# Patient Record
Sex: Female | Born: 1985
Health system: Southern US, Community
[De-identification: ages and names within clinical notes are randomized; demographics above are authoritative.]

## PROBLEM LIST (undated history)

## (undated) ENCOUNTER — Inpatient Hospital Stay (HOSPITAL_COMMUNITY): Payer: Self-pay

## (undated) DIAGNOSIS — B369 Superficial mycosis, unspecified: Secondary | ICD-10-CM

## (undated) DIAGNOSIS — I10 Essential (primary) hypertension: Secondary | ICD-10-CM

## (undated) DIAGNOSIS — K5909 Other constipation: Secondary | ICD-10-CM

## (undated) DIAGNOSIS — K219 Gastro-esophageal reflux disease without esophagitis: Secondary | ICD-10-CM

## (undated) DIAGNOSIS — N200 Calculus of kidney: Secondary | ICD-10-CM

## (undated) DIAGNOSIS — E119 Type 2 diabetes mellitus without complications: Secondary | ICD-10-CM

## (undated) DIAGNOSIS — O24419 Gestational diabetes mellitus in pregnancy, unspecified control: Secondary | ICD-10-CM

## (undated) DIAGNOSIS — H6240 Otitis externa in other diseases classified elsewhere, unspecified ear: Secondary | ICD-10-CM

## (undated) DIAGNOSIS — M2669 Other specified disorders of temporomandibular joint: Secondary | ICD-10-CM

## (undated) DIAGNOSIS — J45909 Unspecified asthma, uncomplicated: Secondary | ICD-10-CM

## (undated) HISTORY — DX: Gestational diabetes mellitus in pregnancy, unspecified control: O24.419

## (undated) HISTORY — DX: Superficial mycosis, unspecified: B36.9

## (undated) HISTORY — DX: Gastro-esophageal reflux disease without esophagitis: K21.9

## (undated) HISTORY — DX: Other specified disorders of temporomandibular joint: M26.69

## (undated) HISTORY — DX: Type 2 diabetes mellitus without complications: E11.9

## (undated) HISTORY — DX: Unspecified asthma, uncomplicated: J45.909

## (undated) HISTORY — DX: Calculus of kidney: N20.0

## (undated) HISTORY — DX: Superficial mycosis, unspecified: H62.40

## (undated) HISTORY — PX: NO PAST SURGERIES: SHX2092

## (undated) HISTORY — DX: Other constipation: K59.09

## (undated) HISTORY — PX: WISDOM TOOTH EXTRACTION: SHX21

---

## 2011-12-15 ENCOUNTER — Emergency Department (HOSPITAL_COMMUNITY)
Admission: EM | Admit: 2011-12-15 | Discharge: 2011-12-15 | Disposition: A | Discharge status: Home / Self Care | Payer: Medicaid Other | Attending: Emergency Medicine | Admitting: Emergency Medicine

## 2011-12-15 ENCOUNTER — Encounter (HOSPITAL_COMMUNITY): Payer: Self-pay | Admitting: Emergency Medicine

## 2011-12-15 DIAGNOSIS — Z3490 Encounter for supervision of normal pregnancy, unspecified, unspecified trimester: Secondary | ICD-10-CM

## 2011-12-15 DIAGNOSIS — O99330 Smoking (tobacco) complicating pregnancy, unspecified trimester: Secondary | ICD-10-CM | POA: Insufficient documentation

## 2011-12-15 DIAGNOSIS — O210 Mild hyperemesis gravidarum: Secondary | ICD-10-CM | POA: Insufficient documentation

## 2011-12-15 DIAGNOSIS — R29898 Other symptoms and signs involving the musculoskeletal system: Secondary | ICD-10-CM | POA: Insufficient documentation

## 2011-12-15 DIAGNOSIS — O99891 Other specified diseases and conditions complicating pregnancy: Secondary | ICD-10-CM | POA: Insufficient documentation

## 2011-12-15 LAB — PREGNANCY, URINE: Preg Test, Ur: POSITIVE — AB

## 2011-12-15 LAB — URINALYSIS, ROUTINE W REFLEX MICROSCOPIC
Ketones, ur: NEGATIVE mg/dL
Nitrite: NEGATIVE
Specific Gravity, Urine: 1.026 (ref 1.005–1.030)
pH: 6 (ref 5.0–8.0)

## 2011-12-15 LAB — URINE MICROSCOPIC-ADD ON

## 2011-12-15 LAB — GLUCOSE, CAPILLARY: Glucose-Capillary: 108 mg/dL — ABNORMAL HIGH (ref 70–99)

## 2011-12-15 NOTE — ED Notes (Signed)
Patient states that she is unable to wait any longer due to childcare issues.  Patient requesting results from urine and discharge.  RN Eme informed.

## 2011-12-15 NOTE — ED Notes (Addendum)
C/o L arm weakness, generalized body weakness, and nausea x 2 weeks.  Denies pain. Pt states she has been dropping things at work with L hand.  Grips strong and equal at present. No neuro deficits noted.

## 2011-12-15 NOTE — ED Notes (Signed)
Called for vitals and didn't answer

## 2011-12-15 NOTE — ED Notes (Signed)
Pt. Alert and oriented x4. Verbalized understanding of discharge instructions.  

## 2011-12-16 NOTE — ED Provider Notes (Signed)
History     CSN: [id number]  Arrival date & time 12/15/11  1629   First MD Initiated Contact with Patient 12/15/11 2201      Chief Complaint  Patient presents with  . Nausea  . Extremity Weakness    (Consider location/radiation/quality/duration/timing/severity/associated sxs/prior treatment) HPI Comments: Pt w no sig PMHx presents to ER c/o intermittent left arm numbness, morning nausea & fatigue w onset 2 weeks ago. Pt denies recent trauma, emesis, abdominal pain, weakness, syncope, fevers, night sweats or chills.   The history is provided by the patient.    History reviewed. No pertinent past medical history.  History reviewed. No pertinent past surgical history.  No family history on file.  History  Substance Use Topics  . Smoking status: Current Every Day Smoker  . Smokeless tobacco: Not on file  . Alcohol Use: No    OB History    Grav Para Term Preterm Abortions TAB SAB Ect Mult Living                  Review of Systems  All other systems reviewed and are negative.    Allergies  Penicillins  Home Medications  No current outpatient prescriptions on file.  BP 132/71  Pulse 79  Temp 98.5 F (36.9 C) (Oral)  Resp 18  SpO2 100%  LMP 10/31/2011  Physical Exam  Constitutional: She is oriented to person, place, and time. She appears well-developed and well-nourished. No distress.  HENT:  Head: Normocephalic and atraumatic.  Mouth/Throat: Oropharynx is clear and moist. No oropharyngeal exudate.  Eyes: Conjunctivae normal and EOM are normal. Pupils are equal, round, and reactive to light. No scleral icterus.  Neck: Normal range of motion. Neck supple. No tracheal deviation present. No thyromegaly present.  Cardiovascular: Normal rate, regular rhythm, normal heart sounds and intact distal pulses.   Pulmonary/Chest: Effort normal and breath sounds normal. No stridor. No respiratory distress. She has no wheezes.  Abdominal: Soft.  Musculoskeletal: Normal  range of motion. She exhibits no edema and no tenderness.  Neurological: She is alert and oriented to person, place, and time. Coordination normal.  Skin: Skin is warm and dry. No rash noted. She is not diaphoretic. No erythema. No pallor.  Psychiatric: She has a normal mood and affect. Her behavior is normal.    ED Course  Procedures (including critical care time)  Labs Reviewed  URINALYSIS, ROUTINE W REFLEX MICROSCOPIC - Abnormal; Notable for the following:    Leukocytes, UA SMALL (*)     All other components within normal limits  PREGNANCY, URINE - Abnormal; Notable for the following:    Preg Test, Ur POSITIVE (*)     All other components within normal limits  GLUCOSE, CAPILLARY - Abnormal; Notable for the following:    Glucose-Capillary 108 (*)     All other components within normal limits  URINE MICROSCOPIC-ADD ON - Abnormal; Notable for the following:    Squamous Epithelial / LPF FEW (*)     Bacteria, UA FEW (*)     All other components within normal limits   No results found.   1. Pregnancy       MDM  Pregnant, results discussed w pt. Advised OBGYN follow up.         Jaci Carrel, [place] 12/16/11 4098

## 2011-12-17 NOTE — ED Provider Notes (Signed)
Medical screening examination/treatment/procedure(s) were performed by non-physician practitioner and as supervising physician I was immediately available for consultation/collaboration.   Tyshawn Keel, MD 12/17/11 1532 

## 2011-12-24 ENCOUNTER — Encounter (HOSPITAL_COMMUNITY): Payer: Self-pay

## 2011-12-24 ENCOUNTER — Inpatient Hospital Stay (HOSPITAL_COMMUNITY): Payer: Medicaid Other

## 2011-12-24 ENCOUNTER — Inpatient Hospital Stay (HOSPITAL_COMMUNITY)
Admission: AD | Admit: 2011-12-24 | Discharge: 2011-12-24 | Disposition: A | Discharge status: Home / Self Care | Payer: Medicaid Other | Source: Ambulatory Visit | Attending: Obstetrics & Gynecology | Admitting: Obstetrics & Gynecology

## 2011-12-24 DIAGNOSIS — A499 Bacterial infection, unspecified: Secondary | ICD-10-CM | POA: Insufficient documentation

## 2011-12-24 DIAGNOSIS — O209 Hemorrhage in early pregnancy, unspecified: Secondary | ICD-10-CM

## 2011-12-24 DIAGNOSIS — O26859 Spotting complicating pregnancy, unspecified trimester: Secondary | ICD-10-CM | POA: Insufficient documentation

## 2011-12-24 DIAGNOSIS — O2390 Unspecified genitourinary tract infection in pregnancy, unspecified trimester: Secondary | ICD-10-CM | POA: Insufficient documentation

## 2011-12-24 DIAGNOSIS — B9689 Other specified bacterial agents as the cause of diseases classified elsewhere: Secondary | ICD-10-CM | POA: Insufficient documentation

## 2011-12-24 DIAGNOSIS — N760 Acute vaginitis: Secondary | ICD-10-CM

## 2011-12-24 LAB — CBC
MCV: 89.4 fL (ref 78.0–100.0)
Platelets: 388 10*3/uL (ref 150–400)
RBC: 4.61 MIL/uL (ref 3.87–5.11)
WBC: 7.9 10*3/uL (ref 4.0–10.5)

## 2011-12-24 LAB — URINALYSIS, ROUTINE W REFLEX MICROSCOPIC
Hgb urine dipstick: NEGATIVE
Nitrite: NEGATIVE
Specific Gravity, Urine: 1.02 (ref 1.005–1.030)
Urobilinogen, UA: 1 mg/dL (ref 0.0–1.0)

## 2011-12-24 LAB — WET PREP, GENITAL
Trich, Wet Prep: NONE SEEN
Yeast Wet Prep HPF POC: NONE SEEN

## 2011-12-24 LAB — ABO/RH: ABO/RH(D): O POS

## 2011-12-24 MED ORDER — METRONIDAZOLE 500 MG PO TABS: 500.0000 mg | ORAL_TABLET | Freq: Two times a day (BID) | ORAL | Status: DC

## 2011-12-24 NOTE — MAU Note (Signed)
Patient states she has been feeling weak and having vomiting for the past 2 days. Has had a positive pregnancy test at [facility] ED. States she had spotting last night but none today and is not wearing a pad. Denies any pain.

## 2011-12-24 NOTE — MAU Provider Note (Signed)
History     CSN: [id number]  Arrival date and time: 12/24/11 1333   None     Chief Complaint  Patient presents with  . Vomiting  . Fatigue   HPI  Pt is [redacted]w[redacted]d pregnant and presents with spotting in pregnancy last night.  She is  feeling weak and has been vomiting for 2 days. Pt had a positive UPT at [facility] ED on 12/16/2011.  Pt denies constipation, diarrhea, UTI symptoms. Pt is not having spotting or cramping at this time.  No past medical history on file.  No past surgical history on file.  No family history on file.  History  Substance Use Topics  . Smoking status: Current Every Day Smoker  . Smokeless tobacco: Not on file  . Alcohol Use: No    Allergies:  Allergies  Allergen Reactions  . Penicillins Hives and Swelling    No prescriptions prior to admission    ROS Physical Exam   Blood pressure 125/79, pulse 82, temperature 99.5 F (37.5 C), temperature source Oral, resp. rate 18, height 5\' 8"  (1.727 m), weight 122.653 kg (270 lb 6.4 oz), last menstrual period 10/12/2011, SpO2 100.00%.  Physical Exam  Nursing note and vitals reviewed. Constitutional: She is oriented to person, place, and time. She appears well-developed and well-nourished.  HENT:  Head: Normocephalic.  Eyes: Pupils are equal, round, and reactive to light.  Neck: Normal range of motion. Neck supple.  Cardiovascular: Normal rate.   Respiratory: Effort normal.  GI: Soft. She exhibits no distension. There is no tenderness. There is no rebound and no guarding.  Genitourinary:       Mod amount of frothy white discharge in vault; cervix clean nontender, closed; uterus and adnexal without tenderness or palpable enlargement - difficult to assess due to habitus  Musculoskeletal: Normal range of motion.  Neurological: She is alert and oriented to person, place, and time.  Skin: Skin is warm and dry.  Psychiatric: She has a normal mood and affect.    MAU Course  Procedures     Results for  orders placed during the hospital encounter of 12/24/11 (from the past 24 hour(s))  CBC     Status: Normal   Collection Time   12/24/11  1:55 PM      Component Value Range   WBC 7.9  4.0 - 10.5 K/uL   RBC 4.61  3.87 - 5.11 MIL/uL   Hemoglobin 14.1  12.0 - 15.0 g/dL   HCT 62.9  52.8 - 41.3 %   MCV 89.4  78.0 - 100.0 fL   MCH 30.6  26.0 - 34.0 pg   MCHC 34.2  30.0 - 36.0 g/dL   RDW 24.4  01.0 - 27.2 %   Platelets 388  150 - 400 K/uL  URINALYSIS, ROUTINE W REFLEX MICROSCOPIC     Status: Normal   Collection Time   12/24/11  2:00 PM      Component Value Range   Color, Urine YELLOW  YELLOW   APPearance CLEAR  CLEAR   Specific Gravity, Urine 1.020  1.005 - 1.030   pH 6.0  5.0 - 8.0   Glucose, UA NEGATIVE  NEGATIVE mg/dL   Hgb urine dipstick NEGATIVE  NEGATIVE   Bilirubin Urine NEGATIVE  NEGATIVE   Ketones, ur NEGATIVE  NEGATIVE mg/dL   Protein, ur NEGATIVE  NEGATIVE mg/dL   Urobilinogen, UA 1.0  0.0 - 1.0 mg/dL   Nitrite NEGATIVE  NEGATIVE   Leukocytes, UA NEGATIVE  NEGATIVE  ABO/RH     Status: Normal   Collection Time   12/24/11  2:00 PM      Component Value Range   ABO/RH(D) O POS    HCG, QUANTITATIVE, PREGNANCY     Status: Abnormal   Collection Time   12/24/11  2:00 PM      Component Value Range   hCG, Beta Chain, Quant, S 44196 (*) <5 mIU/mL  WET PREP, GENITAL     Status: Abnormal   Collection Time   12/24/11  4:59 PM      Component Value Range   Yeast Wet Prep HPF POC NONE SEEN  NONE SEEN   Trich, Wet Prep NONE SEEN  NONE SEEN   Clue Cells Wet Prep HPF POC MANY (*) NONE SEEN   WBC, Wet Prep HPF POC MANY (*) NONE SEEN    Assessment and Plan  Single living IUP [redacted]w[redacted]d Subchorionic hemorrhage Bleeding in pregnancy Bacterial vaginosis- prescription for Flagyl 500mg  BID for 7 days F/u for OB care with provider of choice  Shalon Councilman 12/24/2011, 4:12 PM

## 2012-06-13 ENCOUNTER — Emergency Department (HOSPITAL_COMMUNITY)
Admission: EM | Admit: 2012-06-13 | Discharge: 2012-06-13 | Disposition: A | Discharge status: Home / Self Care | Payer: Medicaid Other | Attending: Emergency Medicine | Admitting: Emergency Medicine

## 2012-06-13 ENCOUNTER — Emergency Department (HOSPITAL_COMMUNITY): Payer: Medicaid Other

## 2012-06-13 ENCOUNTER — Encounter (HOSPITAL_COMMUNITY): Payer: Self-pay | Admitting: *Deleted

## 2012-06-13 DIAGNOSIS — Z23 Encounter for immunization: Secondary | ICD-10-CM | POA: Insufficient documentation

## 2012-06-13 DIAGNOSIS — Y9389 Activity, other specified: Secondary | ICD-10-CM | POA: Insufficient documentation

## 2012-06-13 DIAGNOSIS — Y929 Unspecified place or not applicable: Secondary | ICD-10-CM | POA: Insufficient documentation

## 2012-06-13 DIAGNOSIS — S6991XA Unspecified injury of right wrist, hand and finger(s), initial encounter: Secondary | ICD-10-CM

## 2012-06-13 DIAGNOSIS — S6990XA Unspecified injury of unspecified wrist, hand and finger(s), initial encounter: Secondary | ICD-10-CM | POA: Insufficient documentation

## 2012-06-13 DIAGNOSIS — F17200 Nicotine dependence, unspecified, uncomplicated: Secondary | ICD-10-CM | POA: Insufficient documentation

## 2012-06-13 DIAGNOSIS — W268XXA Contact with other sharp object(s), not elsewhere classified, initial encounter: Secondary | ICD-10-CM | POA: Insufficient documentation

## 2012-06-13 MED ORDER — SULFAMETHOXAZOLE-TRIMETHOPRIM 800-160 MG PO TABS: 1.0000 | ORAL_TABLET | Freq: Two times a day (BID) | ORAL | Status: AC

## 2012-06-13 MED ORDER — TETANUS-DIPHTH-ACELL PERTUSSIS 5-2.5-18.5 LF-MCG/0.5 IM SUSP
0.5000 mL | Freq: Once | INTRAMUSCULAR | Status: AC
  Administered 2012-06-13: 0.5 mL via INTRAMUSCULAR
  Filled 2012-06-13: qty 0.5

## 2012-06-13 NOTE — ED Notes (Signed)
Patient transported to X-ray 

## 2012-06-13 NOTE — ED Provider Notes (Signed)
History     CSN: [id number]  Arrival date & time 06/13/12  2004   None     Chief Complaint  Patient presents with  . Hand Pain    (Consider location/radiation/quality/duration/timing/severity/associated sxs/prior treatment) HPI  Patient is a 27 yo F presenting for right middle finger swelling after cutting her finger on scrap metal three weeks ago. Patient states she cleaned out the cut and it appeared to be healing well until the other day when it became painful and swollen. Concerned for foreign body in finger. Patient denies decreased movement with finger, warmth, temperature change, numbness, tingling in the right middle finger. Denies previous injury to finger. Patient is not UTD for tetanus. Patient denies fevers, chills, headache, myalgias, nausea, vomiting, or diarrhea.    Past Medical History  Diagnosis Date  . No pertinent past medical history     Past Surgical History  Procedure Laterality Date  . No past surgeries      No family history on file.  History  Substance Use Topics  . Smoking status: Current Every Day Smoker -- 0.50 packs/day    Types: Cigarettes  . Smokeless tobacco: Not on file  . Alcohol Use: No    OB History   Grav Para Term Preterm Abortions TAB SAB Ect Mult Living   3 1 1  1 1    1       Review of Systems  Constitutional: Negative.   HENT: Negative.   Eyes: Negative.   Respiratory: Negative.   Cardiovascular: Negative.   Gastrointestinal: Negative.   Musculoskeletal: Negative.   Skin: Positive for wound.  Neurological: Negative.     Allergies  Penicillins  Home Medications   Current Outpatient Rx  Name  Route  Sig  Dispense  Refill  . acetaminophen (TYLENOL) 500 MG tablet   Oral   Take 1,000 mg by mouth every 6 (six) hours as needed for pain.           BP 121/71  Pulse 61  Temp(Src) 97.7 F (36.5 C) (Oral)  Resp 16  SpO2 100%  LMP 06/12/2012  Breastfeeding? Unknown  Physical Exam  Constitutional: She is  oriented to person, place, and time. She appears well-developed. No distress.  HENT:  Head: Normocephalic and atraumatic.  Eyes: Conjunctivae are normal.  Neck: Neck supple.  Cardiovascular: Normal rate, regular rhythm and normal heart sounds.   Pulses:      Radial pulses are 2+ on the right side, and 2+ on the left side.  Pulmonary/Chest: Effort normal and breath sounds normal.  Abdominal: Soft.  Musculoskeletal:  Right middle finger with minimal swelling, w/o erythema, no temperature difference between middle finger and rest of hand. Full ROM intact. Cap refill < 2 sec. No open wound.   Neurological: She is alert and oriented to person, place, and time.  Skin: Skin is warm and dry. She is not diaphoretic.    ED Course  Procedures (including critical care time) Medications  TDaP (BOOSTRIX) injection 0.5 mL (0.5 mLs Intramuscular Given 06/13/12 2109)     Labs Reviewed - No data to display Dg Finger Middle Right  06/13/2012  *RADIOLOGY REPORT*  Clinical Data: Middle finger pain  RIGHT MIDDLE FINGER 2+V  Comparison: None.  Findings: Three views of the right middle finger submitted.  No acute fracture or subluxation.  IMPRESSION: No acute fracture or subluxation.   Original Report Authenticated By: Natasha Mead, M.D.      1. Hand injury, right, initial encounter  MDM  Patient is a 27 yo F presenting for painful right middle finger after injuring finger three weeks ago cutting her finger on metal. Tetanus not UTD, corrected at this appointment. PE revealed no neurovascular compromise, no open wound, no erythema or drainage at site of wound. Pt started on Abx given MOI with scrap metal. Advised to follow up with PCP and hand surgeon. Patient agreeable to plan. Patient is stable at time of discharge         Jeannetta Ellis, PA-C 06/14/12 1022

## 2012-06-13 NOTE — ED Notes (Signed)
The pt cut her rt middle finger on a piece of metal 3 weeks ago.  She thinks there may be something in it .  Painful and swollen

## 2012-06-15 NOTE — ED Provider Notes (Signed)
Medical screening examination/treatment/procedure(s) were performed by non-physician practitioner and as supervising physician I was immediately available for consultation/collaboration.   Laray Anger, DO 06/15/12 1454

## 2012-06-26 ENCOUNTER — Emergency Department (HOSPITAL_COMMUNITY)
Admission: EM | Admit: 2012-06-26 | Discharge: 2012-06-26 | Disposition: A | Discharge status: Home / Self Care | Payer: Medicaid Other | Attending: Emergency Medicine | Admitting: Emergency Medicine

## 2012-06-26 ENCOUNTER — Encounter (HOSPITAL_COMMUNITY): Payer: Self-pay | Admitting: *Deleted

## 2012-06-26 DIAGNOSIS — R197 Diarrhea, unspecified: Secondary | ICD-10-CM | POA: Insufficient documentation

## 2012-06-26 DIAGNOSIS — F17200 Nicotine dependence, unspecified, uncomplicated: Secondary | ICD-10-CM | POA: Insufficient documentation

## 2012-06-26 DIAGNOSIS — J029 Acute pharyngitis, unspecified: Secondary | ICD-10-CM | POA: Insufficient documentation

## 2012-06-26 DIAGNOSIS — J069 Acute upper respiratory infection, unspecified: Secondary | ICD-10-CM | POA: Insufficient documentation

## 2012-06-26 DIAGNOSIS — R112 Nausea with vomiting, unspecified: Secondary | ICD-10-CM | POA: Insufficient documentation

## 2012-06-26 MED ORDER — IBUPROFEN 800 MG PO TABS: 800.0000 mg | ORAL_TABLET | Freq: Three times a day (TID) | ORAL | Status: DC

## 2012-06-26 MED ORDER — PROMETHAZINE HCL 25 MG PO TABS: ORAL_TABLET | ORAL | Status: DC

## 2012-06-26 NOTE — ED Provider Notes (Signed)
History    This chart was scribed for Junius Finner, PA working with Juliet Rude. Rubin Payor, MD by ED Scribe, Burman Nieves. This patient was seen in room TR07C/TR07C and the patient's care was started at 4:49 PM.  CSN: [id number]  Arrival date & time 06/26/12  1333   First MD Initiated Contact with Patient 06/26/12 1649      Chief Complaint  Patient presents with  . Cough    (Consider location/radiation/quality/duration/timing/severity/associated sxs/prior treatment) Patient is a 27 y.o. female presenting with cough. The history is provided by the patient. No language interpreter was used.  Cough  Amy Church is a 27 y.o. female who presents to the Emergency Department complaining of moderate intermittent productive cough with associated nasal and throat congestion, diarrhea, ear itching, and episodes of emesis onset 3 days ago. Pt states she has been vomiting phlegm that has a yellow appearance. She has been waking up at night "with the sweats". She states that eating triggers her episodes of emesis and can not recall how many times she has vomited. Pt states she has been taking Mucinex since sx's started with no immediate relief (Last dose was last night at 9PM). Pt denies fever, chills, SOB, weakness, and any other associated symptoms. Pt denies taking any daily medications and is allergic to penicillin. Pt is a current everyday tobacco smoker (0.5 pack/day).   Past Medical History  Diagnosis Date  . No pertinent past medical history     Past Surgical History  Procedure Laterality Date  . No past surgeries      History reviewed. No pertinent family history.  History  Substance Use Topics  . Smoking status: Current Every Day Smoker -- 0.50 packs/day    Types: Cigarettes  . Smokeless tobacco: Not on file  . Alcohol Use: No    OB History   Grav Para Term Preterm Abortions TAB SAB Ect Mult Living   3 1 1  1 1    1       Review of Systems  HENT: Positive for congestion.    Respiratory: Positive for cough.   Gastrointestinal: Positive for nausea and vomiting.  All other systems reviewed and are negative.    Allergies  Penicillins  Home Medications   Current Outpatient Rx  Name  Route  Sig  Dispense  Refill  . acetaminophen (TYLENOL) 500 MG tablet   Oral   Take 1,000 mg by mouth every 6 (six) hours as needed for pain.         Marland Kitchen dextromethorphan-guaiFENesin (MUCINEX DM) 30-600 MG per 12 hr tablet   Oral   Take 1 tablet by mouth every 12 (twelve) hours.         Marland Kitchen ibuprofen (ADVIL,MOTRIN) 800 MG tablet   Oral   Take 1 tablet (800 mg total) by mouth 3 (three) times daily.   21 tablet   0   . promethazine (PHENERGAN) 25 MG tablet      Take 1 tablet every 6-8hrs as needed for nausea.   30 tablet   0     BP 122/92  Pulse 80  Temp(Src) 98.3 F (36.8 C) (Oral)  Resp 18  SpO2 98%  LMP 06/26/2012  Breastfeeding? No  Physical Exam  Nursing note and vitals reviewed. Constitutional: She is oriented to person, place, and time. She appears well-developed and well-nourished. No distress.  HENT:  Head: Normocephalic and atraumatic.  Right Ear: External ear normal.  Left Ear: External ear normal.  Mouth/Throat: Uvula is midline  and mucous membranes are normal. No dental abscesses or edematous. Posterior oropharyngeal erythema present. No oropharyngeal exudate, posterior oropharyngeal edema or tonsillar abscesses.  Eyes: EOM are normal.  Neck: Neck supple. No tracheal deviation present.  Cardiovascular: Normal rate and normal heart sounds.   Pulmonary/Chest: Effort normal and breath sounds normal. No respiratory distress.  Abdominal: Soft. Bowel sounds are normal. She exhibits no mass. There is no tenderness. There is no guarding.  Musculoskeletal: Normal range of motion.  Neurological: She is alert and oriented to person, place, and time.  Skin: Skin is warm and dry.  Psychiatric: She has a normal mood and affect. Her behavior is normal.     ED Course  Procedures (including critical care time) DIAGNOSTIC STUDIES: Oxygen Saturation is 98% on room air, normal by my interpretation.    COORDINATION OF CARE: 5:09 PM Discussed ED treatment with pt and pt agrees.     Labs Reviewed - No data to display No results found.   1. Acute URI       MDM  Pt presented with URI symptoms x4-5 days.  Denies fever.  Reports loose stools and nausea.  Denied hx of asthma. PE: lungs CTAB. Rx: phenergan and ibuprofen.  Advised to f/u with PCP (contact info given) or [facility] for non-emergent healthcare concerns.    I personally performed the services described in this documentation, which was scribed in my presence. The recorded information has been reviewed and is accurate.         Junius Finner, PA-C 06/26/12 1742

## 2012-06-26 NOTE — ED Notes (Signed)
Pt states she has had a sore throat and head congestion since Friday, took Mucinex at home without relief.  Pt children have been sick with similar symptoms at home.  Pt also complaining of diarrhea.

## 2012-06-26 NOTE — ED Notes (Signed)
Pt states she has a cough, stuffy nose, throat and ear itching, vomiting since Friday. Mom has been taking mucinex and the last dose was last night at 2100. It did seem to help.

## 2012-06-27 NOTE — ED Provider Notes (Signed)
Medical screening examination/treatment/procedure(s) were performed by non-physician practitioner and as supervising physician I was immediately available for consultation/collaboration.  Ronesha Heenan R. Francine Hannan, MD 06/27/12 0017 

## 2012-08-16 ENCOUNTER — Emergency Department (HOSPITAL_COMMUNITY)
Admission: EM | Admit: 2012-08-16 | Discharge: 2012-08-17 | Disposition: A | Discharge status: Home / Self Care | Payer: Medicaid Other | Attending: Emergency Medicine | Admitting: Emergency Medicine

## 2012-08-16 ENCOUNTER — Encounter (HOSPITAL_COMMUNITY): Payer: Self-pay | Admitting: Family Medicine

## 2012-08-16 ENCOUNTER — Emergency Department (HOSPITAL_COMMUNITY): Payer: Medicaid Other

## 2012-08-16 DIAGNOSIS — M25561 Pain in right knee: Secondary | ICD-10-CM

## 2012-08-16 DIAGNOSIS — F17200 Nicotine dependence, unspecified, uncomplicated: Secondary | ICD-10-CM | POA: Insufficient documentation

## 2012-08-16 DIAGNOSIS — M25569 Pain in unspecified knee: Secondary | ICD-10-CM | POA: Insufficient documentation

## 2012-08-16 MED ORDER — OXYCODONE-ACETAMINOPHEN 5-325 MG PO TABS: 1.0000 | ORAL_TABLET | Freq: Four times a day (QID) | ORAL | Status: DC | PRN

## 2012-08-16 MED ORDER — IBUPROFEN 600 MG PO TABS: 600.0000 mg | ORAL_TABLET | Freq: Four times a day (QID) | ORAL | Status: DC | PRN

## 2012-08-16 NOTE — ED Provider Notes (Signed)
History     CSN: [id number]  Arrival date & time 08/16/12  1839   First MD Initiated Contact with Patient 08/16/12 2045      Chief Complaint  Patient presents with  . Knee Pain    (Consider location/radiation/quality/duration/timing/severity/associated sxs/prior treatment) Patient is a 27 y.o. female presenting with knee pain.  Knee Pain Associated symptoms: no back pain, no fatigue and no fever    patient presents with knee pain for the last several days. She states she previously had a bump on the inside of her right knee. She states that the bump was in the skin. She states that bump is now gone it has moved the pain to the front of her knee. Is worse with standing and walking. No trauma. No numbness or weakness. No other injury. No difficulty breathing. Congestion. No fevers. No cough.  Past Medical History  Diagnosis Date  . No pertinent past medical history     Past Surgical History  Procedure Laterality Date  . No past surgeries      History reviewed. No pertinent family history.  History  Substance Use Topics  . Smoking status: Current Every Day Smoker -- 0.50 packs/day    Types: Cigarettes  . Smokeless tobacco: Not on file  . Alcohol Use: No    OB History   Grav Para Term Preterm Abortions TAB SAB Ect Mult Living   3 1 1  1 1    1       Review of Systems  Constitutional: Negative for fever and fatigue.  Respiratory: Negative for cough.   Musculoskeletal: Negative for back pain, joint swelling, arthralgias and gait problem.  Skin: Negative for rash and wound.    Allergies  Penicillins  Home Medications   Current Outpatient Rx  Name  Route  Sig  Dispense  Refill  . medroxyPROGESTERone (DEPO-PROVERA) 150 MG/ML injection   Intramuscular   Inject 150 mg into the muscle every 3 (three) months.         Marland Kitchen ibuprofen (ADVIL,MOTRIN) 600 MG tablet   Oral   Take 1 tablet (600 mg total) by mouth every 6 (six) hours as needed for pain.   20 tablet   0    . oxyCODONE-acetaminophen (PERCOCET/ROXICET) 5-325 MG per tablet   Oral   Take 1-2 tablets by mouth every 6 (six) hours as needed for pain.   10 tablet   0     BP 119/73  Pulse 82  Temp(Src) 97.9 F (36.6 C) (Oral)  Resp 16  SpO2 100%  LMP 10/12/2011  Physical Exam  Constitutional: She is oriented to person, place, and time. She appears well-developed and well-nourished.  Patient is obese  HENT:  Head: Atraumatic.  Eyes: Pupils are equal, round, and reactive to light.  Neck: Neck supple.  Pulmonary/Chest: Effort normal and breath sounds normal.  Abdominal: Soft.  Musculoskeletal: She exhibits tenderness.  Tenderness over lower end of right patella. No erythema. No induration. No effusion. Neurovascular intact distally. Strength intact at knee.  Neurological: She is alert and oriented to person, place, and time.  Skin: Skin is warm. No erythema.    ED Course  Procedures (including critical care time)  Labs Reviewed - No data to display Dg Knee Complete 4 Views Right  08/16/2012   *RADIOLOGY REPORT*  Clinical Data: Right-sided knee pain.  RIGHT KNEE - COMPLETE 4+ VIEW  Comparison: No priors.  Findings: Four views of the right knee demonstrate no acute displaced fracture, subluxation, dislocation, joint  or soft tissue abnormality.  IMPRESSION: 1.  No acute radiographic abnormality of the right knee.   Original Report Authenticated By: Trudie Reed, M.D.     1. Knee pain, acute, right       MDM  Patient with knee pain. Doubt DVT. X-ray reassuring. Will followup with Ortho as needed        Juliet Rude. Rubin Payor, MD 08/17/12 [phone]

## 2012-08-16 NOTE — ED Notes (Signed)
Pt pt sts knot in thigh that has moved to knee. sts very painful. sts knot is on the inside.

## 2012-10-21 ENCOUNTER — Encounter (HOSPITAL_COMMUNITY): Payer: Self-pay | Admitting: *Deleted

## 2012-10-21 DIAGNOSIS — Z79899 Other long term (current) drug therapy: Secondary | ICD-10-CM | POA: Insufficient documentation

## 2012-10-21 DIAGNOSIS — I10 Essential (primary) hypertension: Secondary | ICD-10-CM | POA: Insufficient documentation

## 2012-10-21 DIAGNOSIS — F17200 Nicotine dependence, unspecified, uncomplicated: Secondary | ICD-10-CM | POA: Insufficient documentation

## 2012-10-21 DIAGNOSIS — R51 Headache: Secondary | ICD-10-CM | POA: Insufficient documentation

## 2012-10-21 NOTE — ED Notes (Signed)
Pt in c/o high blood pressure and dizziness x1 week with headache, states she does not have a history of hypertension but when she was having the dizziness and headache she started checking her BP at home and it has been elevated all week

## 2012-10-22 ENCOUNTER — Emergency Department (HOSPITAL_COMMUNITY)
Admission: EM | Admit: 2012-10-22 | Discharge: 2012-10-22 | Disposition: A | Discharge status: Home / Self Care | Payer: Medicaid Other | Attending: Emergency Medicine | Admitting: Emergency Medicine

## 2012-10-22 LAB — POCT I-STAT, CHEM 8
Calcium, Ion: 1.15 mmol/L (ref 1.12–1.23)
Chloride: 105 mEq/L (ref 96–112)
Glucose, Bld: 123 mg/dL — ABNORMAL HIGH (ref 70–99)
HCT: 43 % (ref 36.0–46.0)
Hemoglobin: 14.6 g/dL (ref 12.0–15.0)
TCO2: 24 mmol/L (ref 0–100)

## 2012-10-22 MED ORDER — DEXAMETHASONE SODIUM PHOSPHATE 4 MG/ML IJ SOLN
10.0000 mg | Freq: Once | INTRAMUSCULAR | Status: DC
  Filled 2012-10-22: qty 3

## 2012-10-22 MED ORDER — SODIUM CHLORIDE 0.9 % IV BOLUS (SEPSIS)
1000.0000 mL | Freq: Once | INTRAVENOUS | Status: AC
  Administered 2012-10-22: 1000 mL via INTRAVENOUS

## 2012-10-22 MED ORDER — METOCLOPRAMIDE HCL 5 MG/ML IJ SOLN
10.0000 mg | Freq: Once | INTRAMUSCULAR | Status: DC
  Filled 2012-10-22: qty 2

## 2012-10-22 MED ORDER — DIPHENHYDRAMINE HCL 50 MG/ML IJ SOLN
25.0000 mg | Freq: Once | INTRAMUSCULAR | Status: DC
  Filled 2012-10-22: qty 1

## 2012-10-22 MED ORDER — IBUPROFEN 800 MG PO TABS: 800.0000 mg | ORAL_TABLET | Freq: Three times a day (TID) | ORAL | Status: DC

## 2012-10-22 MED ORDER — ACETAMINOPHEN 325 MG PO TABS
650.0000 mg | ORAL_TABLET | Freq: Once | ORAL | Status: AC
  Administered 2012-10-22: 650 mg via ORAL
  Filled 2012-10-22: qty 2

## 2012-10-22 NOTE — Discharge Instructions (Signed)
General Headache Without Cause A headache is pain or discomfort felt around the head or neck area. The specific cause of a headache may not be found. There are many causes and types of headaches. A few common ones are:  Tension headaches.  Migraine headaches.  Cluster headaches.  Chronic daily headaches. HOME CARE INSTRUCTIONS   Keep all follow-up appointments with your caregiver or any specialist referral.  Only take over-the-counter or prescription medicines for pain or discomfort as directed by your caregiver.  Lie down in a dark, quiet room when you have a headache.  Keep a headache journal to find out what may trigger your migraine headaches. For example, write down:  What you eat and drink.  How much sleep you get.  Any change to your diet or medicines.  Try massage or other relaxation techniques.  Put ice packs or heat on the head and neck. Use these 3 to 4 times per day for 15 to 20 minutes each time, or as needed.  Limit stress.  Sit up straight, and do not tense your muscles.  Quit smoking if you smoke.  Limit alcohol use.  Decrease the amount of caffeine you drink, or stop drinking caffeine.  Eat and sleep on a regular schedule.  Get 7 to 9 hours of sleep, or as recommended by your caregiver.  Keep lights dim if bright lights bother you and make your headaches worse. SEEK MEDICAL CARE IF:   You have problems with the medicines you were prescribed.  Your medicines are not working.  You have a change from the usual headache.  You have nausea or vomiting. SEEK IMMEDIATE MEDICAL CARE IF:   Your headache becomes severe.  You have a fever.  You have a stiff neck.  You have loss of vision.  You have muscular weakness or loss of muscle control.  You start losing your balance or have trouble walking.  You feel faint or pass out.  You have severe symptoms that are different from your first symptoms. MAKE SURE YOU:   Understand these  instructions.  Will watch your condition.  Will get help right away if you are not doing well or get worse. Document Released: 02/15/2005 Document Revised: 05/10/2011 Document Reviewed: 03/03/2011 [employer] Patient Information 2014 [employer].  

## 2012-10-22 NOTE — ED Notes (Signed)
Pt states she had left sided CP and SOB when she came into the ER but now it has gone away and currently denies CP at this moment.

## 2012-10-22 NOTE — ED Provider Notes (Signed)
CSN: [id number]     Arrival date & time 10/21/12  2136 History     First MD Initiated Contact with Patient 10/22/12 0041     Chief Complaint  Patient presents with  . Hypertension  . Chest Pain   (Consider location/radiation/quality/duration/timing/severity/associated sxs/prior Treatment) HPI Hx per PT  - elevated BP at home for the last week.  She has been very anxious about this, it causes her to get HAs and sometimes sharp CP and SOB/ anxiety. Right now she has some HA but denies any CP or SOB. No ABD pain, no trouble urinating. np PCP and no h/o HTN. No leg pain or swelling.   HAs are frontal, she gets them daily, LMP months ago - is on depo Past Medical History  Diagnosis Date  . No pertinent past medical history    Past Surgical History  Procedure Laterality Date  . No past surgeries     History reviewed. No pertinent family history. History  Substance Use Topics  . Smoking status: Current Every Day Smoker -- 0.50 packs/day    Types: Cigarettes  . Smokeless tobacco: Not on file  . Alcohol Use: No   OB History   Grav Para Term Preterm Abortions TAB SAB Ect Mult Living   3 1 1  1 1    1      Review of Systems  Constitutional: Negative for fever and chills.  HENT: Negative for neck pain and neck stiffness.   Eyes: Negative for visual disturbance.  Respiratory: Negative for cough and wheezing.   Cardiovascular: Positive for chest pain.  Gastrointestinal: Negative for abdominal pain.  Genitourinary: Negative for dysuria.  Musculoskeletal: Negative for back pain.  Skin: Negative for rash.  Neurological: Positive for headaches. Negative for weakness and numbness.  All other systems reviewed and are negative.    Allergies  Penicillins  Home Medications   Current Outpatient Rx  Name  Route  Sig  Dispense  Refill  . Lurasidone HCl (LATUDA) 20 MG TABS   Oral   Take 10 mg by mouth daily.         . medroxyPROGESTERone (DEPO-PROVERA) 150 MG/ML injection  Intramuscular   Inject 150 mg into the muscle every 3 (three) months.          BP 156/109  Pulse 101  Temp(Src) 98 F (36.7 C) (Oral)  Resp 20  Wt 270 lb (122.471 kg)  BMI 41.06 kg/m2  SpO2 100%  LMP 10/12/2011 Physical Exam  Constitutional: She is oriented to person, place, and time. She appears well-developed and well-nourished.  HENT:  Head: Normocephalic and atraumatic.  Eyes: Conjunctivae and EOM are normal. Pupils are equal, round, and reactive to light.  Neck: Normal range of motion. Neck supple.  Cardiovascular: Regular rhythm and intact distal pulses.   Borderline tachycardic  Pulmonary/Chest: Effort normal and breath sounds normal. No stridor. No respiratory distress. She exhibits no tenderness.  Abdominal: Soft. Bowel sounds are normal. She exhibits no distension. There is no tenderness.  Musculoskeletal: Normal range of motion. She exhibits no edema.  Neurological: She is alert and oriented to person, place, and time. She displays normal reflexes. No cranial nerve deficit. She exhibits normal muscle tone. Coordination normal.  Skin: Skin is warm and dry.    ED Course   Procedures (including critical care time)  Results for orders placed during the hospital encounter of 10/22/12  D-DIMER, QUANTITATIVE      Result Value Range   D-Dimer, Quant <0.27  0.00 -  0.48 ug/mL-FEU  POCT I-STAT, CHEM 8      Result Value Range   Sodium 141  135 - 145 mEq/L   Potassium 3.5  3.5 - 5.1 mEq/L   Chloride 105  96 - 112 mEq/L   BUN 13  6 - 23 mg/dL   Creatinine, Ser 1.61  0.50 - 1.10 mg/dL   Glucose, Bld 096 (*) 70 - 99 mg/dL   Calcium, Ion 0.45  4.09 - 1.23 mmol/L   TCO2 24  0 - 100 mmol/L   Hemoglobin 14.6  12.0 - 15.0 g/dL   HCT 81.1  91.4 - 78.2 %  POCT I-STAT TROPONIN I      Result Value Range   Troponin i, poc 0.01  0.00 - 0.08 ng/mL   Comment 3                Date: 10/22/2012  Rate: 95  Rhythm: normal sinus rhythm  QRS Axis: normal  Intervals: normal  ST/T  Wave abnormalities: nonspecific ST changes  Conduction Disutrbances:none  Narrative Interpretation:   Old EKG Reviewed: none available  Filed Vitals:   10/22/12 0116  BP: 118/80  Pulse: 102  Temp:   Resp:    Tachycardia otherwise normal exam without neuro deficits or indication for emergent imaging of brain. Labs reviewed as above. Negative d-dimer  I offered patient an IV fluid bolus and IV headache cocktail and she declined, prefers Tylenol  2:23 AM on recheck headache is improved. She has not had any chest pain or difficulty breathing in the ED. Blood pressure improved. No indication for further workup at this time in the emergency department.  Plan discharge home with outpatient followup. Patient agrees to return precautions and followup primary care physician.  MDM  Elevated blood pressure at home with multiple other complaints Evaluated with EKG and labs Tylenol provided Condition improved Vital signs and nursing notes reviewed and considered   Sunnie Nielsen, MD 10/22/12 0225

## 2013-12-31 ENCOUNTER — Encounter (HOSPITAL_COMMUNITY): Payer: Self-pay | Admitting: *Deleted

## 2014-04-29 ENCOUNTER — Emergency Department (HOSPITAL_COMMUNITY)
Admission: EM | Admit: 2014-04-29 | Discharge: 2014-04-29 | Disposition: A | Discharge status: Home / Self Care | Payer: Medicaid Other | Attending: Emergency Medicine | Admitting: Emergency Medicine

## 2014-04-29 ENCOUNTER — Encounter (HOSPITAL_COMMUNITY): Payer: Self-pay | Admitting: *Deleted

## 2014-04-29 DIAGNOSIS — Z791 Long term (current) use of non-steroidal anti-inflammatories (NSAID): Secondary | ICD-10-CM | POA: Diagnosis not present

## 2014-04-29 DIAGNOSIS — N61 Inflammatory disorders of breast: Secondary | ICD-10-CM | POA: Insufficient documentation

## 2014-04-29 DIAGNOSIS — Z720 Tobacco use: Secondary | ICD-10-CM | POA: Insufficient documentation

## 2014-04-29 DIAGNOSIS — N611 Abscess of the breast and nipple: Secondary | ICD-10-CM

## 2014-04-29 DIAGNOSIS — Z79899 Other long term (current) drug therapy: Secondary | ICD-10-CM | POA: Diagnosis not present

## 2014-04-29 DIAGNOSIS — Z8614 Personal history of Methicillin resistant Staphylococcus aureus infection: Secondary | ICD-10-CM | POA: Insufficient documentation

## 2014-04-29 DIAGNOSIS — Z0189 Encounter for other specified special examinations: Secondary | ICD-10-CM

## 2014-04-29 DIAGNOSIS — Z7689 Persons encountering health services in other specified circumstances: Secondary | ICD-10-CM

## 2014-04-29 DIAGNOSIS — Z880 Allergy status to penicillin: Secondary | ICD-10-CM | POA: Diagnosis not present

## 2014-04-29 MED ORDER — DOXYCYCLINE HYCLATE 100 MG PO CAPS: 100.0000 mg | ORAL_CAPSULE | Freq: Two times a day (BID) | ORAL | Status: DC

## 2014-04-29 MED ORDER — OXYCODONE-ACETAMINOPHEN 5-325 MG PO TABS: 1.0000 | ORAL_TABLET | ORAL | Status: DC | PRN

## 2014-04-29 MED ORDER — OXYCODONE-ACETAMINOPHEN 5-325 MG PO TABS: 2.0000 | ORAL_TABLET | ORAL | Status: DC | PRN

## 2014-04-29 MED ORDER — LIDOCAINE-EPINEPHRINE (PF) 2 %-1:200000 IJ SOLN
10.0000 mL | Freq: Once | INTRAMUSCULAR | Status: AC
  Administered 2014-04-29: 10 mL
  Filled 2014-04-29: qty 20

## 2014-04-29 NOTE — ED Provider Notes (Addendum)
CSN: [id number]     Arrival date & time 04/29/14  4098 History   First MD Initiated Contact with Patient 04/29/14 0745     Chief Complaint  Patient presents with  . Abscess     (Consider location/radiation/quality/duration/timing/severity/associated sxs/prior Treatment) Patient is a 29 y.o. female presenting with abscess.  Abscess  29 year old female coming in today complaining of area of tenderness and swelling under left breast that has been there for 3 weeks. It is slightly larger and more painful now. She feels like it should be draining but she has not noted any drainage. She also has a bump on her left inner thigh dysmenorrhea for 6 months. She states she did have MRSA in the past and she was coming in because she was concerned this was MRSA again. She is unable tell me the date or how she was treated. She does say that she completed her medications. She has not had fever, chills, trauma, weight loss or weight gain. Past Medical History  Diagnosis Date  . No pertinent past medical history    Past Surgical History  Procedure Laterality Date  . No past surgeries     No family history on file. History  Substance Use Topics  . Smoking status: Current Every Day Smoker -- 0.50 packs/day    Types: Cigarettes  . Smokeless tobacco: Not on file  . Alcohol Use: No   OB History    Gravida Para Term Preterm AB TAB SAB Ectopic Multiple Living   Review of Systems  All other systems reviewed and are negative.     Allergies  Penicillins  Home Medications   Prior to Admission medications   Medication Sig Start Date End Date Taking? Authorizing Provider  ibuprofen (ADVIL,MOTRIN) 800 MG tablet Take 1 tablet (800 mg total) by mouth 3 (three) times daily. 10/22/12   Sunnie Nielsen, MD  Lurasidone HCl (LATUDA) 20 MG TABS Take 10 mg by mouth daily.    Historical Provider, MD  medroxyPROGESTERone (DEPO-PROVERA) 150 MG/ML injection Inject 150 mg into the muscle every 3  (three) months.    Historical Provider, MD   BP 123/90 mmHg  Pulse 86  Temp(Src) 97.9 F (36.6 C) (Oral)  Resp 18  Ht  (1.702 m)  SpO2 99%  LMP 04/01/2014 Physical Exam  Constitutional: She is oriented to person, place, and time. She appears well-developed.  Morbidly obese female sitting in bed in no acute distress  HENT:  Head: Normocephalic.  Neck: Normal range of motion.  Cardiovascular: Normal rate.   Pulmonary/Chest: Effort normal.    2x3 cm fluctuant area under left breast with some erythema and mild tendernes  Abdominal: Soft.  Musculoskeletal: Normal range of motion.  Neurological: She is alert and oriented to person, place, and time.  Skin: Skin is warm and dry.     Psychiatric: She has a normal mood and affect.  Nursing note and vitals reviewed.   ED Course  INCISION AND DRAINAGE Date/Time: 04/29/2014 8:11 AM Performed by: Hilario Quarry Authorized by: Hilario Quarry Consent: Verbal consent obtained. Risks and benefits: risks, benefits and alternatives were discussed Consent given by: patient Patient identity confirmed: verbally with patient Time out: Immediately prior to procedure a "time out" was called to verify the correct patient, procedure, equipment, support staff and site/side marked as required. Type: abscess Location: left breast. Anesthesia: local infiltration Local anesthetic: lidocaine 1% with epinephrine Anesthetic total:  3 ml Patient sedated: no Scalpel size: 11 Needle gauge: 22 Incision type: single straight (probed to break up loculations) Complexity: complex Drainage: purulent Drainage amount: moderate Wound treatment: wound left open Patient tolerance: Patient tolerated the procedure well with no immediate complications   (including critical care time)   MDM   Final diagnoses:  Abscess of left breast  Encounter for incision and drainage procedure    Patient with left breast abscess with i and d done.  She also has  left inguinal indurated area without fluctuance.  Given history of mrsa and lesdon without ability to i and d now, will also treat with clindamycin.  Discussed need for follow up and return precautions.     Hilario Quarryanielle S Richa Shor, MD 04/29/14 [id number]  Hilario Quarryanielle S Nyana Haren, MD 04/29/14 [phone]

## 2014-04-29 NOTE — ED Notes (Signed)
Pt states abscess to L breast x 3 weeks and 1 to L inner thigh x 6 months.  No drainage noted.

## 2014-04-29 NOTE — Discharge Instructions (Signed)

## 2014-12-03 ENCOUNTER — Emergency Department (HOSPITAL_COMMUNITY): Payer: Medicaid Other

## 2014-12-03 ENCOUNTER — Emergency Department (HOSPITAL_COMMUNITY)
Admission: EM | Admit: 2014-12-03 | Discharge: 2014-12-03 | Disposition: A | Discharge status: Home / Self Care | Payer: Medicaid Other | Attending: Emergency Medicine | Admitting: Emergency Medicine

## 2014-12-03 ENCOUNTER — Encounter (HOSPITAL_COMMUNITY): Payer: Self-pay | Admitting: Emergency Medicine

## 2014-12-03 DIAGNOSIS — Z3202 Encounter for pregnancy test, result negative: Secondary | ICD-10-CM | POA: Diagnosis not present

## 2014-12-03 DIAGNOSIS — Z793 Long term (current) use of hormonal contraceptives: Secondary | ICD-10-CM | POA: Diagnosis not present

## 2014-12-03 DIAGNOSIS — R319 Hematuria, unspecified: Secondary | ICD-10-CM | POA: Insufficient documentation

## 2014-12-03 DIAGNOSIS — S3992XA Unspecified injury of lower back, initial encounter: Secondary | ICD-10-CM | POA: Diagnosis present

## 2014-12-03 DIAGNOSIS — Z792 Long term (current) use of antibiotics: Secondary | ICD-10-CM | POA: Insufficient documentation

## 2014-12-03 DIAGNOSIS — Y9389 Activity, other specified: Secondary | ICD-10-CM | POA: Insufficient documentation

## 2014-12-03 DIAGNOSIS — R102 Pelvic and perineal pain: Secondary | ICD-10-CM | POA: Diagnosis not present

## 2014-12-03 DIAGNOSIS — R109 Unspecified abdominal pain: Secondary | ICD-10-CM | POA: Diagnosis not present

## 2014-12-03 DIAGNOSIS — Y9289 Other specified places as the place of occurrence of the external cause: Secondary | ICD-10-CM | POA: Diagnosis not present

## 2014-12-03 DIAGNOSIS — S39012A Strain of muscle, fascia and tendon of lower back, initial encounter: Secondary | ICD-10-CM | POA: Diagnosis not present

## 2014-12-03 DIAGNOSIS — R300 Dysuria: Secondary | ICD-10-CM | POA: Diagnosis not present

## 2014-12-03 DIAGNOSIS — Z880 Allergy status to penicillin: Secondary | ICD-10-CM | POA: Diagnosis not present

## 2014-12-03 DIAGNOSIS — M549 Dorsalgia, unspecified: Secondary | ICD-10-CM

## 2014-12-03 DIAGNOSIS — Y998 Other external cause status: Secondary | ICD-10-CM | POA: Diagnosis not present

## 2014-12-03 DIAGNOSIS — X58XXXA Exposure to other specified factors, initial encounter: Secondary | ICD-10-CM | POA: Diagnosis not present

## 2014-12-03 DIAGNOSIS — Z720 Tobacco use: Secondary | ICD-10-CM | POA: Diagnosis not present

## 2014-12-03 DIAGNOSIS — Z79899 Other long term (current) drug therapy: Secondary | ICD-10-CM | POA: Insufficient documentation

## 2014-12-03 DIAGNOSIS — Z791 Long term (current) use of non-steroidal anti-inflammatories (NSAID): Secondary | ICD-10-CM | POA: Diagnosis not present

## 2014-12-03 DIAGNOSIS — R110 Nausea: Secondary | ICD-10-CM | POA: Insufficient documentation

## 2014-12-03 LAB — URINALYSIS, ROUTINE W REFLEX MICROSCOPIC
Glucose, UA: NEGATIVE mg/dL
Ketones, ur: NEGATIVE mg/dL
LEUKOCYTES UA: NEGATIVE
NITRITE: NEGATIVE
PH: 6 (ref 5.0–8.0)
Protein, ur: NEGATIVE mg/dL
SPECIFIC GRAVITY, URINE: 1.023 (ref 1.005–1.030)
Urobilinogen, UA: 0.2 mg/dL (ref 0.0–1.0)

## 2014-12-03 LAB — URINE MICROSCOPIC-ADD ON

## 2014-12-03 LAB — I-STAT BETA HCG BLOOD, ED (MC, WL, AP ONLY): I-stat hCG, quantitative: <5 m[IU]/mL (ref <5)

## 2014-12-03 MED ORDER — OXYCODONE-ACETAMINOPHEN 5-325 MG PO TABS: 1.0000 | ORAL_TABLET | ORAL | Status: DC | PRN

## 2014-12-03 NOTE — ED Notes (Signed)
Pt c/o bilateral lower back pain. Denies previous injury/surgery/trauma. Says, "my back started hurting and it felt like I needed to poop/piss all at the same time, like something needed to come out." It feels like a heaviness down there every time I need to use the bathroom. No other c/c. Took two Goody powders before coming via EMS.

## 2014-12-03 NOTE — ED Notes (Signed)
Per EMS, patient from home with c/o left lumbar pain and vaginal pressure. Patient unsure if she could be pregnant, states she feels like she needs to push. Patient reports last menstrual cycle two months ago and that she has a hx of being irregular. Patient arrived to triage tearful, was able to ambulate unassisted to wheelchair. Upon being placed in lobby patient was no longer crying.

## 2014-12-03 NOTE — ED Provider Notes (Signed)
CSN: [id number]     Arrival date & time 12/03/14  1953 History  By signing my name below, I, Amy Church, attest that this documentation has been prepared under the direction and in the presence of Antony Madura, PA-C  Electronically Signed: Gwenyth Church, ED Scribe. 12/03/2014. 9:04 PM.   Chief Complaint  Patient presents with  . Back Pain    left lower   The history is provided by the patient. No language interpreter was used.    HPI Comments: Amy Church is a 29 y.o. female BIBA who presents to the Emergency Department complaining of moderate vaginal pain that started earlier today. Pt states mild dysuria, nausea and hematuria as associated symptoms. She also notes generalized lower back pain that started 1 week ago, improved with Epsom soaks and rest, and became worse today. Pt reports suprapubic pressure and one episode of pain radiating from her vagina to her anus today while trying to urinate and pass stool. She states that she felt the urge to urinate and have BM, but that no urine or stool was passed during the episode. Pt took 3-4 Goody Powders PTA with some relief. 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 Pt denies a history of a kidney stones or back surgeries. She also denies bladder or bowel incontinence, vaginal discharge, fever, vomiting and numbness in her legs as associated symptoms.  Past Medical History  Diagnosis Date  . No pertinent past medical history    Past Surgical History  Procedure Laterality Date  . No past surgeries     History reviewed. No pertinent family history. Social History  Substance Use Topics  . Smoking status: Current Every Day Smoker -- 0.50 packs/day    Types: Cigarettes  . Smokeless tobacco: None  . Alcohol Use: No   OB History    Gravida Para Term Preterm AB TAB SAB Ectopic Multiple Living   Review of Systems  Gastrointestinal: Positive for nausea and abdominal pain.  Genitourinary: Positive for  dysuria, hematuria and vaginal pain.  Musculoskeletal: Positive for back pain.  All other systems reviewed and are negative.  Allergies  Penicillins  Home Medications   Prior to Admission medications   Medication Sig Start Date End Date Taking? Authorizing Provider  doxycycline (VIBRAMYCIN) 100 MG capsule Take 1 capsule (100 mg total) by mouth 2 (two) times daily. 04/29/14   Garlon Hatchet, PA-C  ibuprofen (ADVIL,MOTRIN) 800 MG tablet Take 1 tablet (800 mg total) by mouth 3 (three) times daily. 10/22/12   Sunnie Nielsen, MD  Lurasidone HCl (LATUDA) 20 MG TABS Take 10 mg by mouth daily.    Historical Provider, MD  medroxyPROGESTERone (DEPO-PROVERA) 150 MG/ML injection Inject 150 mg into the muscle every 3 (three) months.    Historical Provider, MD  oxyCODONE-acetaminophen (PERCOCET/ROXICET) 5-325 MG tablet Take 1 tablet by mouth every 4 (four) hours as needed for moderate pain or severe pain. 12/03/14   Antony Madura, PA-C   BP 138/88 mmHg  Pulse 88  Temp(Src) 98.1 F (36.7 C) (Oral)  Resp 16  SpO2 99%  LMP 11/19/2014 (Approximate)   Physical Exam  Constitutional: She is oriented to person, place, and time. She appears well-developed and well-nourished. No distress.  Patient laughing and joking with family.  HENT:  Head: Normocephalic and atraumatic.  Eyes: Conjunctivae and EOM are normal. No scleral icterus.  Neck: Normal range of motion.  Cardiovascular: Normal  rate, regular rhythm and intact distal pulses.   DP and PT pulses 2+ b/l  Pulmonary/Chest: Effort normal. No respiratory distress.  Respirations even and unlabored  Abdominal: Soft. She exhibits no distension.  Soft, obese, nondistended  Musculoskeletal: Normal range of motion. She exhibits tenderness.       Thoracic back: Normal.       Lumbar back: She exhibits tenderness and pain. She exhibits normal range of motion, no swelling, no deformity and no spasm.       Back:  Negative straight leg raise and crossed straight leg  raise  Neurological: She is alert and oriented to person, place, and time. She exhibits normal muscle tone. Coordination normal.  Sensation to light touch intact in all extremities. Patient ambulatory with steady gait.  Skin: Skin is warm and dry. No rash noted. She is not diaphoretic. No erythema. No pallor.  Psychiatric: She has a normal mood and affect. Her behavior is normal.  Nursing note and vitals reviewed.   ED Course  Procedures   DIAGNOSTIC STUDIES: Oxygen Saturation is 100% on RA, normal by my interpretation.    COORDINATION OF CARE: 9:03 PM Discussed treatment plan with pt which includes UA. Pt agreed to plan.  Labs Review Labs Reviewed  URINALYSIS, ROUTINE W REFLEX MICROSCOPIC (NOT AT [facility]) - Abnormal; Notable for the following:    APPearance CLOUDY (*)    Hgb urine dipstick LARGE (*)    Bilirubin Urine SMALL (*)    All other components within normal limits  URINE CULTURE  URINE MICROSCOPIC-ADD ON  I-STAT BETA HCG BLOOD, ED ([facility], [facility], [facility] ONLY)    Imaging Review Ct Renal Stone Study  12/03/2014   CLINICAL DATA:  Bilateral low back pain.  Hematuria.  EXAM: CT ABDOMEN AND PELVIS WITHOUT CONTRAST  TECHNIQUE: Multidetector CT imaging of the abdomen and pelvis was performed following the standard protocol without IV contrast.  COMPARISON:  None.  FINDINGS: There is no urinary calculus. There is no hydronephrosis or ureteral dilatation. No acute findings are evident in the abdomen or pelvis. There are unremarkable unenhanced appearances of the liver, spleen, pancreas, adrenals and kidneys. Bowel is unremarkable. Appendix is normal. Uterus and ovaries appear unremarkable. No significant musculoskeletal abnormalities are evident. No significant abnormality is evident in the lower chest.  IMPRESSION: No significant abnormality.   Electronically Signed   By: Ellery Plunk M.D.   On: 12/03/2014 23:06     I have personally reviewed and evaluated these lab results as part of my  medical decision-making.   EKG Interpretation None      2220 - Patient in no distress or evidence of discomfort. She is joking with family on reevaluation, eating a [employer] sandwich. MDM   Final diagnoses:  Back pain  Hematuria  Low back strain, initial encounter    29 year old female presents to the emergency department for further evaluation of low back pain. She also reports feeling a pressure in her suprapubic abdomen. Patient denies urinary symptoms. No vomiting or incontinence. No red flags or signs concerning for cauda equina. Patient is neurovascularly intact. Pain is reproducible on palpation to the lumbar paraspinal muscles. Pregnancy is negative and urinalysis with hematuria. No evidence of UTI. CT negative for kidney stones or other emergent process. Also doubt TOA or ovarian torsion especially given lack of fever, chronicity of symptoms, reproducibility of pain on exam, and mild nature of symptoms.   Symptoms likely secondary to MSK etiology. Will tx with Percocet. Ovarian cyst or mittelschmerz not  excluded, but these diagnoses are also appropriate for outpatient pain control. Have advised PCP f/u with return if symptoms worsen. Patient agreeable to plan with no unaddressed concerns. Patient discharged in good condition.  I, Jennalee Greaves, personally performed the services described in this documentation. All medical record entries made by the scribe were at my direction and in my presence.  I have reviewed the chart and discharge instructions and agree that the record reflects my personal performance and is accurate and complete. Amauri Medellin.  12/04/2014. 12:58 AM.      Antony Madura, PA-C 12/04/14 0102  Lorre Nick, MD 12/05/14 [phone]

## 2014-12-03 NOTE — Discharge Instructions (Signed)

## 2014-12-05 LAB — URINE CULTURE

## 2015-02-16 ENCOUNTER — Emergency Department (HOSPITAL_COMMUNITY): Payer: Medicaid Other

## 2015-02-16 ENCOUNTER — Encounter (HOSPITAL_COMMUNITY): Payer: Self-pay | Admitting: *Deleted

## 2015-02-16 ENCOUNTER — Emergency Department (HOSPITAL_COMMUNITY)
Admission: EM | Admit: 2015-02-16 | Discharge: 2015-02-16 | Disposition: A | Discharge status: Home / Self Care | Payer: Medicaid Other | Attending: Emergency Medicine | Admitting: Emergency Medicine

## 2015-02-16 DIAGNOSIS — Y998 Other external cause status: Secondary | ICD-10-CM | POA: Diagnosis not present

## 2015-02-16 DIAGNOSIS — S9002XA Contusion of left ankle, initial encounter: Secondary | ICD-10-CM | POA: Diagnosis not present

## 2015-02-16 DIAGNOSIS — Z79899 Other long term (current) drug therapy: Secondary | ICD-10-CM | POA: Insufficient documentation

## 2015-02-16 DIAGNOSIS — Z880 Allergy status to penicillin: Secondary | ICD-10-CM | POA: Insufficient documentation

## 2015-02-16 DIAGNOSIS — F17210 Nicotine dependence, cigarettes, uncomplicated: Secondary | ICD-10-CM | POA: Insufficient documentation

## 2015-02-16 DIAGNOSIS — S0990XA Unspecified injury of head, initial encounter: Secondary | ICD-10-CM

## 2015-02-16 DIAGNOSIS — Z7982 Long term (current) use of aspirin: Secondary | ICD-10-CM | POA: Insufficient documentation

## 2015-02-16 DIAGNOSIS — W01198A Fall on same level from slipping, tripping and stumbling with subsequent striking against other object, initial encounter: Secondary | ICD-10-CM | POA: Diagnosis not present

## 2015-02-16 DIAGNOSIS — Y92002 Bathroom of unspecified non-institutional (private) residence single-family (private) house as the place of occurrence of the external cause: Secondary | ICD-10-CM | POA: Diagnosis not present

## 2015-02-16 DIAGNOSIS — Y93E1 Activity, personal bathing and showering: Secondary | ICD-10-CM | POA: Diagnosis not present

## 2015-02-16 DIAGNOSIS — S93402A Sprain of unspecified ligament of left ankle, initial encounter: Secondary | ICD-10-CM

## 2015-02-16 DIAGNOSIS — W19XXXA Unspecified fall, initial encounter: Secondary | ICD-10-CM

## 2015-02-16 NOTE — Discharge Instructions (Signed)
Motrin 600 mg every 6 hours as needed for pain.  Return to the emergency department if symptoms significantly worsen or change.   Concussion, Adult A concussion, or closed-head injury, is a brain injury caused by a direct blow to the head or by a quick and sudden movement (jolt) of the head or neck. Concussions are usually not life-threatening. Even so, the effects of a concussion can be serious. If you have had a concussion before, you are more likely to experience concussion-like symptoms after a direct blow to the head.  CAUSES  Direct blow to the head, such as from running into another player during a soccer game, being hit in a fight, or hitting your head on a hard surface.  A jolt of the head or neck that causes the brain to move back and forth inside the skull, such as in a car crash. SIGNS AND SYMPTOMS The signs of a concussion can be hard to notice. Early on, they may be missed by you, family members, and health care providers. You may look fine but act or feel differently. Symptoms are usually temporary, but they may last for days, weeks, or even longer. Some symptoms may appear right away while others may not show up for hours or days. Every head injury is different. Symptoms include:  Mild to moderate headaches that will not go away.  A feeling of pressure inside your head.  Having more trouble than usual:  Learning or remembering things you have heard.  Answering questions.  Paying attention or concentrating.  Organizing daily tasks.  Making decisions and solving problems.  Slowness in thinking, acting or reacting, speaking, or reading.  Getting lost or being easily confused.  Feeling tired all the time or lacking energy (fatigued).  Feeling drowsy.  Sleep disturbances.  Sleeping more than usual.  Sleeping less than usual.  Trouble falling asleep.  Trouble sleeping (insomnia).  Loss of balance or feeling lightheaded or dizzy.  Nausea or  vomiting.  Numbness or tingling.  Increased sensitivity to:  Sounds.  Lights.  Distractions.  Vision problems or eyes that tire easily.  Diminished sense of taste or smell.  Ringing in the ears.  Mood changes such as feeling sad or anxious.  Becoming easily irritated or angry for little or no reason.  Lack of motivation.  Seeing or hearing things other people do not see or hear (hallucinations). DIAGNOSIS Your health care provider can usually diagnose a concussion based on a description of your injury and symptoms. He or she will ask whether you passed out (lost consciousness) and whether you are having trouble remembering events that happened right before and during your injury. Your evaluation might include:  A brain scan to look for signs of injury to the brain. Even if the test shows no injury, you may still have a concussion.  Blood tests to be sure other problems are not present. TREATMENT  Concussions are usually treated in an emergency department, in urgent care, or at a clinic. You may need to stay in the hospital overnight for further treatment.  Tell your health care provider if you are taking any medicines, including prescription medicines, over-the-counter medicines, and natural remedies. Some medicines, such as blood thinners (anticoagulants) and aspirin, may increase the chance of complications. Also tell your health care provider whether you have had alcohol or are taking illegal drugs. This information may affect treatment.  Your health care provider will send you home with important instructions to follow.  How fast you  will recover from a concussion depends on many factors. These factors include how severe your concussion is, what part of your brain was injured, your age, and how healthy you were before the concussion.  Most people with mild injuries recover fully. Recovery can take time. In general, recovery is slower in older persons. Also, persons who  have had a concussion in the past or have other medical problems may find that it takes longer to recover from their current injury. HOME CARE INSTRUCTIONS General Instructions  Carefully follow the directions your health care provider gave you.  Only take over-the-counter or prescription medicines for pain, discomfort, or fever as directed by your health care provider.  Take only those medicines that your health care provider has approved.  Do not drink alcohol until your health care provider says you are well enough to do so. Alcohol and certain other drugs may slow your recovery and can put you at risk of further injury.  If it is harder than usual to remember things, write them down.  If you are easily distracted, try to do one thing at a time. For example, do not try to watch TV while fixing dinner.  Talk with family members or close friends when making important decisions.  Keep all follow-up appointments. Repeated evaluation of your symptoms is recommended for your recovery.  Watch your symptoms and tell others to do the same. Complications sometimes occur after a concussion. Older adults with a brain injury may have a higher risk of serious complications, such as a blood clot on the brain.  Tell your teachers, school nurse, school counselor, coach, athletic trainer, or work Production designer, theatre/television/filmmanager about your injury, symptoms, and restrictions. Tell them about what you can or cannot do. They should watch for:  Increased problems with attention or concentration.  Increased difficulty remembering or learning new information.  Increased time needed to complete tasks or assignments.  Increased irritability or decreased ability to cope with stress.  Increased symptoms.  Rest. Rest helps the brain to heal. Make sure you:  Get plenty of sleep at night. Avoid staying up late at night.  Keep the same bedtime hours on weekends and weekdays.  Rest during the day. Take daytime naps or rest breaks  when you feel tired.  Limit activities that require a lot of thought or concentration. These include:  Doing homework or job-related work.  Watching TV.  Working on the computer.  Avoid any situation where there is potential for another head injury (football, hockey, soccer, basketball, martial arts, downhill snow sports and horseback riding). Your condition will get worse every time you experience a concussion. You should avoid these activities until you are evaluated by the appropriate follow-up health care providers. Returning To Your Regular Activities You will need to return to your normal activities slowly, not all at once. You must give your body and brain enough time for recovery.  Do not return to sports or other athletic activities until your health care provider tells you it is safe to do so.  Ask your health care provider when you can drive, ride a bicycle, or operate heavy machinery. Your ability to react may be slower after a brain injury. Never do these activities if you are dizzy.  Ask your health care provider about when you can return to work or school. Preventing Another Concussion It is very important to avoid another brain injury, especially before you have recovered. In rare cases, another injury can lead to permanent brain damage, brain  swelling, or death. The risk of this is greatest during the first 7-10 days after a head injury. Avoid injuries by:  Wearing a seat belt when riding in a car.  Drinking alcohol only in moderation.  Wearing a helmet when biking, skiing, skateboarding, skating, or doing similar activities.  Avoiding activities that could lead to a second concussion, such as contact or recreational sports, until your health care provider says it is okay.  Taking safety measures in your home.  Remove clutter and tripping hazards from floors and stairways.  Use grab bars in bathrooms and handrails by stairs.  Place non-slip mats on floors and in  bathtubs.  Improve lighting in dim areas. SEEK MEDICAL CARE IF:  You have increased problems paying attention or concentrating.  You have increased difficulty remembering or learning new information.  You need more time to complete tasks or assignments than before.  You have increased irritability or decreased ability to cope with stress.  You have more symptoms than before. Seek medical care if you have any of the following symptoms for more than 2 weeks after your injury:  Lasting (chronic) headaches.  Dizziness or balance problems.  Nausea.  Vision problems.  Increased sensitivity to noise or light.  Depression or mood swings.  Anxiety or irritability.  Memory problems.  Difficulty concentrating or paying attention.  Sleep problems.  Feeling tired all the time. SEEK IMMEDIATE MEDICAL CARE IF:  You have severe or worsening headaches. These may be a sign of a blood clot in the brain.  You have weakness (even if only in one hand, leg, or part of the face).  You have numbness.  You have decreased coordination.  You vomit repeatedly.  You have increased sleepiness.  One pupil is larger than the other.  You have convulsions.  You have slurred speech.  You have increased confusion. This may be a sign of a blood clot in the brain.  You have increased restlessness, agitation, or irritability.  You are unable to recognize people or places.  You have neck pain.  It is difficult to wake you up.  You have unusual behavior changes.  You lose consciousness. MAKE SURE YOU:  Understand these instructions.  Will watch your condition.  Will get help right away if you are not doing well or get worse.   This information is not intended to replace advice given to you by your health care provider. Make sure you discuss any questions you have with your health care provider.   Document Released: 05/08/2003 Document Revised: 03/08/2014 Document Reviewed:  09/07/2012 Elsevier Interactive Patient Education 2016 [employer]  Ankle Sprain An ankle sprain is an injury to the strong, fibrous tissues (ligaments) that hold the bones of your ankle joint together.  CAUSES An ankle sprain is usually caused by a fall or by twisting your ankle. Ankle sprains most commonly occur when you step on the outer edge of your foot, and your ankle turns inward. People who participate in sports are more prone to these types of injuries.  SYMPTOMS   Pain in your ankle. The pain may be present at rest or only when you are trying to stand or walk.  Swelling.  Bruising. Bruising may develop immediately or within 1 to 2 days after your injury.  Difficulty standing or walking, particularly when turning corners or changing directions. DIAGNOSIS  Your caregiver will ask you details about your injury and perform a physical exam of your ankle to determine if you have an  ankle sprain. During the physical exam, your caregiver will press on and apply pressure to specific areas of your foot and ankle. Your caregiver will try to move your ankle in certain ways. An X-ray exam may be done to be sure a bone was not broken or a ligament did not separate from one of the bones in your ankle (avulsion fracture).  TREATMENT  Certain types of braces can help stabilize your ankle. Your caregiver can make a recommendation for this. Your caregiver may recommend the use of medicine for pain. If your sprain is severe, your caregiver may refer you to a surgeon who helps to restore function to parts of your skeletal system (orthopedist) or a physical therapist. HOME CARE INSTRUCTIONS   Apply ice to your injury for 1-2 days or as directed by your caregiver. Applying ice helps to reduce inflammation and pain.  Put ice in a plastic bag.  Place a towel between your skin and the bag.  Leave the ice on for 15-20 minutes at a time, every 2 hours while you are awake.  Only take over-the-counter  or prescription medicines for pain, discomfort, or fever as directed by your caregiver.  Elevate your injured ankle above the level of your heart as much as possible for 2-3 days.  If your caregiver recommends crutches, use them as instructed. Gradually put weight on the affected ankle. Continue to use crutches or a cane until you can walk without feeling pain in your ankle.  If you have a plaster splint, wear the splint as directed by your caregiver. Do not rest it on anything harder than a pillow for the first 24 hours. Do not put weight on it. Do not get it wet. You may take it off to take a shower or bath.  You may have been given an elastic bandage to wear around your ankle to provide support. If the elastic bandage is too tight (you have numbness or tingling in your foot or your foot becomes cold and blue), adjust the bandage to make it comfortable.  If you have an air splint, you may blow more air into it or let air out to make it more comfortable. You may take your splint off at night and before taking a shower or bath. Wiggle your toes in the splint several times per day to decrease swelling. SEEK MEDICAL CARE IF:   You have rapidly increasing bruising or swelling.  Your toes feel extremely cold or you lose feeling in your foot.  Your pain is not relieved with medicine. SEEK IMMEDIATE MEDICAL CARE IF:  Your toes are numb or blue.  You have severe pain that is increasing. MAKE SURE YOU:   Understand these instructions.  Will watch your condition.  Will get help right away if you are not doing well or get worse.   This information is not intended to replace advice given to you by your health care provider. Make sure you discuss any questions you have with your health care provider.   Document Released: 02/15/2005 Document Revised: 03/08/2014 Document Reviewed: 02/27/2011 Elsevier Interactive Patient Education [employer]

## 2015-02-16 NOTE — ED Notes (Signed)
CT called to see why the pt CT was cancelled. CT sts that they did not know but they will put the order back in. Dr Judd Lienelo made aware

## 2015-02-16 NOTE — ED Notes (Signed)
Pt reports this morning she slipped in the shower hitting her head on the metal part of toilet, unsure of LOC, has a swelling to left side of head

## 2015-02-16 NOTE — ED Notes (Signed)
Pt is eating and drinking during triage

## 2015-02-16 NOTE — ED Provider Notes (Signed)
CSN: [id number]     Arrival date & time 02/16/15  1118 History   First MD Initiated Contact with Patient 02/16/15 1133     Chief Complaint  Patient presents with  . Fall     (Consider location/radiation/quality/duration/timing/severity/associated sxs/prior Treatment) HPI Comments: Patient is a 29 year old female with no significant past medical history. She presents after a fall. She reports slipping on a wet floor when getting out of the shower and striking her head on the commode. She denies loss of consciousness but states that she required approximately 10 minutes to compose herself before she stand up. She reports "seeing stars". She has had a headache since the fall. She also injured her left ankle.  Patient is a 29 y.o. female presenting with fall. The history is provided by the patient.  Fall This is a new problem. The current episode started 1 to 2 hours ago. The problem occurs constantly. Associated symptoms include headaches. Nothing aggravates the symptoms. Nothing relieves the symptoms. She has tried nothing for the symptoms.    Past Medical History  Diagnosis Date  . No pertinent past medical history    Past Surgical History  Procedure Laterality Date  . No past surgeries     No family history on file. Social History  Substance Use Topics  . Smoking status: Current Every Day Smoker -- 0.50 packs/day    Types: Cigarettes  . Smokeless tobacco: None  . Alcohol Use: No   OB History    Gravida Para Term Preterm AB TAB SAB Ectopic Multiple Living   Review of Systems  Neurological: Positive for headaches.  All other systems reviewed and are negative.     Allergies  Penicillins  Home Medications   Prior to Admission medications   Medication Sig Start Date End Date Taking? Authorizing Provider  aspirin 325 MG tablet Take 1,300 mg by mouth once.   Yes Historical Provider, MD  Multiple Vitamin (MULTIVITAMIN WITH MINERALS) TABS tablet Take 2  tablets by mouth daily.   Yes Historical Provider, MD  Multiple Vitamins-Minerals (HAIR SKIN AND NAILS FORMULA PO) Take 2 tablets by mouth daily.   Yes Historical Provider, MD  doxycycline (VIBRAMYCIN) 100 MG capsule Take 1 capsule (100 mg total) by mouth 2 (two) times daily. Patient not taking: Reported on 02/16/2015 04/29/14   Garlon Hatchet, PA-C  Lurasidone HCl (LATUDA) 20 MG TABS Take 10 mg by mouth daily.    Historical Provider, MD  oxyCODONE-acetaminophen (PERCOCET/ROXICET) 5-325 MG tablet Take 1 tablet by mouth every 4 (four) hours as needed for moderate pain or severe pain. Patient not taking: Reported on 02/16/2015 12/03/14   Antony Madura, PA-C   BP 145/109 mmHg  Pulse 89  Temp(Src) 98 F (36.7 C) (Oral)  Resp 16  SpO2 99% Physical Exam  Constitutional: She is oriented to person, place, and time. She appears well-developed and well-nourished. No distress.  HENT:  Head: Normocephalic.  There is swelling and tenderness to the left parietal region. There are no lacerations.  Eyes: EOM are normal. Pupils are equal, round, and reactive to light.  Neck: Normal range of motion. Neck supple.  Musculoskeletal: Normal range of motion. She exhibits no edema.  There is a small ecchymosis to the left ankle, but the ankle otherwise appears normal.  There is good range of motion.  Lymphadenopathy:    She has no cervical adenopathy.  Neurological: She is alert and oriented  to person, place, and time. No cranial nerve deficit. She exhibits normal muscle tone. Coordination normal.  Skin: Skin is warm and dry. She is not diaphoretic.  Nursing note and vitals reviewed.   ED Course  Procedures (including critical care time) Labs Review Labs Reviewed - No data to display  Imaging Review No results found. I have personally reviewed and evaluated these images and lab results as part of my medical decision-making.   EKG Interpretation None      MDM   Final diagnoses:  None    CT scan  shows a scalp contusion, however no skull fracture or intracranial bleeding. Ankle x-rays negative. She will be discharged home with instructions to return as needed for any problems.    Geoffery Lyonsouglas Kayn Haymore, MD 02/16/15 [phone]

## 2015-04-23 ENCOUNTER — Emergency Department (HOSPITAL_COMMUNITY)
Admission: EM | Admit: 2015-04-23 | Discharge: 2015-04-23 | Discharge status: Absent without leave | Payer: Medicaid Other

## 2016-07-21 ENCOUNTER — Encounter (HOSPITAL_COMMUNITY): Payer: Self-pay | Admitting: Emergency Medicine

## 2016-07-21 ENCOUNTER — Ambulatory Visit (HOSPITAL_COMMUNITY)
Admission: EM | Admit: 2016-07-21 | Discharge: 2016-07-21 | Disposition: A | Discharge status: Home / Self Care | Payer: Medicaid Other | Attending: Family Medicine | Admitting: Family Medicine

## 2016-07-21 DIAGNOSIS — M545 Low back pain: Secondary | ICD-10-CM

## 2016-07-21 DIAGNOSIS — R110 Nausea: Secondary | ICD-10-CM

## 2016-07-21 DIAGNOSIS — N200 Calculus of kidney: Secondary | ICD-10-CM | POA: Insufficient documentation

## 2016-07-21 LAB — POCT URINALYSIS DIP (DEVICE)
GLUCOSE, UA: NEGATIVE mg/dL
Nitrite: NEGATIVE
PH: 7.5 (ref 5.0–8.0)
Protein, ur: 100 mg/dL — AB
Specific Gravity, Urine: 1.02 (ref 1.005–1.030)
Urobilinogen, UA: 1 mg/dL (ref 0.0–1.0)

## 2016-07-21 MED ORDER — TAMSULOSIN HCL 0.4 MG PO CAPS: 0.4000 mg | ORAL_CAPSULE | Freq: Every morning | ORAL | 0 refills | Status: DC

## 2016-07-21 MED ORDER — HYDROCODONE-ACETAMINOPHEN 5-325 MG PO TABS: 1.0000 | ORAL_TABLET | ORAL | 0 refills | Status: DC | PRN

## 2016-07-21 MED ORDER — HYDROCODONE-ACETAMINOPHEN 5-325 MG PO TABS
1.0000 | ORAL_TABLET | Freq: Once | ORAL | Status: AC
Start: 2016-07-21 — End: 2016-07-21
  Administered 2016-07-21: 1 via ORAL

## 2016-07-21 MED ORDER — KETOROLAC TROMETHAMINE 60 MG/2ML IM SOLN
INTRAMUSCULAR | Status: AC
  Filled 2016-07-21: qty 2

## 2016-07-21 MED ORDER — HYDROCODONE-ACETAMINOPHEN 5-325 MG PO TABS
ORAL_TABLET | ORAL | Status: AC
  Filled 2016-07-21: qty 1

## 2016-07-21 MED ORDER — ONDANSETRON 4 MG PO TBDP
ORAL_TABLET | ORAL | Status: AC
  Filled 2016-07-21: qty 1

## 2016-07-21 MED ORDER — KETOROLAC TROMETHAMINE 60 MG/2ML IM SOLN
60.0000 mg | Freq: Once | INTRAMUSCULAR | Status: AC
  Administered 2016-07-21: 60 mg via INTRAMUSCULAR

## 2016-07-21 MED ORDER — ONDANSETRON 4 MG PO TBDP
4.0000 mg | ORAL_TABLET | Freq: Once | ORAL | Status: AC
  Administered 2016-07-21: 4 mg via ORAL

## 2016-07-21 NOTE — ED Provider Notes (Signed)
CSN: [id number]     Arrival date & time 07/21/16  1220 History   None    Chief Complaint  Patient presents with  . Back Pain   (Consider location/radiation/quality/duration/timing/severity/associated sxs/prior Treatment) Patient c/o right sided back pain that radiates to her vagina.  She is having pain 10 on a scale of 1-10 and it is persistent. She is having nausea.   The history is provided by the patient.  Back Pain  Pain location: Right Flank. Radiates to: vagina. Pain severity:  Severe Pain is:  Worse during the day Onset quality:  Sudden Duration:  1 day Timing:  Constant Chronicity:  New Relieved by:  Nothing Worsened by:  Nothing   Past Medical History:  Diagnosis Date  . No pertinent past medical history    Past Surgical History:  Procedure Laterality Date  . NO PAST SURGERIES     History reviewed. No pertinent family history. Social History  Substance Use Topics  . Smoking status: Current Every Day Smoker    Packs/day: 0.50    Types: Cigarettes  . Smokeless tobacco: Never Used  . Alcohol use No   OB History    Gravida Para Term Preterm AB Living   3 1 1   1 1    SAB TAB Ectopic Multiple Live Births     1           Review of Systems  Constitutional: Negative.   HENT: Negative.   Eyes: Negative.   Respiratory: Negative.   Cardiovascular: Negative.   Gastrointestinal: Negative.   Endocrine: Negative.   Genitourinary: Negative.   Musculoskeletal: Positive for back pain.  Allergic/Immunologic: Negative.   Neurological: Negative.   Hematological: Negative.   Psychiatric/Behavioral: Negative.     Allergies  Penicillins  Home Medications   Prior to Admission medications   Medication Sig Start Date End Date Taking? Authorizing Provider  aspirin 325 MG tablet Take 1,300 mg by mouth once.    [provider]  doxycycline (VIBRAMYCIN) 100 MG capsule Take 1 capsule (100 mg total) by mouth 2 (two) times daily. Patient not taking: Reported  on 02/16/2015 04/29/14   Garlon Hatchet, PA-C  HYDROcodone-acetaminophen (NORCO/VICODIN) 5-325 MG tablet Take 1-2 tablets by mouth every 4 (four) hours as needed. 07/21/16   Deatra Canter, FNP  Lurasidone HCl (LATUDA) 20 MG TABS Take 10 mg by mouth daily.    [provider]  Multiple Vitamin (MULTIVITAMIN WITH MINERALS) TABS tablet Take 2 tablets by mouth daily.    [provider]  Multiple Vitamins-Minerals (HAIR SKIN AND NAILS FORMULA PO) Take 2 tablets by mouth daily.    [provider]  oxyCODONE-acetaminophen (PERCOCET/ROXICET) 5-325 MG tablet Take 1 tablet by mouth every 4 (four) hours as needed for moderate pain or severe pain. Patient not taking: Reported on 02/16/2015 12/03/14   Antony Madura, PA-C  tamsulosin [facility]) 0.4 MG CAPS capsule Take 1 capsule (0.4 mg total) by mouth every morning. 07/21/16   Deatra Canter, FNP   Meds Ordered and Administered this Visit   Medications  ketorolac (TORADOL) injection 60 mg (60 mg Intramuscular Given 07/21/16 1309)  HYDROcodone-acetaminophen (NORCO/VICODIN) 5-325 MG per tablet 1 tablet (1 tablet Oral Given 07/21/16 1307)  ondansetron (ZOFRAN-ODT) disintegrating tablet 4 mg (4 mg Oral Given 07/21/16 1307)    BP (!) 157/109 (BP Location: Left Wrist)   Pulse 88   Temp 98.1 F (36.7 C) (Oral)   SpO2 97%  No data found.   Physical Exam  Constitutional: She appears well-developed and well-nourished.  HENT:  Head: Normocephalic and atraumatic.  Eyes: Conjunctivae and EOM are normal. Pupils are equal, round, and reactive to light.  Neck: Normal range of motion. Neck supple.  Cardiovascular: Normal rate, regular rhythm and normal heart sounds.   Pulmonary/Chest: Effort normal and breath sounds normal.  Abdominal: Soft. Bowel sounds are normal.  Genitourinary:  Genitourinary Comments: Right CVA tenderness.  Nursing note and vitals reviewed.   Urgent Care Course     Procedures (including critical care  time)  Labs Review Labs Reviewed  POCT URINALYSIS DIP (DEVICE) - Abnormal; Notable for the following:       Result Value   Bilirubin Urine SMALL (*)    Ketones, ur TRACE (*)    Hgb urine dipstick LARGE (*)    Protein, ur 100 (*)    Leukocytes, UA TRACE (*)    All other components within normal limits  URINE CULTURE    Imaging Review No results found.   Visual Acuity Review  Right Eye Distance:   Left Eye Distance:   Bilateral Distance:    Right Eye Near:   Left Eye Near:    Bilateral Near:         MDM   1. Kidney stone   Zofran ODT 4mg  po now  Toradol 60mg  IM now Norco 5mg /325mg  one po now  Norco 5/325 one to two po q 6 hours prn #20 Flomax 0.4mg  one po qd #14  Push po fluids  If unable to pass stone in next 3 days follow up at the ED. If unable to void or if pain is severe and pain medicine is not working then go to ED.      Deatra CanterOxford, Kimberlly Norgard J, FNP 07/21/16 1336

## 2016-07-21 NOTE — Discharge Instructions (Signed)
If you have not passed stone in next 3 days then follow up at ED.  If unable to urinate and/ or if pain is severe and pain medicine does not help then go to Emergency room.

## 2016-07-21 NOTE — ED Triage Notes (Signed)
Pt reports right flank pain that radiates dwon to her left lower abdomen and into her vagina.  She describes the pain as intermittent and sharp.  She has decreased urine output since yesterday and hematuria.  Pt reports irregular menstrual cycle and began bleeding yesterday.  She reports filling a pad every 30 minutes.  Pt is in tears and reports extreme pain at times.

## 2016-07-22 LAB — URINE CULTURE: Culture: NO GROWTH

## 2016-11-08 ENCOUNTER — Ambulatory Visit (INDEPENDENT_AMBULATORY_CARE_PROVIDER_SITE_OTHER): Payer: Medicaid Other | Admitting: Family Medicine

## 2016-11-08 ENCOUNTER — Encounter: Payer: Self-pay | Admitting: Family Medicine

## 2016-11-08 ENCOUNTER — Other Ambulatory Visit (HOSPITAL_COMMUNITY)
Admission: RE | Admit: 2016-11-08 | Discharge: 2016-11-08 | Disposition: A | Discharge status: Home / Self Care | Payer: Medicaid Other | Source: Ambulatory Visit | Attending: Family Medicine | Admitting: Family Medicine

## 2016-11-08 VITALS — BP 137/105 | Ht 68.0 in | Wt 305.0 lb

## 2016-11-08 DIAGNOSIS — R102 Pelvic and perineal pain: Secondary | ICD-10-CM | POA: Insufficient documentation

## 2016-11-08 DIAGNOSIS — Z3049 Encounter for surveillance of other contraceptives: Secondary | ICD-10-CM | POA: Diagnosis not present

## 2016-11-08 DIAGNOSIS — Z124 Encounter for screening for malignant neoplasm of cervix: Secondary | ICD-10-CM

## 2016-11-08 DIAGNOSIS — Z01419 Encounter for gynecological examination (general) (routine) without abnormal findings: Secondary | ICD-10-CM

## 2016-11-08 MED ORDER — DOXYCYCLINE HYCLATE 100 MG PO CAPS: 100.0000 mg | ORAL_CAPSULE | Freq: Two times a day (BID) | ORAL | 0 refills | Status: DC

## 2016-11-08 NOTE — Patient Instructions (Signed)
Preventive Care 18-39 Years, Female Preventive care refers to lifestyle choices and visits with your health care provider that can promote health and wellness. What does preventive care include?  A yearly physical exam. This is also called an annual well check.  Dental exams once or twice a year.  Routine eye exams. Ask your health care provider how often you should have your eyes checked.  Personal lifestyle choices, including: ? Daily care of your teeth and gums. ? Regular physical activity. ? Eating a healthy diet. ? Avoiding tobacco and drug use. ? Limiting alcohol use. ? Practicing safe sex. ? Taking vitamin and mineral supplements as recommended by your health care provider. What happens during an annual well check? The services and screenings done by your health care provider during your annual well check will depend on your age, overall health, lifestyle risk factors, and family history of disease. Counseling Your health care provider may ask you questions about your:  Alcohol use.  Tobacco use.  Drug use.  Emotional well-being.  Home and relationship well-being.  Sexual activity.  Eating habits.  Work and work Statistician.  Method of birth control.  Menstrual cycle.  Pregnancy history.  Screening You may have the following tests or measurements:  Height, weight, and BMI.  Diabetes screening. This is done by checking your blood sugar (glucose) after you have not eaten for a while (fasting).  Blood pressure.  Lipid and cholesterol levels. These may be checked every 5 years starting at age 38.  Skin check.  Hepatitis C blood test.  Hepatitis B blood test.  Sexually transmitted disease (STD) testing.  BRCA-related cancer screening. This may be done if you have a family history of breast, ovarian, tubal, or peritoneal cancers.  Pelvic exam and Pap test. This may be done every 3 years starting at age 38. Starting at age 30, this may be done  every 5 years if you have a Pap test in combination with an HPV test.  Discuss your test results, treatment options, and if necessary, the need for more tests with your health care provider. Vaccines Your health care provider may recommend certain vaccines, such as:  Influenza vaccine. This is recommended every year.  Tetanus, diphtheria, and acellular pertussis (Tdap, Td) vaccine. You may need a Td booster every 10 years.  Varicella vaccine. You may need this if you have not been vaccinated.  HPV vaccine. If you are 39 or younger, you may need three doses over 6 months.  Measles, mumps, and rubella (MMR) vaccine. You may need at least one dose of MMR. You may also need a second dose.  Pneumococcal 13-valent conjugate (PCV13) vaccine. You may need this if you have certain conditions and were not previously vaccinated.  Pneumococcal polysaccharide (PPSV23) vaccine. You may need one or two doses if you smoke cigarettes or if you have certain conditions.  Meningococcal vaccine. One dose is recommended if you are age 68-21 years and a first-year college student living in a residence hall, or if you have one of several medical conditions. You may also need additional booster doses.  Hepatitis A vaccine. You may need this if you have certain conditions or if you travel or work in places where you may be exposed to hepatitis A.  Hepatitis B vaccine. You may need this if you have certain conditions or if you travel or work in places where you may be exposed to hepatitis B.  Haemophilus influenzae type b (Hib) vaccine. You may need this  if you have certain risk factors.  Talk to your health care provider about which screenings and vaccines you need and how often you need them. This information is not intended to replace advice given to you by your health care provider. Make sure you discuss any questions you have with your health care provider. Document Released: 04/13/2001 Document Revised:  11/05/2015 Document Reviewed: 12/17/2014 Elsevier Interactive Patient Education  2017 Elsevier Inc.  

## 2016-11-08 NOTE — Progress Notes (Signed)
Pt reports that she is on bp medication but did not take it today.

## 2016-11-08 NOTE — Assessment & Plan Note (Signed)
With associated abdominal pain. Unclear etiology, non-tender pelvic and non-cyclical pain. Advised to keep symptom diary along with activity, food, etc. Weeklong trials of no dairy and gluten free to see if this impacts. Presumptive chronic endometritis treatment and check pelvic sono.

## 2016-11-08 NOTE — Progress Notes (Signed)
Subjective:     Amy Church is a 31 y.o. female and is here for a comprehensive physical exam. The patient reports problems - abdominal pain.Reports pains in her vagina that starts in her stomach. Feels like it is balling up in a fist. Shoots down to her vagina and rectum. Pain is random. Pain is not related to her cycle. Cycles are irregular. Sometimes every 1-3 months. Pain is daily and vaginal pain is weekly. Worse when it shoots to her vagina. Feels like labor. It will last for a few minutes, then she sits up off it and it improves. Pain has been going on for years and is worse every year. She knows something is wrong but she does not know what it is. She has seen several MDs in the past and none have been able to tell her what is wrong.  Social History   Social History  . Marital status: Single    Spouse name: N/A  . Number of children: N/A  . Years of education: N/A   Occupational History  . Not on file.   Social History Main Topics  . Smoking status: Current Every Day Smoker    Packs/day: 0.50    Types: Cigarettes  . Smokeless tobacco: Never Used  . Alcohol use No  . Drug use: Yes    Types: Marijuana     Comment: Last marijuana 2 weeks ago  . Sexual activity: Yes   Other Topics Concern  . Not on file   Social History Narrative  . No narrative on file   Health Maintenance  Topic Date Due  . HIV Screening  08/28/1999  . PAP SMEAR  08/27/2005  . INFLUENZA VACCINE  09/29/2016  . TETANUS/TDAP  06/14/2022    The following portions of the patient's history were reviewed and updated as appropriate: allergies, current medications, past family history, past medical history, past social history, past surgical history and problem list.  Review of Systems Pertinent items noted in HPI and remainder of comprehensive ROS otherwise negative.   Objective:    BP (!) 137/105   Ht 5\' 8"  (1.727 m)   Wt (!) 305 lb (138.3 kg)   LMP 10/30/2016   BMI 46.38 kg/m  General  appearance: alert, cooperative, appears stated age, no distress and morbidly obese Head: Normocephalic, without obvious abnormality, atraumatic Neck: no adenopathy, supple, symmetrical, trachea midline and thyroid not enlarged, symmetric, no tenderness/mass/nodules Lungs: clear to auscultation bilaterally Breasts: normal appearance, no masses or tenderness Heart: regular rate and rhythm, S1, S2 normal, no murmur, click, rub or gallop Abdomen: soft, non-tender; bowel sounds normal; no masses,  no organomegaly Pelvic: cervix normal in appearance, external genitalia normal, no adnexal masses or tenderness, no cervical motion tenderness, uterus normal size, shape, and consistency, vagina normal without discharge and exam liimited by body habitus Extremities: Homans sign is negative, no sign of DVT Pulses: 2+ and symmetric Skin: Skin color, texture, turgor normal. No rashes or lesions Lymph nodes: Cervical, supraclavicular, and axillary nodes normal. Neurologic: Grossly normal    Assessment:    GYN female exam.      Plan:   Problem List Items Addressed This Visit      Unprioritized   Pelvic pain - Primary    With associated abdominal pain. Unclear etiology, non-tender pelvic and non-cyclical pain. Advised to keep symptom diary along with activity, food, etc. Weeklong trials of no dairy and gluten free to see if this impacts. Presumptive chronic endometritis treatment and check  pelvic sono.      Relevant Medications   doxycycline (VIBRAMYCIN) 100 MG capsule   Other Relevant Orders   US PELVIS TRANSVANGINAL NON-OB (TV ONLY)    Other Visit Diagnoses    Screening for malignant neoplasm of cervix       Relevant Orders   Cytology - PAP   Encounter for gynecological examination without abnormal finding         Return in 4 weeks (on 12/06/2016) for a follow-up.    See After Visit Summary for Counseling Recommendations

## 2016-11-09 LAB — CYTOLOGY - PAP
Chlamydia: NEGATIVE
Diagnosis: NEGATIVE
HPV (WINDOPATH): NOT DETECTED
Neisseria Gonorrhea: NEGATIVE

## 2016-11-17 ENCOUNTER — Ambulatory Visit (HOSPITAL_COMMUNITY): Payer: Medicaid Other

## 2016-11-23 ENCOUNTER — Ambulatory Visit (HOSPITAL_COMMUNITY)
Admission: RE | Admit: 2016-11-23 | Discharge: 2016-11-23 | Disposition: A | Discharge status: Home / Self Care | Payer: Medicaid Other | Source: Ambulatory Visit | Attending: Family Medicine | Admitting: Family Medicine

## 2016-11-23 DIAGNOSIS — N926 Irregular menstruation, unspecified: Secondary | ICD-10-CM | POA: Insufficient documentation

## 2016-11-23 DIAGNOSIS — G8929 Other chronic pain: Secondary | ICD-10-CM | POA: Insufficient documentation

## 2016-11-23 DIAGNOSIS — R102 Pelvic and perineal pain: Secondary | ICD-10-CM | POA: Insufficient documentation

## 2016-12-01 ENCOUNTER — Encounter: Payer: Self-pay | Admitting: Family Medicine

## 2016-12-01 ENCOUNTER — Ambulatory Visit: Payer: Medicaid Other | Admitting: Family Medicine

## 2016-12-01 ENCOUNTER — Telehealth: Payer: Self-pay | Admitting: *Deleted

## 2016-12-01 NOTE — Progress Notes (Signed)
Patient did not keep appointment today. She will be called to reschedule.  

## 2016-12-01 NOTE — Telephone Encounter (Signed)
Amy Church missed her scheduled appointment for follow up of pelvic pain. I called her and notified her. She states she wants to wait on rescheduling because she is working on getting some insurance. Informed her she can call back when ready to reschedule or for problems . She voices understanding.

## 2017-06-19 ENCOUNTER — Emergency Department (HOSPITAL_BASED_OUTPATIENT_CLINIC_OR_DEPARTMENT_OTHER)
Admission: EM | Admit: 2017-06-19 | Discharge: 2017-06-20 | Disposition: A | Discharge status: Home / Self Care | Payer: Medicaid Other | Attending: Emergency Medicine | Admitting: Emergency Medicine

## 2017-06-19 ENCOUNTER — Other Ambulatory Visit: Payer: Self-pay

## 2017-06-19 ENCOUNTER — Emergency Department (HOSPITAL_BASED_OUTPATIENT_CLINIC_OR_DEPARTMENT_OTHER): Payer: Medicaid Other

## 2017-06-19 ENCOUNTER — Encounter (HOSPITAL_BASED_OUTPATIENT_CLINIC_OR_DEPARTMENT_OTHER): Payer: Self-pay | Admitting: *Deleted

## 2017-06-19 DIAGNOSIS — R300 Dysuria: Secondary | ICD-10-CM | POA: Insufficient documentation

## 2017-06-19 DIAGNOSIS — I10 Essential (primary) hypertension: Secondary | ICD-10-CM | POA: Insufficient documentation

## 2017-06-19 DIAGNOSIS — R309 Painful micturition, unspecified: Secondary | ICD-10-CM | POA: Insufficient documentation

## 2017-06-19 DIAGNOSIS — R310 Gross hematuria: Secondary | ICD-10-CM

## 2017-06-19 DIAGNOSIS — F17210 Nicotine dependence, cigarettes, uncomplicated: Secondary | ICD-10-CM | POA: Diagnosis not present

## 2017-06-19 DIAGNOSIS — Z79899 Other long term (current) drug therapy: Secondary | ICD-10-CM | POA: Insufficient documentation

## 2017-06-19 DIAGNOSIS — F1210 Cannabis abuse, uncomplicated: Secondary | ICD-10-CM | POA: Diagnosis not present

## 2017-06-19 HISTORY — DX: Essential (primary) hypertension: I10

## 2017-06-19 LAB — URINALYSIS, ROUTINE W REFLEX MICROSCOPIC
Glucose, UA: NEGATIVE mg/dL
KETONES UR: 15 mg/dL — AB
LEUKOCYTES UA: NEGATIVE
NITRITE: NEGATIVE
PROTEIN: NEGATIVE mg/dL
Specific Gravity, Urine: 1.025 (ref 1.005–1.030)
pH: 6 (ref 5.0–8.0)

## 2017-06-19 LAB — WET PREP, GENITAL
CLUE CELLS WET PREP: NONE SEEN
SPERM: NONE SEEN
TRICH WET PREP: NONE SEEN
Yeast Wet Prep HPF POC: NONE SEEN

## 2017-06-19 LAB — URINALYSIS, MICROSCOPIC (REFLEX)

## 2017-06-19 LAB — PREGNANCY, URINE: PREG TEST UR: NEGATIVE

## 2017-06-19 MED ORDER — HYDROCODONE-ACETAMINOPHEN 5-325 MG PO TABS
1.0000 | ORAL_TABLET | Freq: Once | ORAL | Status: AC
  Administered 2017-06-19: 1 via ORAL
  Filled 2017-06-19: qty 1

## 2017-06-19 NOTE — ED Triage Notes (Signed)
Pain over 1 week with urination. Reports frequency. Denies vaginal d/c. States "I feel like there is a rock near my urethra"

## 2017-06-19 NOTE — ED Provider Notes (Signed)
[facility] EMERGENCY DEPARTMENT Provider Note   CSN: [id number] Arrival date & time: 06/19/17  2126     History   Chief Complaint Chief Complaint  Patient presents with  . Dysuria    HPI Amy Church is a 32 y.o. female.  Patient presents with proximally 1 week of pain with urination.  She states that there is a burning pain feeling like "a rock" at the outlet of her urethra.  No difficulty with urination.  She does report blood in the urine.  No fevers, nausea, vomiting.  She denies vaginal discharge.  Denies trauma.  Patient is sexually active with one female partner. LMP 1 week ago, only lasted 1 day per patient. The onset of this condition was acute. The course is constant. Aggravating factors: none. Alleviating factors: none.       Past Medical History:  Diagnosis Date  . Hypertension   . No pertinent past medical history     Patient Active Problem List   Diagnosis Date Noted  . Pelvic pain 11/08/2016    Past Surgical History:  Procedure Laterality Date  . NO PAST SURGERIES       OB History    Gravida  3   Para  1   Term  1   Preterm      AB  2   Living  1     SAB      TAB  1   Ectopic      Multiple      Live Births  1            Home Medications    Prior to Admission medications   Medication Sig Start Date End Date Taking? Authorizing Provider  Multiple Vitamins-Minerals (HAIR SKIN AND NAILS FORMULA PO) Take 2 tablets by mouth daily.   Yes [provider]  amLODipine (NORVASC) 5 MG tablet Take 5 mg by mouth daily.    [provider]  aspirin 325 MG tablet Take 1,300 mg by mouth once.    [provider]  doxycycline (VIBRAMYCIN) 100 MG capsule Take 1 capsule (100 mg total) by mouth 2 (two) times daily. 11/08/16   Reva Bores, MD  Lurasidone HCl (LATUDA) 20 MG TABS Take 10 mg by mouth daily.    [provider]  Multiple Vitamin (MULTIVITAMIN WITH MINERALS) TABS tablet Take 2 tablets  by mouth daily.    [provider]    Family History Family History  Problem Relation Age of Onset  . Diabetes Paternal Grandmother   . Hypertension Paternal Grandmother     Social History Social History   Tobacco Use  . Smoking status: Current Every Day Smoker    Packs/day: 0.50    Types: Cigarettes    Last attempt to quit: 09/07/2016    Years since quitting: 0.7  . Smokeless tobacco: Never Used  Substance Use Topics  . Alcohol use: No  . Drug use: Not Currently    Types: Marijuana    Comment: Last marijuana 2 weeks ago     Allergies   Penicillins   Review of Systems Review of Systems  Constitutional: Negative for fever.  HENT: Negative for rhinorrhea and sore throat.   Eyes: Negative for redness.  Respiratory: Negative for cough.   Cardiovascular: Negative for chest pain.  Gastrointestinal: Positive for abdominal pain. Negative for diarrhea, nausea and vomiting.  Genitourinary: Positive for dysuria and hematuria. Negative for frequency, urgency, vaginal bleeding and vaginal discharge.  Musculoskeletal: Negative  for myalgias.  Skin: Negative for rash.  Neurological: Negative for headaches.     Physical Exam Updated Vital Signs BP (!) 130/104 (BP Location: Right Arm)   Pulse 92   Temp 98.3 F (36.8 C) (Oral)   Resp 18   Ht 5\' 6"  (1.676 m)   Wt 127 kg (280 lb)   SpO2 100%   BMI 45.19 kg/m   Physical Exam  Constitutional: She appears well-developed and well-nourished.  HENT:  Head: Normocephalic and atraumatic.  Eyes: Conjunctivae are normal. Right eye exhibits no discharge. Left eye exhibits no discharge.  Neck: Normal range of motion. Neck supple.  Cardiovascular: Normal rate, regular rhythm and normal heart sounds.  Pulmonary/Chest: Effort normal and breath sounds normal.  Abdominal: Soft. There is tenderness. There is no rebound and no guarding.  Mild generalized, lower tenderness  Genitourinary: Vagina normal and uterus normal. Pelvic  exam was performed with patient supine. There is no rash, tenderness or lesion on the right labia. There is no rash, tenderness or lesion on the left labia. Cervix exhibits no motion tenderness and no discharge. Right adnexum displays no mass and no tenderness. Left adnexum displays no mass and no tenderness. No tenderness in the vagina. No vaginal discharge (thick white) found.  Neurological: She is alert.  Skin: Skin is warm and dry.  Psychiatric: She has a normal mood and affect.  Nursing note and vitals reviewed.    ED Treatments / Results  Labs (all labs ordered are listed, but only abnormal results are displayed) Labs Reviewed  WET PREP, GENITAL - Abnormal; Notable for the following components:      Result Value   WBC, Wet Prep HPF POC MANY (*)    All other components within normal limits  URINALYSIS, ROUTINE W REFLEX MICROSCOPIC - Abnormal; Notable for the following components:   APPearance CLOUDY (*)    Hgb urine dipstick LARGE (*)    Bilirubin Urine SMALL (*)    Ketones, ur 15 (*)    All other components within normal limits  URINALYSIS, MICROSCOPIC (REFLEX) - Abnormal; Notable for the following components:   Bacteria, UA MANY (*)    Squamous Epithelial / LPF 6-30 (*)    All other components within normal limits  PREGNANCY, URINE  BASIC METABOLIC PANEL  GC/CHLAMYDIA PROBE AMP ([place]) NOT AT [facility]    EKG None  Radiology No results found.  Procedures Procedures (including critical care time)  Medications Ordered in ED Medications  HYDROcodone-acetaminophen (NORCO/VICODIN) 5-325 MG per tablet 1 tablet (1 tablet Oral Given 06/19/17 2321)     Initial Impression / Assessment and Plan / ED Course  I have reviewed the triage vital signs and the nursing notes.  Pertinent labs & imaging results that were available during my care of the patient were reviewed by me and considered in my medical decision making (see chart for details).    Patient seen and examined.  Work-up initiated. Medications ordered.   Vital signs reviewed and are as follows: BP (!) 130/104 (BP Location: Right Arm)   Pulse 92   Temp 98.3 F (36.8 C) (Oral)   Resp 18   Ht 5\' 6"  (1.676 m)   Wt 127 kg (280 lb)   SpO2 100%   BMI 45.19 kg/m   Pelvic exam performed by Moshe Cipro PA-S2, under my direct supervision with RN chaperone, Joss.   No obvious external findings, abscess, swelling. No significant tenderness to suggest PID. Wet prep pending.   11:37 PM Patient informed  of results. She will need GYN f/u. She states that her urine stream is less than normal, states she is 'drizzling'. With blood in urine, decreased stream, recent flank pain, do not feel it is unreasonable to check for renal or bladder stones.  Patient will be sent to CT.  Dr. Wilkie AyeHorton to follow-up on results.  If neg, plan to have her f/u with GYN.   Final Clinical Impressions(s) / ED Diagnoses   Final diagnoses:  Dysuria  Gross hematuria   Pending completion of work-up.   ED Discharge Orders    None       Renne CriglerGeiple, Alexandr Yaworski, Cordelia Poche-C 06/19/17 2349    Wilkie AyeHorton, Mayer Maskerourtney F, MD 06/20/17 [phone]

## 2017-06-20 LAB — BASIC METABOLIC PANEL
ANION GAP: 8 (ref 5–15)
BUN: 10 mg/dL (ref 6–20)
CALCIUM: 8.7 mg/dL — AB (ref 8.9–10.3)
CO2: 23 mmol/L (ref 22–32)
Chloride: 105 mmol/L (ref 101–111)
Creatinine, Ser: 0.76 mg/dL (ref 0.44–1.00)
GFR calc non Af Amer: >60 mL/min (ref >60)
Glucose, Bld: 103 mg/dL — ABNORMAL HIGH (ref 65–99)
POTASSIUM: 3.1 mmol/L — AB (ref 3.5–5.1)
Sodium: 136 mmol/L (ref 135–145)

## 2017-06-20 LAB — GC/CHLAMYDIA PROBE AMP (~~LOC~~) NOT AT ARMC
CHLAMYDIA, DNA PROBE: NEGATIVE
NEISSERIA GONORRHEA: NEGATIVE

## 2017-06-20 MED ORDER — PHENAZOPYRIDINE HCL 100 MG PO TABS
ORAL_TABLET | ORAL | Status: AC
  Filled 2017-06-20: qty 1

## 2017-06-20 MED ORDER — PHENAZOPYRIDINE HCL 100 MG PO TABS: 200.0000 mg | ORAL_TABLET | Freq: Three times a day (TID) | ORAL | 0 refills | Status: DC | PRN

## 2017-06-20 MED ORDER — NITROFURANTOIN MONOHYD MACRO 100 MG PO CAPS
ORAL_CAPSULE | ORAL | Status: AC
  Administered 2017-06-20: 100 mg
  Filled 2017-06-20: qty 1

## 2017-06-20 MED ORDER — NITROFURANTOIN MONOHYD MACRO 100 MG PO CAPS
100.0000 mg | ORAL_CAPSULE | Freq: Two times a day (BID) | ORAL | 0 refills | Status: DC
Start: 2017-06-20 — End: 2018-03-05

## 2017-06-20 MED ORDER — PHENAZOPYRIDINE HCL 100 MG PO TABS
95.0000 mg | ORAL_TABLET | Freq: Once | ORAL | Status: AC
  Administered 2017-06-20: 100 mg via ORAL

## 2017-06-20 NOTE — Discharge Instructions (Addendum)
You were seen today for urinary symptoms.  You have evidence of blood in your urine.  You may have an infection.  He will be treated for this.  You need to follow-up with OB/GYN if symptoms persist.

## 2017-06-20 NOTE — ED Notes (Signed)
Patient continue to complain of vaginal pain.  Prescription for antibiotic and pyridium given.  [facility] or make an appointment with an OBGYN so she can be seen.  Patient her her cousin verbalized understanding.

## 2017-06-21 LAB — URINE CULTURE

## 2017-06-22 ENCOUNTER — Telehealth: Payer: Self-pay | Admitting: Emergency Medicine

## 2017-06-22 NOTE — Telephone Encounter (Signed)
Post ED Visit - Positive Culture Follow-up  Culture report reviewed by antimicrobial stewardship pharmacist:  []  Enzo BiNathan Batchelder, Pharm.D. []  Celedonio MiyamotoJeremy Frens, Pharm.D., BCPS AQ-ID []  Garvin FilaMike Maccia, Pharm.D., BCPS []  Georgina PillionElizabeth Martin, [address]Ph[address] BCPS []  BangorMinh Pham, [address]Ph[address]D., BCPS, AAHIVP []  Estella HuskMichelle Turner, Pharm.D., BCPS, AAHIVP []  Lysle Pearlachel Rumbarger, PharmD, BCPS []  Blake DivineShannon Parkey, PharmD []  Pollyann SamplesAndy Johnston, PharmD, BCPS Sharin MonsEmily Sinclair PharmD  Positive urine culture Treated with nitrofurantoin, organism sensitive to the same and no further patient follow-up is required at this time.  Berle MullMiller, Aasir Daigler 06/22/2017, 1:43 PM

## 2017-10-04 ENCOUNTER — Encounter (HOSPITAL_COMMUNITY): Payer: Self-pay | Admitting: Family Medicine

## 2017-10-04 ENCOUNTER — Emergency Department (HOSPITAL_COMMUNITY)
Admission: EM | Admit: 2017-10-04 | Discharge: 2017-10-05 | Disposition: A | Discharge status: Home / Self Care | Payer: Managed Care, Other (non HMO) | Attending: Emergency Medicine | Admitting: Emergency Medicine

## 2017-10-04 DIAGNOSIS — G44209 Tension-type headache, unspecified, not intractable: Secondary | ICD-10-CM | POA: Diagnosis not present

## 2017-10-04 DIAGNOSIS — I10 Essential (primary) hypertension: Secondary | ICD-10-CM | POA: Insufficient documentation

## 2017-10-04 DIAGNOSIS — R51 Headache: Secondary | ICD-10-CM | POA: Diagnosis present

## 2017-10-04 DIAGNOSIS — Z87891 Personal history of nicotine dependence: Secondary | ICD-10-CM | POA: Insufficient documentation

## 2017-10-04 DIAGNOSIS — F1210 Cannabis abuse, uncomplicated: Secondary | ICD-10-CM | POA: Insufficient documentation

## 2017-10-04 DIAGNOSIS — Z79899 Other long term (current) drug therapy: Secondary | ICD-10-CM | POA: Insufficient documentation

## 2017-10-04 DIAGNOSIS — H53149 Visual discomfort, unspecified: Secondary | ICD-10-CM | POA: Diagnosis not present

## 2017-10-04 DIAGNOSIS — R112 Nausea with vomiting, unspecified: Secondary | ICD-10-CM | POA: Insufficient documentation

## 2017-10-04 LAB — I-STAT BETA HCG BLOOD, ED (MC, WL, AP ONLY)

## 2017-10-04 MED ORDER — SODIUM CHLORIDE 0.9 % IV BOLUS
1000.0000 mL | Freq: Once | INTRAVENOUS | Status: AC
  Administered 2017-10-04: 1000 mL via INTRAVENOUS

## 2017-10-04 MED ORDER — DEXAMETHASONE SODIUM PHOSPHATE 10 MG/ML IJ SOLN
10.0000 mg | Freq: Once | INTRAMUSCULAR | Status: AC
  Administered 2017-10-04: 10 mg via INTRAVENOUS
  Filled 2017-10-04: qty 1

## 2017-10-04 MED ORDER — KETOROLAC TROMETHAMINE 30 MG/ML IJ SOLN
30.0000 mg | Freq: Once | INTRAMUSCULAR | Status: AC
  Administered 2017-10-04: 30 mg via INTRAVENOUS
  Filled 2017-10-04: qty 1

## 2017-10-04 MED ORDER — METOCLOPRAMIDE HCL 5 MG/ML IJ SOLN
10.0000 mg | Freq: Once | INTRAMUSCULAR | Status: AC
  Administered 2017-10-04: 10 mg via INTRAVENOUS
  Filled 2017-10-04: qty 2

## 2017-10-04 NOTE — ED Provider Notes (Signed)
[facility]-EMERGENCY DEPT Provider Note   CSN: [id number] Arrival date & time: 10/04/17  2114     History   Chief Complaint Chief Complaint  Patient presents with  . Headache    HPI Amy Church is a 32 y.o. female.  32 year old female with a history of hypertension presents to the emergency department for evaluation of headache.  She reports a frontal headache for the past 4 days associated with photophobia as well as nausea and vomiting.  Nausea and vomiting have been intermittent, but headache has remained constant.  She took ibuprofen for symptoms without relief.  She describes her pain as a pressure-like sensation, similar to a band squeezing her head.  She has not had any recent head injury or trauma.  No recent fevers or associated vision loss, hearing loss, difficulty speaking or swallowing, extremity numbness or weakness; does note 1 episode of fleeting paraesthesias in her LUE which have now resolved.  No inability to ambulate.  As an aside, patient expressing concern for pregnancy.  LMP was in early June.  Denies abdominal pain and vaginal bleeding.     Past Medical History:  Diagnosis Date  . Hypertension   . No pertinent past medical history     Patient Active Problem List   Diagnosis Date Noted  . Pelvic pain 11/08/2016    Past Surgical History:  Procedure Laterality Date  . NO PAST SURGERIES       OB History    Gravida  3   Para  1   Term  1   Preterm      AB  2   Living  1     SAB      TAB  1   Ectopic      Multiple      Live Births  1            Home Medications    Prior to Admission medications   Medication Sig Start Date End Date Taking? Authorizing Provider  amLODipine (NORVASC) 5 MG tablet Take 5 mg by mouth daily.   Yes [provider]  ibuprofen (ADVIL,MOTRIN) 200 MG tablet Take 800 mg by mouth daily as needed for moderate pain.   Yes [provider]  nitrofurantoin,  macrocrystal-monohydrate, (MACROBID) 100 MG capsule Take 1 capsule (100 mg total) by mouth 2 (two) times daily. Patient not taking: Reported on 10/04/2017 06/20/17   Horton, Mayer Maskerourtney F, MD  phenazopyridine (PYRIDIUM) 100 MG tablet Take 2 tablets (200 mg total) by mouth 3 (three) times daily as needed for pain. Patient not taking: Reported on 10/04/2017 06/20/17   Horton, Mayer Maskerourtney F, MD    Family History Family History  Problem Relation Age of Onset  . Diabetes Paternal Grandmother   . Hypertension Paternal Grandmother     Social History Social History   Tobacco Use  . Smoking status: Former Smoker    Packs/day: 0.50    Types: Cigarettes  . Smokeless tobacco: Never Used  Substance Use Topics  . Alcohol use: No  . Drug use: Yes    Types: Marijuana    Comment: Last used: 2 weeks ago      Allergies   Penicillins   Review of Systems Review of Systems Ten systems reviewed and are negative for acute change, except as noted in the HPI.    Physical Exam Updated Vital Signs BP (!) 139/94   Pulse 88   Temp 98.5 F (36.9 C) (Oral)   Resp 18  Ht 5\' 10"  (1.778 m)   Wt 122 kg (269 lb)   SpO2 100%   BMI 38.60 kg/m   Physical Exam  Constitutional: She is oriented to person, place, and time. She appears well-developed and well-nourished. No distress.  Nontoxic appearing and in NAD  HENT:  Head: Normocephalic and atraumatic.  Mouth/Throat: Oropharynx is clear and moist.  Symmetric rise of the uvula with phonation  Eyes: Pupils are equal, round, and reactive to light. Conjunctivae and EOM are normal. No scleral icterus.  Neck: Normal range of motion.  No meningismus  Pulmonary/Chest: Effort normal. No respiratory distress.  Respirations even and unlabored  Musculoskeletal: Normal range of motion.  Neurological: She is alert and oriented to person, place, and time. No cranial nerve deficit. She exhibits normal muscle tone. Coordination normal.  GCS 15. Speech is goal oriented.  No cranial nerve deficits appreciated; symmetric eyebrow raise, no facial drooping, tongue midline. Patient has equal grip strength bilaterally with 5/5 strength against resistance in all major muscle groups bilaterally. Sensation to light touch intact. Patient moves extremities without ataxia.   Skin: Skin is warm and dry. No rash noted. She is not diaphoretic. No erythema. No pallor.  Psychiatric: She has a normal mood and affect. Her behavior is normal.  Nursing note and vitals reviewed.    ED Treatments / Results  Labs (all labs ordered are listed, but only abnormal results are displayed) Labs Reviewed  I-STAT BETA HCG BLOOD, ED ([facility], [facility], [facility] ONLY)    EKG None  Radiology No results found.  Procedures Procedures (including critical care time)  Medications Ordered in ED Medications  metoCLOPramide (REGLAN) injection 10 mg (10 mg Intravenous Given 10/04/17 2239)  sodium chloride 0.9 % bolus 1,000 mL (0 mLs Intravenous Stopped 10/05/17 0017)  dexamethasone (DECADRON) injection 10 mg (10 mg Intravenous Given 10/04/17 2332)  ketorolac (TORADOL) 30 MG/ML injection 30 mg (30 mg Intravenous Given 10/04/17 2332)    12:20 AM Patient reassessed.  She states that headache has completely resolved.  She expresses comfort with discharge.  Thankful for care received.   Initial Impression / Assessment and Plan / ED Course  I have reviewed the triage vital signs and the nursing notes.  Pertinent labs & imaging results that were available during my care of the patient were reviewed by me and considered in my medical decision making (see chart for details).     Patient presents to the emergency department for evaluation of headache which began 4 days ago.  Patient with no history of recent head injury or trauma.  No fever, nuchal rigidity, meningismus to suggest meningitis.  Neurologic exam today is nonfocal.    On reassessment, the patient has had significant improvement in headache symptoms  following a migraine cocktail.  I do not believe further emergent workup is indicated at this time.  Return precautions discussed and provided.  Patient discharged in stable condition with no unaddressed concerns.   Final Clinical Impressions(s) / ED Diagnoses   Final diagnoses:  Tension headache    ED Discharge Orders    None       Antony Madura, PA-C 10/05/17 Tammi Sou, MD 10/05/17 1304

## 2017-10-04 NOTE — ED Triage Notes (Signed)
Patient is complaining of a headache with nausea and vomiting. Symptoms started 3 days ago. Patient took Advil 800mg  about 2-3 hours ago with no relief.

## 2017-10-05 NOTE — Discharge Instructions (Signed)
Take Excedrin Migraine for any persistent headache.  Follow-up with your primary care doctor.  You may return for new or concerning symptoms.

## 2018-03-01 NOTE — L&D Delivery Note (Signed)
Operative Delivery Note  Upon arrival in room, and positioning pt in supine position for exam, the pt began severe variable decels. Position changes followed by floating the bladder foley above the vertex was done.  Decels recurred, then responded to slight elevation of vertex between contractions. Upon resuming pushing the vacuum was used , had 2 popoffs, the first due to inadequate seal,  After the second effort, the pt rested till the next contraction then expelled the baby spontaneously. At 12:40 PM a viable and healthy female was delivered via Vaginal dssisted descent from +2-+3, ,[facility] Vacuum Neurosurgeon).  Presentation: vertex; Position: Occiput,, Anterior; Station: +3.  Verbal consent: obtained from patient.  Risks and benefits discussed in detail.  Risks include, but are not limited to the risks of anesthesia, bleeding, infection, damage to maternal tissues, fetal cephalhematoma.  There is also the risk of inability to effect vaginal delivery of the head, or shoulder dystocia that cannot be resolved by established maneuvers, leading to the need for emergency cesarean section.  APGAR: , ; weight  .   Placenta status: , .  spont intact schultz Cord:  with the following complications: .  Cord pH: not done  Anesthesia:   Instruments: Kiwi Episiotomy: None Lacerations: 2nd degree;Perineal single stitch Suture Repair:3-0 moncryl Est. Blood Loss (mL):    Mom to postpartum.  Baby to Couplet care / Skin to Skin. Mother for PP BTL. Jonnie Kind 02/24/2019, 1:10 PM

## 2018-03-05 ENCOUNTER — Ambulatory Visit (HOSPITAL_COMMUNITY)
Admission: EM | Admit: 2018-03-05 | Discharge: 2018-03-05 | Disposition: A | Discharge status: Home / Self Care | Payer: Managed Care, Other (non HMO) | Attending: Physician Assistant | Admitting: Physician Assistant

## 2018-03-05 ENCOUNTER — Encounter (HOSPITAL_COMMUNITY): Payer: Self-pay | Admitting: *Deleted

## 2018-03-05 DIAGNOSIS — M545 Low back pain, unspecified: Secondary | ICD-10-CM

## 2018-03-05 DIAGNOSIS — W000XXA Fall on same level due to ice and snow, initial encounter: Secondary | ICD-10-CM | POA: Diagnosis not present

## 2018-03-05 DIAGNOSIS — W010XXA Fall on same level from slipping, tripping and stumbling without subsequent striking against object, initial encounter: Secondary | ICD-10-CM | POA: Diagnosis not present

## 2018-03-05 MED ORDER — KETOROLAC TROMETHAMINE 30 MG/ML IJ SOLN
30.0000 mg | Freq: Once | INTRAMUSCULAR | Status: AC
  Administered 2018-03-05: 30 mg via INTRAMUSCULAR

## 2018-03-05 MED ORDER — KETOROLAC TROMETHAMINE 30 MG/ML IJ SOLN
INTRAMUSCULAR | Status: AC
  Filled 2018-03-05: qty 1

## 2018-03-05 MED ORDER — PREDNISONE 50 MG PO TABS: 50.0000 mg | ORAL_TABLET | Freq: Every day | ORAL | 0 refills | Status: DC

## 2018-03-05 MED ORDER — METHOCARBAMOL 500 MG PO TABS: 500.0000 mg | ORAL_TABLET | Freq: Two times a day (BID) | ORAL | 0 refills | Status: DC

## 2018-03-05 NOTE — ED Triage Notes (Signed)
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  C/O continued right lower back pain and muscle spasms.  Since yesterday has had tingling into RLE.

## 2018-03-05 NOTE — ED Provider Notes (Signed)
[facility]    CSN: [id number] Arrival date & time: 03/05/18  1231     History   Chief Complaint Chief Complaint  Patient presents with  . Fall  . Back Pain    HPI Amy Church is a 33 y.o. female.   33 year old female comes in for evaluation of right back pain after fall a week ago.  States she was work last Thursday, slipped on a ramp due to ice and fell.  States hit the back of her head without loss of consciousness.  She had headaches after the injury, but states has since resolved.  Denies any dizziness, weakness, syncope.  States right lower back pain has been gradually worsening, now with tingling sensation down the leg.  She denies saddle anesthesia, loss of bladder or bowel control.  Has been taking Tylenol, topical analgesics, heat compress without relief. Feels spasms to the back.      Past Medical History:  Diagnosis Date  . Hypertension     Patient Active Problem List   Diagnosis Date Noted  . Pelvic pain 11/08/2016    Past Surgical History:  Procedure Laterality Date  . NO PAST SURGERIES      OB History    Gravida  3   Para  1   Term  1   Preterm      AB  2   Living  1     SAB      TAB  1   Ectopic      Multiple      Live Births  1            Home Medications    Prior to Admission medications   Medication Sig Start Date End Date Taking? Authorizing Provider  amLODipine (NORVASC) 5 MG tablet Take 5 mg by mouth daily.   Yes [provider]  ibuprofen (ADVIL,MOTRIN) 200 MG tablet Take 800 mg by mouth daily as needed for moderate pain.    [provider]  methocarbamol (ROBAXIN) 500 MG tablet Take 1 tablet (500 mg total) by mouth 2 (two) times daily. 03/05/18   Cathie Hoops, Tyrese Ficek V, PA-C  predniSONE (DELTASONE) 50 MG tablet Take 1 tablet (50 mg total) by mouth daily. 03/05/18   Belinda Fisher, PA-C    Family History Family History  Problem Relation Age of Onset  . Diabetes Paternal Grandmother   . Hypertension  Paternal Grandmother     Social History Social History   Tobacco Use  . Smoking status: Former Smoker    Packs/day: 0.50    Types: Cigarettes  . Smokeless tobacco: Never Used  Substance Use Topics  . Alcohol use: No  . Drug use: Not Currently    Types: Marijuana     Allergies   Penicillins   Review of Systems Review of Systems  Reason unable to perform ROS: See HPI as above.     Physical Exam Triage Vital Signs ED Triage Vitals  Enc Vitals Group     BP 03/05/18 1418 (!) 137/96     Pulse Rate 03/05/18 1418 86     Resp --      Temp 03/05/18 1418 97.8 F (36.6 C)     Temp src --      SpO2 03/05/18 1418 100 %     Weight --      Height --      Head Circumference --      Peak Flow --      Pain  Score 03/05/18 1419 10     Pain Loc --      Pain Edu? --      Excl. in GC? --    No data found.  Updated Vital Signs BP (!) 137/96   Pulse 86   Temp 97.8 F (36.6 C)   LMP 03/03/2018 (Exact Date)   SpO2 100%   Physical Exam Constitutional:      General: She is not in acute distress.    Appearance: She is well-developed. She is not diaphoretic.  HENT:     Head: Normocephalic and atraumatic.  Eyes:     Conjunctiva/sclera: Conjunctivae normal.     Pupils: Pupils are equal, round, and reactive to light.  Cardiovascular:     Rate and Rhythm: Normal rate and regular rhythm.     Heart sounds: Normal heart sounds. No murmur. No friction rub. No gallop.   Pulmonary:     Effort: Pulmonary effort is normal. No accessory muscle usage or respiratory distress.     Breath sounds: Normal breath sounds. No stridor. No decreased breath sounds, wheezing, rhonchi or rales.  Musculoskeletal:     Comments: No tenderness on palpation of the spinous processes.  Diffuse tenderness to palpation of right lumbar region.  No tenderness to palpation of hips.  Decreased rotation to the right back.  Full range of motion of hips. Strength normal and equal bilaterally. Sensation intact and  equal bilaterally. Negative straight leg raise.   Skin:    General: Skin is warm and dry.  Neurological:     Mental Status: She is alert and oriented to person, place, and time.     UC Treatments / Results  Labs (all labs ordered are listed, but only abnormal results are displayed) Labs Reviewed - No data to display  EKG None  Radiology No results found.  Procedures Procedures (including critical care time)  Medications Ordered in UC Medications  ketorolac (TORADOL) 30 MG/ML injection 30 mg (has no administration in time range)    Initial Impression / Assessment and Plan / UC Course  I have reviewed the triage vital signs and the nursing notes.  Pertinent labs & imaging results that were available during my care of the patient were reviewed by me and considered in my medical decision making (see chart for details).    Toradol injection in office today. Prednisone as directed. Muscle relaxant as needed. Ice/heat compresses. Discussed with patient strain can take up to 3-4 weeks to resolve, but should be getting better each week. Return precautions given.   Final Clinical Impressions(s) / UC Diagnoses   Final diagnoses:  Acute right-sided low back pain without sciatica    ED Prescriptions    Medication Sig Dispense Auth. Provider   predniSONE (DELTASONE) 50 MG tablet Take 1 tablet (50 mg total) by mouth daily. 5 tablet Carvel Huskins V, PA-C   methocarbamol (ROBAXIN) 500 MG tablet Take 1 tablet (500 mg total) by mouth 2 (two) times daily. 20 tablet Threasa Alpha, [place] 03/05/18 [id number]

## 2018-03-05 NOTE — Discharge Instructions (Addendum)
Toradol injection in office today. Prednisone as directed. Robaxin as needed, this can make you drowsy, so do not take if you are going to drive, operate heavy machinery, or make important decisions. Ice/heat compresses as needed. This can take up to 3-4 weeks to completely resolve, but you should be feeling better each week. Follow up here or with PCP if symptoms worsen, changes for reevaluation. If experience numbness/tingling of the inner thighs, loss of bladder or bowel control, go to the emergency department for evaluation.

## 2018-03-20 ENCOUNTER — Encounter: Payer: Self-pay | Admitting: Family Medicine

## 2018-03-22 ENCOUNTER — Ambulatory Visit: Payer: Medicaid Other | Admitting: Family Medicine

## 2018-04-26 ENCOUNTER — Ambulatory Visit (HOSPITAL_COMMUNITY)
Admission: EM | Admit: 2018-04-26 | Discharge: 2018-04-26 | Disposition: A | Discharge status: Home / Self Care | Payer: Medicaid Other | Attending: Emergency Medicine | Admitting: Emergency Medicine

## 2018-04-26 DIAGNOSIS — S39012D Strain of muscle, fascia and tendon of lower back, subsequent encounter: Secondary | ICD-10-CM

## 2018-04-26 DIAGNOSIS — R350 Frequency of micturition: Secondary | ICD-10-CM | POA: Diagnosis not present

## 2018-04-26 LAB — POCT URINALYSIS DIP (DEVICE)
BILIRUBIN URINE: NEGATIVE
Glucose, UA: NEGATIVE mg/dL
HGB URINE DIPSTICK: NEGATIVE
Ketones, ur: NEGATIVE mg/dL
LEUKOCYTE UA: NEGATIVE
NITRITE: NEGATIVE
Protein, ur: NEGATIVE mg/dL
SPECIFIC GRAVITY, URINE: 1.015 (ref 1.005–1.030)
Urobilinogen, UA: 0.2 mg/dL (ref 0.0–1.0)
pH: 5.5 (ref 5.0–8.0)

## 2018-04-26 MED ORDER — KETOROLAC TROMETHAMINE 30 MG/ML IJ SOLN
30.0000 mg | Freq: Once | INTRAMUSCULAR | Status: AC
  Administered 2018-04-26: 30 mg via INTRAMUSCULAR

## 2018-04-26 MED ORDER — CIPROFLOXACIN HCL 500 MG PO TABS: 500.0000 mg | ORAL_TABLET | Freq: Two times a day (BID) | ORAL | 0 refills | Status: DC

## 2018-04-26 MED ORDER — KETOROLAC TROMETHAMINE 30 MG/ML IJ SOLN
INTRAMUSCULAR | Status: AC
Start: 2018-04-26 — End: ?
  Filled 2018-04-26: qty 1

## 2018-04-26 MED ORDER — PREDNISONE 10 MG (21) PO TBPK: ORAL_TABLET | Freq: Every day | ORAL | 0 refills | Status: DC

## 2018-04-26 NOTE — ED Triage Notes (Signed)
Pt cc she moved her couch she has back pain x 3 days.

## 2018-04-26 NOTE — ED Provider Notes (Addendum)
[facility]    CSN: [id number] Arrival date & time: 04/26/18  1813     History   Chief Complaint Chief Complaint  Patient presents with  . Back Pain    HPI Amy Church is a 33 y.o. female.   Pt states that she has had several different times of lower back pain more so with lift and pulling. She has came and received a tordol shot and it helps. This time she was moving a couch and noticed pain to lower back. Able to walk no loss of urine. Pain more so with flexion. Also states that she has frequency and wanted to be check for a uti. Denies any risk of preg.      Past Medical History:  Diagnosis Date  . Chronic fungal otitis externa   . Hypertension   . Temporomandibular jaw dysfunction     Patient Active Problem List   Diagnosis Date Noted  . Pelvic pain 11/08/2016    Past Surgical History:  Procedure Laterality Date  . NO PAST SURGERIES      OB History    Gravida  3   Para  1   Term  1   Preterm      AB  2   Living  1     SAB      TAB  1   Ectopic      Multiple      Live Births  1            Home Medications    Prior to Admission medications   Medication Sig Start Date End Date Taking? Authorizing Provider  amLODipine (NORVASC) 5 MG tablet Take 5 mg by mouth daily.    [provider]  ciprofloxacin (CIPRO) 500 MG tablet Take 1 tablet (500 mg total) by mouth 2 (two) times daily. 04/26/18   Coralyn Mark, NP  methocarbamol (ROBAXIN) 500 MG tablet Take 1 tablet (500 mg total) by mouth 2 (two) times daily. 03/05/18   Cathie Hoops, Amy V, PA-C  predniSONE (STERAPRED UNI-PAK 21 TAB) 10 MG (21) TBPK tablet Take by mouth daily. Take 6 tabs by mouth daily  for 2 days, then 5 tabs for 2 days, then 4 tabs for 2 days, then 3 tabs for 2 days, 2 tabs for 2 days, then 1 tab by mouth daily for 2 days 04/26/18   Coralyn Mark, NP    Family History Family History  Problem Relation Age of Onset  . Diabetes Paternal Grandmother   .  Hypertension Paternal Grandmother     Social History Social History   Tobacco Use  . Smoking status: Former Smoker    Packs/day: 0.50    Types: Cigarettes  . Smokeless tobacco: Never Used  Substance Use Topics  . Alcohol use: No  . Drug use: Not Currently    Types: Marijuana     Allergies   Penicillins   Review of Systems Review of Systems  Constitutional: Negative.   Respiratory: Negative.   Cardiovascular: Negative.   Gastrointestinal: Negative.   Genitourinary: Positive for frequency.  Musculoskeletal: Positive for back pain.  Skin: Negative.   Neurological: Negative.      Physical Exam Triage Vital Signs ED Triage Vitals [04/26/18 1854]  Enc Vitals Group     BP (!) 139/105     Pulse Rate 89     Resp 18     Temp 97.7 F (36.5 C)     Temp Source Tympanic  SpO2 98 %     Weight      Height      Head Circumference      Peak Flow      Pain Score      Pain Loc      Pain Edu?      Excl. in GC?    No data found.  Updated Vital Signs BP (!) 139/105 (BP Location: Right Arm)   Pulse 89   Temp 97.7 F (36.5 C) (Tympanic)   Resp 18   SpO2 98%   Visual Acuity     Physical Exam Cardiovascular:     Rate and Rhythm: Normal rate.     Pulses: Normal pulses.  Pulmonary:     Effort: Pulmonary effort is normal.  Abdominal:     General: Abdomen is flat. Bowel sounds are normal.     Comments: Denies any cv tnederness midline   Musculoskeletal:        General: Tenderness present.     Comments: Tenderness to mid lower spine area more so with flexion   Skin:    General: Skin is warm.     Capillary Refill: Capillary refill takes less than 2 seconds.  Neurological:     General: No focal deficit present.     Mental Status: She is alert.      UC Treatments / Results  Labs (all labs ordered are listed, but only abnormal results are displayed) Labs Reviewed  POCT URINALYSIS DIP (DEVICE)    EKG None  Radiology No results  found.  Procedures Procedures (including critical care time)  Medications Ordered in UC Medications  ketorolac (TORADOL) 30 MG/ML injection 30 mg (30 mg Intramuscular Given 04/26/18 1913)    Initial Impression / Assessment and Plan / UC Course  I have reviewed the triage vital signs and the nursing notes.  Pertinent labs & imaging results that were available during my care of the patient were reviewed by me and considered in my medical decision making (see chart for details).     Use warm compresses to lower back  Rest area  Will need to follow up with ortho since you are having this pain more frequently.  We discussed that we will send urine off for culture and I will give a rx for uti if symptoms become worse then fill. They will call you will results of culture. Reviewed previous chart with same sx in jan 2020  Final Clinical Impressions(s) / UC Diagnoses   Final diagnoses:  Strain of lumbar region, subsequent encounter  Frequency of urination     Discharge Instructions     Use warm compresses to lower back  Rest area  Will need to follow up with ortho since you are having this pain more frequently.  We discussed that we will send urine off for culture and I will give a rx for uti if symptoms become worse then fill. They will call you will results of culture.     ED Prescriptions    Medication Sig Dispense Auth. Provider   predniSONE (STERAPRED UNI-PAK 21 TAB) 10 MG (21) TBPK tablet Take by mouth daily. Take 6 tabs by mouth daily  for 2 days, then 5 tabs for 2 days, then 4 tabs for 2 days, then 3 tabs for 2 days, 2 tabs for 2 days, then 1 tab by mouth daily for 2 days 42 tablet Maple Mirza L, NP   ciprofloxacin (CIPRO) 500 MG tablet Take 1 tablet (500 mg total)  by mouth 2 (two) times daily. 14 tablet Coralyn Mark, NP     Controlled Substance Prescriptions [place] Controlled Substance Registry consulted? Not Applicable   Coralyn Mark, NP 04/26/18  1918    Coralyn Mark, NP 04/26/18 1919

## 2018-04-26 NOTE — Discharge Instructions (Addendum)
Use warm compresses to lower back  Rest area  Will need to follow up with ortho since you are having this pain more frequently.  We discussed that we will send urine off for culture and I will give a rx for uti if symptoms become worse then fill. They will call you will results of culture.

## 2018-07-07 ENCOUNTER — Other Ambulatory Visit: Payer: Self-pay

## 2018-07-07 ENCOUNTER — Ambulatory Visit (HOSPITAL_COMMUNITY)
Admission: EM | Admit: 2018-07-07 | Discharge: 2018-07-07 | Disposition: A | Discharge status: Home / Self Care | Payer: Medicaid Other | Attending: Emergency Medicine | Admitting: Emergency Medicine

## 2018-07-07 ENCOUNTER — Encounter (HOSPITAL_COMMUNITY): Payer: Self-pay

## 2018-07-07 DIAGNOSIS — N3090 Cystitis, unspecified without hematuria: Secondary | ICD-10-CM | POA: Insufficient documentation

## 2018-07-07 DIAGNOSIS — Z3202 Encounter for pregnancy test, result negative: Secondary | ICD-10-CM | POA: Diagnosis present

## 2018-07-07 DIAGNOSIS — R1031 Right lower quadrant pain: Secondary | ICD-10-CM | POA: Diagnosis not present

## 2018-07-07 LAB — POCT PREGNANCY, URINE: Preg Test, Ur: NEGATIVE

## 2018-07-07 LAB — POCT URINALYSIS DIP (DEVICE)
Bilirubin Urine: NEGATIVE
Glucose, UA: NEGATIVE mg/dL
Hgb urine dipstick: NEGATIVE
Ketones, ur: NEGATIVE mg/dL
Nitrite: NEGATIVE
Protein, ur: NEGATIVE mg/dL
Specific Gravity, Urine: 1.025 (ref 1.005–1.030)
Urobilinogen, UA: 0.2 mg/dL (ref 0.0–1.0)
pH: 6 (ref 5.0–8.0)

## 2018-07-07 MED ORDER — NITROFURANTOIN MONOHYD MACRO 100 MG PO CAPS: 100.0000 mg | ORAL_CAPSULE | Freq: Two times a day (BID) | ORAL | 0 refills | Status: AC

## 2018-07-07 NOTE — ED Provider Notes (Addendum)
[facility]    CSN: [id number] Arrival date & time: 07/07/18  1253     History   Chief Complaint Chief Complaint  Patient presents with   Possible Pregnancy   Urinary Tract Infection    HPI Amy Church is a 33 y.o. female.   Amy Church presents with complaints of bladder pressure with intermittent sharp RLQ pain for the past five days, worse before urinating. Has noted frequency as well. Urine has been cloudy. No back pain. No fevers. No nausea or vomiting. No vaginal symptoms. States she also is concerned about pregnancy as she has not had a cycle in approximately 2 months. States she has had two previous losses in the past and has a 31 year old daughter. She does smoke. She typically has regular periods. She is sexually active and doesn't use birth control. States in the past her urine pregnancy tests are always negative and she requires a blood HCG test.  Denies any vaginal symptoms. Takes medication for HTN.     ROS per HPI, negative if not otherwise mentioned.      Past Medical History:  Diagnosis Date   Chronic fungal otitis externa    Hypertension    Temporomandibular jaw dysfunction     Patient Active Problem List   Diagnosis Date Noted   Pelvic pain 11/08/2016    Past Surgical History:  Procedure Laterality Date   NO PAST SURGERIES      OB History    Gravida  3   Para  1   Term  1   Preterm      AB  2   Living  1     SAB      TAB  1   Ectopic      Multiple      Live Births  1            Home Medications    Prior to Admission medications   Medication Sig Start Date End Date Taking? Authorizing Provider  amLODipine (NORVASC) 5 MG tablet Take 5 mg by mouth daily.   Yes [provider]  nitrofurantoin, macrocrystal-monohydrate, (MACROBID) 100 MG capsule Take 1 capsule (100 mg total) by mouth 2 (two) times daily for 5 days. 07/07/18 07/12/18  Georgetta Haber, NP    Family History Family History    Problem Relation Age of Onset   Diabetes Paternal Grandmother    Hypertension Paternal Grandmother     Social History Social History   Tobacco Use   Smoking status: Light Tobacco Smoker    Packs/day: 0.50    Types: Cigarettes   Smokeless tobacco: Never Used  Substance Use Topics   Alcohol use: No   Drug use: Not Currently    Types: Marijuana     Allergies   Penicillins   Review of Systems Review of Systems   Physical Exam Triage Vital Signs ED Triage Vitals  Enc Vitals Group     BP 07/07/18 1346 133/90     Pulse --      Resp 07/07/18 1346 17     Temp 07/07/18 1346 98.9 F (37.2 C)     Temp Source 07/07/18 1346 Oral     SpO2 07/07/18 1346 99 %     Weight --      Height --      Head Circumference --      Peak Flow --      Pain Score 07/07/18 1343 3     Pain  Loc --      Pain Edu? --      Excl. in GC? --    No data found.  Updated Vital Signs BP 133/90 (BP Location: Left Arm)    Temp 98.9 F (37.2 C) (Oral)    Resp 17    LMP 04/26/2018 (Approximate)    SpO2 99%    Physical Exam Constitutional:      General: She is not in acute distress.    Appearance: She is well-developed.  Cardiovascular:     Rate and Rhythm: Normal rate and regular rhythm.     Heart sounds: Normal heart sounds.  Pulmonary:     Effort: Pulmonary effort is normal.     Breath sounds: Normal breath sounds.  Abdominal:     Palpations: Abdomen is soft. Abdomen is not rigid.     Tenderness: There is no abdominal tenderness. There is no right CVA tenderness, left CVA tenderness, guarding or rebound.  Genitourinary:    Comments: Denies sores, lesions, vaginal bleeding; no pelvic pain; gu exam deferred at this time, vaginal self swab collected.   Skin:    General: Skin is warm and dry.  Neurological:     Mental Status: She is alert and oriented to person, place, and time.      UC Treatments / Results  Labs (all labs ordered are listed, but only abnormal results are  displayed) Labs Reviewed  POCT URINALYSIS DIP (DEVICE) - Abnormal; Notable for the following components:      Result Value   Leukocytes,Ua TRACE (*)    All other components within normal limits  URINE CULTURE  POC URINE PREG, ED  POCT PREGNANCY, URINE    EKG None  Radiology No results found.  Procedures Procedures (including critical care time)  Medications Ordered in UC Medications - No data to display  Initial Impression / Assessment and Plan / UC Course  I have reviewed the triage vital signs and the nursing notes.  Pertinent labs & imaging results that were available during my care of the patient were reviewed by me and considered in my medical decision making (see chart for details).    Trace leukocytes with urinary symptoms. Urine culture pending. Negative urine pregnancy. Encouraged follow up with OB and/or pcp for management of amenorrhea as needed. Return precautions provided. Patient verbalized understanding and agreeable to plan.   Final Clinical Impressions(s) / UC Diagnoses   Final diagnoses:  Negative pregnancy test  Cystitis     Discharge Instructions     Drink plenty of water to empty bladder regularly. Avoid alcohol and caffeine as these may irritate the bladder.   Complete course of antibiotics.  I have sent your urine to be cultured to ensure antibiotic is correct choice.  Please follow up with PCP and/or ob for follow up if still no period in the next month.  Go to ER if develop worsening of pain, fevers, or vaginal bleeding.    ED Prescriptions    Medication Sig Dispense Auth. Provider   nitrofurantoin, macrocrystal-monohydrate, (MACROBID) 100 MG capsule Take 1 capsule (100 mg total) by mouth 2 (two) times daily for 5 days. 10 capsule Georgetta HaberBurky, Allan Minotti B, NP     Controlled Substance Prescriptions [place] Controlled Substance Registry consulted? Not Applicable   Georgetta HaberBurky, Patrese Neal B, NP 07/07/18 1534       Linus MakoBurky, Ezabella Teska B, NP 07/07/18 1547

## 2018-07-07 NOTE — ED Triage Notes (Signed)
Patient presents to Urgent Care with complaints of not having a menstrual cycle in 2 months and lower right quadrant cramping and burning with urination since 3 days ago. Patient reports there is a chance she could be pregnant, also adds that she usually has to have a pregnancy test via blood.

## 2018-07-07 NOTE — Discharge Instructions (Signed)
Drink plenty of water to empty bladder regularly. Avoid alcohol and caffeine as these may irritate the bladder.   Complete course of antibiotics.  I have sent your urine to be cultured to ensure antibiotic is correct choice.  Please follow up with PCP and/or ob for follow up if still no period in the next month.  Go to ER if develop worsening of pain, fevers, or vaginal bleeding.

## 2018-07-09 LAB — URINE CULTURE: Culture: >=100000 — AB

## 2018-07-19 ENCOUNTER — Inpatient Hospital Stay (HOSPITAL_COMMUNITY): Payer: Medicaid Other

## 2018-07-19 ENCOUNTER — Ambulatory Visit (INDEPENDENT_AMBULATORY_CARE_PROVIDER_SITE_OTHER): Payer: Medicaid Other

## 2018-07-19 ENCOUNTER — Encounter: Payer: Self-pay | Admitting: Family Medicine

## 2018-07-19 ENCOUNTER — Encounter (HOSPITAL_COMMUNITY): Payer: Self-pay | Admitting: Student

## 2018-07-19 ENCOUNTER — Inpatient Hospital Stay (HOSPITAL_COMMUNITY)
Admission: AD | Admit: 2018-07-19 | Discharge: 2018-07-19 | Disposition: A | Discharge status: Home / Self Care | Payer: Medicaid Other | Attending: Family Medicine | Admitting: Family Medicine

## 2018-07-19 ENCOUNTER — Other Ambulatory Visit: Payer: Self-pay

## 2018-07-19 DIAGNOSIS — O99331 Smoking (tobacco) complicating pregnancy, first trimester: Secondary | ICD-10-CM | POA: Insufficient documentation

## 2018-07-19 DIAGNOSIS — Z3A12 12 weeks gestation of pregnancy: Secondary | ICD-10-CM | POA: Insufficient documentation

## 2018-07-19 DIAGNOSIS — O9989 Other specified diseases and conditions complicating pregnancy, childbirth and the puerperium: Secondary | ICD-10-CM | POA: Diagnosis present

## 2018-07-19 DIAGNOSIS — Z3201 Encounter for pregnancy test, result positive: Secondary | ICD-10-CM | POA: Diagnosis present

## 2018-07-19 DIAGNOSIS — O26841 Uterine size-date discrepancy, first trimester: Secondary | ICD-10-CM | POA: Diagnosis not present

## 2018-07-19 DIAGNOSIS — Z833 Family history of diabetes mellitus: Secondary | ICD-10-CM | POA: Insufficient documentation

## 2018-07-19 DIAGNOSIS — O161 Unspecified maternal hypertension, first trimester: Secondary | ICD-10-CM | POA: Diagnosis not present

## 2018-07-19 DIAGNOSIS — Z8249 Family history of ischemic heart disease and other diseases of the circulatory system: Secondary | ICD-10-CM | POA: Diagnosis not present

## 2018-07-19 DIAGNOSIS — R109 Unspecified abdominal pain: Secondary | ICD-10-CM | POA: Insufficient documentation

## 2018-07-19 DIAGNOSIS — O26891 Other specified pregnancy related conditions, first trimester: Secondary | ICD-10-CM

## 2018-07-19 DIAGNOSIS — O26849 Uterine size-date discrepancy, unspecified trimester: Secondary | ICD-10-CM

## 2018-07-19 DIAGNOSIS — F17210 Nicotine dependence, cigarettes, uncomplicated: Secondary | ICD-10-CM | POA: Diagnosis not present

## 2018-07-19 DIAGNOSIS — Z79899 Other long term (current) drug therapy: Secondary | ICD-10-CM | POA: Diagnosis not present

## 2018-07-19 DIAGNOSIS — Z880 Allergy status to penicillin: Secondary | ICD-10-CM | POA: Diagnosis not present

## 2018-07-19 DIAGNOSIS — Z3491 Encounter for supervision of normal pregnancy, unspecified, first trimester: Secondary | ICD-10-CM

## 2018-07-19 LAB — CBC
HCT: 41.8 % (ref 36.0–46.0)
Hemoglobin: 14 g/dL (ref 12.0–15.0)
MCH: 30 pg (ref 26.0–34.0)
MCHC: 33.5 g/dL (ref 30.0–36.0)
MCV: 89.7 fL (ref 80.0–100.0)
Platelets: 433 10*3/uL — ABNORMAL HIGH (ref 150–400)
RBC: 4.66 MIL/uL (ref 3.87–5.11)
RDW: 14.2 % (ref 11.5–15.5)
WBC: 10.3 10*3/uL (ref 4.0–10.5)
nRBC: 0 % (ref 0.0–0.2)

## 2018-07-19 LAB — HCG, QUANTITATIVE, PREGNANCY: hCG, Beta Chain, Quant, S: 8770 m[IU]/mL — ABNORMAL HIGH (ref <5)

## 2018-07-19 LAB — WET PREP, GENITAL
Clue Cells Wet Prep HPF POC: NONE SEEN
Sperm: NONE SEEN
Trich, Wet Prep: NONE SEEN
Yeast Wet Prep HPF POC: NONE SEEN

## 2018-07-19 LAB — POCT PREGNANCY, URINE: Preg Test, Ur: POSITIVE — AB

## 2018-07-19 NOTE — MAU Provider Note (Signed)
Chief Complaint: Abdominal Pain   First Provider Initiated Contact with Patient 07/19/18 1457     SUBJECTIVE HPI: Amy Church is a 33 y.o. G4P1021 at [redacted]w[redacted]d unsure LMP who presents to Maternity Admissions reporting abdominal pain. Patient reports regular periods prior to February. Had a negative pregnancy test at urgent care 2 weeks ago.  Has had intermittent lower abdominal pain for the last 2 weeks. Had a positive pregnancy test in the office today & sent here for evaluation due to abdominal pain.  Denies fever/chills, n/v/d, constipation, dysuria, vaginal discharge, or vaginal bleeding.   Location: abdomen Quality: sharp Severity: 8/10 on pain scale Duration: 2 weeks Timing: intermittent Modifying factors: worse when she bends over Associated signs and symptoms: none  Past Medical History:  Diagnosis Date  . Chronic fungal otitis externa   . Hypertension   . Temporomandibular jaw dysfunction    OB History  Gravida Para Term Preterm AB Living  SAB TAB Ectopic Multiple Live Births    2     1    # Outcome Date GA Lbr Len/2nd Weight Sex Delivery Anes PTL Lv  4 Current           3 TAB 11/26/02             Birth Comments: System Generated. Please review and update pregnancy details.  2 TAB           1 Term     F Vag-Spont   LIV   Past Surgical History:  Procedure Laterality Date  . NO PAST SURGERIES     Social History   Socioeconomic History  . Marital status: Single    Spouse name: Not on file  . Number of children: Not on file  . Years of education: Not on file  . Highest education level: Not on file  Occupational History  . Not on file  Social Needs  . Financial resource strain: Not on file  . Food insecurity:    Worry: Not on file    Inability: Not on file  . Transportation needs:    Medical: Not on file    Non-medical: Not on file  Tobacco Use  . Smoking status: Light Tobacco Smoker    Packs/day: 0.50    Types: Cigarettes  . Smokeless  tobacco: Never Used  Substance and Sexual Activity  . Alcohol use: No  . Drug use: Not Currently    Types: Marijuana  . Sexual activity: Yes  Lifestyle  . Physical activity:    Days per week: Not on file    Minutes per session: Not on file  . Stress: Not on file  Relationships  . Social connections:    Talks on phone: Not on file    Gets together: Not on file    Attends religious service: Not on file    Active member of club or organization: Not on file    Attends meetings of clubs or organizations: Not on file    Relationship status: Not on file  . Intimate partner violence:    Fear of current or ex partner: Not on file    Emotionally abused: Not on file    Physically abused: Not on file    Forced sexual activity: Not on file  Other Topics Concern  . Not on file  Social History Narrative  . Not on file   Family History  Problem Relation Age of Onset  . Diabetes Paternal Grandmother   .  Hypertension Paternal Grandmother    No current facility-administered medications on file prior to encounter.    Current Outpatient Medications on File Prior to Encounter  Medication Sig Dispense Refill  . amLODipine (NORVASC) 5 MG tablet Take 5 mg by mouth daily.     Allergies  Allergen Reactions  . Penicillins Hives, Shortness Of Breath and Swelling    Has patient had a PCN reaction causing immediate rash, facial/tongue/throat swelling, SOB or lightheadedness with hypotension: yes Has patient had a PCN reaction causing severe rash involving mucus membranes or skin necrosis: no Has patient had a PCN reaction that required hospitalization yes Has patient had a PCN reaction occurring within the last 10 years: yes If all of the above answers are "NO", then may proceed with Cephalosporin use.     I have reviewed patient's Past Medical Hx, Surgical Hx, Family Hx, Social Hx, medications and allergies.   Review of Systems  Constitutional: Negative.   Gastrointestinal: Positive for  abdominal pain. Negative for constipation, diarrhea, nausea and vomiting.  Genitourinary: Negative.     OBJECTIVE Patient Vitals for the past 24 hrs:  BP Temp Pulse Resp SpO2  07/19/18 1651 126/82 - - - -  07/19/18 1447 126/78 98 F (36.7 C) 91 18 100 %   Constitutional: Well-developed, well-nourished female in no acute distress.  Cardiovascular: normal rate & rhythm, no murmur Respiratory: normal rate and effort. Lung sounds clear throughout GI: tender to deep palpation in RLQ. Abd soft, Pos BS x 4. No guarding or rebound tenderness MS: Extremities nontender, no edema, normal ROM Neurologic: Alert and oriented x 4.     LAB RESULTS Results for orders placed or performed during the hospital encounter of 07/19/18 (from the past 24 hour(s))  CBC     Status: Abnormal   Collection Time: 07/19/18  3:28 PM  Result Value Ref Range   WBC 10.3 4.0 - 10.5 K/uL   RBC 4.66 3.87 - 5.11 MIL/uL   Hemoglobin 14.0 12.0 - 15.0 g/dL   HCT 41.3 24.4 - 01.0 %   MCV 89.7 80.0 - 100.0 fL   MCH 30.0 26.0 - 34.0 pg   MCHC 33.5 30.0 - 36.0 g/dL   RDW 27.2 53.6 - 64.4 %   Platelets 433 (H) 150 - 400 K/uL   nRBC 0.0 0.0 - 0.2 %  hCG, quantitative, pregnancy     Status: Abnormal   Collection Time: 07/19/18  3:28 PM  Result Value Ref Range   hCG, Beta Chain, Quant, S 8,770 (H) <5 mIU/mL  Wet prep, genital     Status: Abnormal   Collection Time: 07/19/18  3:40 PM  Result Value Ref Range   Yeast Wet Prep HPF POC NONE SEEN NONE SEEN   Trich, Wet Prep NONE SEEN NONE SEEN   Clue Cells Wet Prep HPF POC NONE SEEN NONE SEEN   WBC, Wet Prep HPF POC MODERATE (A) NONE SEEN   Sperm NONE SEEN     IMAGING US Ob Less Than 14 Weeks With Ob Transvaginal  Result Date: 07/19/2018 CLINICAL DATA:  Pelvic pain EXAM: OBSTETRIC <14 WK [place] AND TRANSVAGINAL OB US TECHNIQUE: Both transabdominal and transvaginal ultrasound examinations were performed for complete evaluation of the gestation as well as the maternal uterus,  adnexal regions, and pelvic cul-de-sac. Transvaginal technique was performed to assess early pregnancy. COMPARISON:  None. FINDINGS: Intrauterine gestational sac: Visualized Yolk sac:  Visualized Embryo:  Not visualized Cardiac Activity: Not visualized MSD: 8 mm   5 w  4 d Subchorionic hemorrhage:  None visualized. Maternal uterus/adnexae: Cervical os is closed. Left ovary measures 2.4 x 1.4 x 1.2 cm. Right ovary measures 3.8 x 3.1 by 2.6 cm. There is a probable hemorrhagic corpus luteum on the right measuring approximately 1.5 cm. No free pelvic fluid. IMPRESSION: There is a gestational sac containing a yolk sac. Fetal pole not seen currently. Advise follow-up study in approximately 14 days to assess for fetal pole and fetal heart activity. No subchorionic hemorrhage evident. Probable hemorrhagic corpus luteum on the right. No maternal free pelvic fluid. Electronically Signed   By: Bretta BangWilliam  Woodruff III M.D.   On: 07/19/2018 16:22    MAU COURSE Orders Placed This Encounter  Procedures  . Wet prep, genital  . US OB LESS THAN 14 WEEKS WITH OB TRANSVAGINAL  . US OB Transvaginal  . CBC  . hCG, quantitative, pregnancy  . Discharge patient   No orders of the defined types were placed in this encounter.   MDM +UPT UA, wet prep, GC/chlamydia, CBC, ABO/Rh, quant hCG, and US today to rule out ectopic pregnancy  Abdomen soft with some tenderness to deep palpation in RLQ. No rebound or guarding. Pt afebrile.   Ultrasound shows IUGS with yolk sac, ~4546w4d. Discussed results with patient. Pt is adamant that she hasn't had intercourse since mid March. Possible failed pregnancy if she hasn't had intercourse in 2 months. Will get outpatient ultrasound in 10 days for viability.   ASSESSMENT 1. Normal IUP (intrauterine pregnancy) on prenatal ultrasound, first trimester   2. Abdominal pain during pregnancy in first trimester   3. Fetal size inconsistent with dates     PLAN Discharge home in stable  condition. SAB precautions Follow-up Information    [facility].   Specialty:  Radiology Why:  Call to schedule your follow up ultrasound Contact information: [address], [address] [id number] [place] [id number] [phone]         Allergies as of 07/19/2018      Reactions   Penicillins Hives, Shortness Of Breath, Swelling   Has patient had a PCN reaction causing immediate rash, facial/tongue/throat swelling, SOB or lightheadedness with hypotension: yes Has patient had a PCN reaction causing severe rash involving mucus membranes or skin necrosis: no Has patient had a PCN reaction that required hospitalization yes Has patient had a PCN reaction occurring within the last 10 years: yes If all of the above answers are "NO", then may proceed with Cephalosporin use.      Medication List    TAKE these medications   amLODipine 5 MG tablet Commonly known as:  NORVASC Take 5 mg by mouth daily.        Judeth HornLawrence, Keyandra Swenson, NP 07/19/2018  4:59 PM

## 2018-07-19 NOTE — MAU Note (Signed)
Amy Church is a 33 y.o. at [redacted]w[redacted]d here in MAU reporting: pain in her right lower abdomen. Pt states that she had a negative test with her daughter 16 years ago and did not find out she was pregnant until she was 6 months. States she had been in a relationship with a female until a year ago and had intercourse last in March. LMP: 04/25/18 Onset of complaint: 2 weeks ago Pain score:8 Vitals:   07/19/18 1447  BP: 126/78  Pulse: 91  Resp: 18  Temp: 98 F (36.7 C)  SpO2: 100%      Lab orders placed from triage: UA

## 2018-07-19 NOTE — Discharge Instructions (Signed)
Return to care   If you have heavier bleeding that soaks through more that 2 pads per hour for an hour or more  If you bleed so much that you feel like you might pass out or you do pass out  If you have significant abdominal pain that is not improved with Tylenol      First Trimester of Pregnancy  The first trimester of pregnancy is from week 1 until the end of week 13 (months 1 through 3). During this time, your baby will begin to develop inside you. At 6-8 weeks, the eyes and face are formed, and the heartbeat can be seen on ultrasound. At the end of 12 weeks, all the baby's organs are formed. Prenatal care is all the medical care you receive before the birth of your baby. Make sure you get good prenatal care and follow all of your doctor's instructions. Follow these instructions at home: Medicines  Take over-the-counter and prescription medicines only as told by your doctor. Some medicines are safe and some medicines are not safe during pregnancy.  Take a prenatal vitamin that contains at least 600 micrograms (mcg) of folic acid.  If you have trouble pooping (constipation), take medicine that will make your stool soft (stool softener) if your doctor approves. Eating and drinking   Eat regular, healthy meals.  Your doctor will tell you the amount of weight gain that is right for you.  Avoid raw meat and uncooked cheese.  If you feel sick to your stomach (nauseous) or throw up (vomit): ? Eat 4 or 5 small meals a day instead of 3 large meals. ? Try eating a few soda crackers. ? Drink liquids between meals instead of during meals.  To prevent constipation: ? Eat foods that are high in fiber, like fresh fruits and vegetables, whole grains, and beans. ? Drink enough fluids to keep your pee (urine) clear or pale yellow. Activity  Exercise only as told by your doctor. Stop exercising if you have cramps or pain in your lower belly (abdomen) or low back.  Do not exercise if it is  too hot, too humid, or if you are in a place of great height (high altitude).  Try to avoid standing for long periods of time. Move your legs often if you must stand in one place for a long time.  Avoid heavy lifting.  Wear low-heeled shoes. Sit and stand up straight.  You can have sex unless your doctor tells you not to. Relieving pain and discomfort  Wear a good support bra if your breasts are sore.  Take warm water baths (sitz baths) to soothe pain or discomfort caused by hemorrhoids. Use hemorrhoid cream if your doctor says it is okay.  Rest with your legs raised if you have leg cramps or low back pain.  If you have puffy, bulging veins (varicose veins) in your legs: ? Wear support hose or compression stockings as told by your doctor. ? Raise (elevate) your feet for 15 minutes, 3-4 times a day. ? Limit salt in your food. Prenatal care  Schedule your prenatal visits by the twelfth week of pregnancy.  Write down your questions. Take them to your prenatal visits.  Keep all your prenatal visits as told by your doctor. This is important. Safety  Wear your seat belt at all times when driving.  Make a list of emergency phone numbers. The list should include numbers for family, friends, the hospital, and police and fire departments. General instructions  Ask your doctor for a referral to a local prenatal class. Begin classes no later than at the start of month 6 of your pregnancy.  Ask for help if you need counseling or if you need help with nutrition. Your doctor can give you advice or tell you where to go for help.  Do not use hot tubs, steam rooms, or saunas.  Do not douche or use tampons or scented sanitary pads.  Do not cross your legs for long periods of time.  Avoid all herbs and alcohol. Avoid drugs that are not approved by your doctor.  Do not use any tobacco products, including cigarettes, chewing tobacco, and electronic cigarettes. If you need help quitting, ask  your doctor. You may get counseling or other support to help you quit.  Avoid cat litter boxes and soil used by cats. These carry germs that can cause birth defects in the baby and can cause a loss of your baby (miscarriage) or stillbirth.  Visit your dentist. At home, brush your teeth with a soft toothbrush. Be gentle when you floss. Contact a doctor if:  You are dizzy.  You have mild cramps or pressure in your lower belly.  You have a nagging pain in your belly area.  You continue to feel sick to your stomach, you throw up, or you have watery poop (diarrhea).  You have a bad smelling fluid coming from your vagina.  You have pain when you pee (urinate).  You have increased puffiness (swelling) in your face, hands, legs, or ankles. Get help right away if:  You have a fever.  You are leaking fluid from your vagina.  You have spotting or bleeding from your vagina.  You have very bad belly cramping or pain.  You gain or lose weight rapidly.  You throw up blood. It may look like coffee grounds.  You are around people who have [place] measles, fifth disease, or chickenpox.  You have a very bad headache.  You have shortness of breath.  You have any kind of trauma, such as from a fall or a car accident. Summary  The first trimester of pregnancy is from week 1 until the end of week 13 (months 1 through 3).  To take care of yourself and your unborn baby, you will need to eat healthy meals, take medicines only if your doctor tells you to do so, and do activities that are safe for you and your baby.  Keep all follow-up visits as told by your doctor. This is important as your doctor will have to ensure that your baby is healthy and growing well. This information is not intended to replace advice given to you by your health care provider. Make sure you discuss any questions you have with your health care provider. Document Released: 08/04/2007 Document Revised: 02/24/2016 Document  Reviewed: 02/24/2016 Elsevier Interactive Patient Education  2019 [employer].

## 2018-07-19 NOTE — Progress Notes (Signed)
Pt here today for pregnancy test.  Resulted positive.  Pt reports that her LMP sometime in February.  Pt also reports that she has been testing at home from February to now and tested negative until this week she tested positive.  Per chart review pt went to ED on 07/07/18 and tested negative at that time as well.   Pt denies any bleeding however has been having RLQ pain that has been constant for the past two weeks.  Notified Dr. Marice Potter who recommends pt to go to MAU for evaluation of possible ectopic pregnancy.  Notified pt provider's recommendation.  Pt stated that she will go straight to MAU after leaving here.  Notified MAU the arrival of pt.    Addison Naegeli, RN, BSN 07/19/18

## 2018-07-20 LAB — GC/CHLAMYDIA PROBE AMP (~~LOC~~) NOT AT ARMC
Chlamydia: NEGATIVE
Neisseria Gonorrhea: NEGATIVE

## 2018-07-21 NOTE — Progress Notes (Signed)
I have reviewed this chart and agree with the RN/CMA assessment and management.    Amanii Snethen C Wanell Lorenzi, MD, FACOG Attending Physician, [facility]  

## 2018-07-27 ENCOUNTER — Ambulatory Visit (HOSPITAL_COMMUNITY): Payer: Medicaid Other

## 2018-07-31 ENCOUNTER — Ambulatory Visit (INDEPENDENT_AMBULATORY_CARE_PROVIDER_SITE_OTHER): Payer: Medicaid Other | Admitting: General Practice

## 2018-07-31 ENCOUNTER — Ambulatory Visit (HOSPITAL_COMMUNITY)
Admission: RE | Admit: 2018-07-31 | Discharge: 2018-07-31 | Disposition: A | Discharge status: Home / Self Care | Payer: Medicaid Other | Source: Ambulatory Visit | Attending: Student | Admitting: Student

## 2018-07-31 ENCOUNTER — Other Ambulatory Visit: Payer: Self-pay

## 2018-07-31 DIAGNOSIS — R109 Unspecified abdominal pain: Secondary | ICD-10-CM | POA: Insufficient documentation

## 2018-07-31 DIAGNOSIS — O26891 Other specified pregnancy related conditions, first trimester: Secondary | ICD-10-CM | POA: Insufficient documentation

## 2018-07-31 DIAGNOSIS — O26849 Uterine size-date discrepancy, unspecified trimester: Secondary | ICD-10-CM | POA: Insufficient documentation

## 2018-07-31 DIAGNOSIS — Z3A01 Less than 8 weeks gestation of pregnancy: Secondary | ICD-10-CM

## 2018-07-31 DIAGNOSIS — Z712 Person consulting for explanation of examination or test findings: Secondary | ICD-10-CM

## 2018-07-31 MED ORDER — PRENATAL PLUS 27-1 MG PO TABS: 1.0000 | ORAL_TABLET | Freq: Every day | ORAL | 11 refills | Status: DC

## 2018-07-31 NOTE — Progress Notes (Signed)
Patient presents to office today for viability ultrasound results. Reviewed with Dr Vergie Living who finds single living IUP- patient should begin prenatal care.   Informed patient of results, reviewed dating, & provided pictures. Patient verbalized understanding and would like Rx for PNV. Rx sent in per protocol. Patient would like to initiate care here- hx of HTN so will be high risk. Patient is currently taking HCTZ- states MAU provider was aware as it is in her chart. Patient had no questions and will return for new OB intake & new OB appt.   Chase Caller RN BSN 07/31/18

## 2018-08-04 ENCOUNTER — Telehealth: Payer: Self-pay | Admitting: Obstetrics & Gynecology

## 2018-08-04 NOTE — Telephone Encounter (Signed)
Called the patient to confirm the appointment. Informed of how to download mychart and also sent a link by the mobile number. Informed in detail of how to view the mychart visit link. The patient stated she understood and will download the app prior to the appointment.

## 2018-08-04 NOTE — Progress Notes (Signed)
I have reviewed the chart and agree with nursing staff's documentation of this patient's encounter.  Cortland Bing, MD 08/04/2018 2:28 PM

## 2018-08-07 ENCOUNTER — Encounter: Payer: Self-pay | Admitting: *Deleted

## 2018-08-07 ENCOUNTER — Other Ambulatory Visit: Payer: Self-pay

## 2018-08-07 ENCOUNTER — Telehealth (INDEPENDENT_AMBULATORY_CARE_PROVIDER_SITE_OTHER): Payer: Medicaid Other | Admitting: *Deleted

## 2018-08-07 DIAGNOSIS — I10 Essential (primary) hypertension: Secondary | ICD-10-CM

## 2018-08-07 DIAGNOSIS — O0990 Supervision of high risk pregnancy, unspecified, unspecified trimester: Secondary | ICD-10-CM

## 2018-08-07 DIAGNOSIS — O10919 Unspecified pre-existing hypertension complicating pregnancy, unspecified trimester: Secondary | ICD-10-CM | POA: Insufficient documentation

## 2018-08-07 HISTORY — DX: Essential (primary) hypertension: I10

## 2018-08-07 MED ORDER — AMBULATORY NON FORMULARY MEDICATION: 0 refills | Status: DC

## 2018-08-07 NOTE — Progress Notes (Signed)
I connected with  Amy Church on 08/07/18 at  1:15 PM EDT by telephone and verified that I am speaking with the correct person using two identifiers.   I discussed the limitations, risks, security and privacy concerns of performing an evaluation and management service by telephone and the availability of in person appointments. I also discussed with the patient that there may be a patient responsible charge related to this service. The patient expressed understanding and agreed to proceed.  New Ob intake completed. Pt reports having problem with frequent nausea and vomiting - recommended OTC Unisom. Pt also states desire to quit smoking and having difficulty with this. She was advised to discuss with provider @ office visit on 6/22. Pt's pregnancy history is significant for previous child in NICU x1 month. She states that the baby had elevated WBC and recommendation was for baby to have blood transfusion. Prayers were said over the baby by clergy and baby got better - no transfusion done. Pt informed that her prenatal care will consist of a combination of virtual visits as well as face to face visits in office. She was advised of Covid visiting restrictions. Pt agrees to check BP once per week @ home and to complete registration for [employer] in order to record BP readings in the App. Pt voiced understanding of all information and instructions given.  Mcarthur Ivins, Ronnell Freshwater, RN 08/07/2018  3:24 PM

## 2018-08-19 ENCOUNTER — Ambulatory Visit (HOSPITAL_COMMUNITY)
Admission: EM | Admit: 2018-08-19 | Discharge: 2018-08-19 | Disposition: A | Discharge status: Home / Self Care | Payer: Medicaid Other

## 2018-08-19 ENCOUNTER — Other Ambulatory Visit: Payer: Self-pay

## 2018-08-19 NOTE — ED Notes (Signed)
Patient reports mopping last night, slipped and hit lower abdomen on a recliner.  Since then has had soreness in lower abdomen, particularly with sitting and standing from a seated position.    Dr Lanny Cramp discussed situation with patient

## 2018-08-21 ENCOUNTER — Other Ambulatory Visit: Payer: Self-pay

## 2018-08-21 ENCOUNTER — Other Ambulatory Visit (HOSPITAL_COMMUNITY)
Admission: RE | Admit: 2018-08-21 | Discharge: 2018-08-21 | Disposition: A | Discharge status: Home / Self Care | Payer: Medicaid Other | Source: Ambulatory Visit | Attending: Family Medicine | Admitting: Family Medicine

## 2018-08-21 ENCOUNTER — Encounter: Payer: Self-pay | Admitting: Family Medicine

## 2018-08-21 ENCOUNTER — Ambulatory Visit (INDEPENDENT_AMBULATORY_CARE_PROVIDER_SITE_OTHER): Payer: Medicaid Other | Admitting: Family Medicine

## 2018-08-21 VITALS — BP 115/60 | HR 98 | Temp 98.4°F | Wt 285.3 lb

## 2018-08-21 DIAGNOSIS — O0991 Supervision of high risk pregnancy, unspecified, first trimester: Secondary | ICD-10-CM | POA: Diagnosis not present

## 2018-08-21 DIAGNOSIS — O10919 Unspecified pre-existing hypertension complicating pregnancy, unspecified trimester: Secondary | ICD-10-CM

## 2018-08-21 DIAGNOSIS — O26891 Other specified pregnancy related conditions, first trimester: Secondary | ICD-10-CM

## 2018-08-21 DIAGNOSIS — N898 Other specified noninflammatory disorders of vagina: Secondary | ICD-10-CM | POA: Diagnosis not present

## 2018-08-21 DIAGNOSIS — O0990 Supervision of high risk pregnancy, unspecified, unspecified trimester: Secondary | ICD-10-CM | POA: Diagnosis not present

## 2018-08-21 DIAGNOSIS — O10911 Unspecified pre-existing hypertension complicating pregnancy, first trimester: Secondary | ICD-10-CM | POA: Diagnosis not present

## 2018-08-21 DIAGNOSIS — Z3A10 10 weeks gestation of pregnancy: Secondary | ICD-10-CM

## 2018-08-21 MED ORDER — DOXYLAMINE-PYRIDOXINE 10-10 MG PO TBEC: 2.0000 | DELAYED_RELEASE_TABLET | Freq: Every day | ORAL | 5 refills | Status: DC

## 2018-08-21 NOTE — Progress Notes (Signed)
Subjective:  Amy Church is a [id number] 75w1dbeing seen today for her first obstetrical visit.  Her obstetrical history is significant for chronic hypertension in pregnancy. Unplanned, but desired pregnancy. FOB involved. Patient does intend to breast feed. Pregnancy history fully reviewed.  Patient reports nausea and vomiting.  BP 115/60   Pulse 98   Temp 98.4 F (36.9 C)   Wt 285 lb 4.8 oz (129.4 kg)   LMP 04/25/2018 (Approximate)   BMI 46.05 kg/m   HISTORY: OB History  Gravida Para Term Preterm AB Living  4 1 1   2 1   SAB TAB Ectopic Multiple Live Births    2     1    # Outcome Date GA Lbr Len/2nd Weight Sex Delivery Anes PTL Lv  4 Current           3 TAB 11/26/02             Birth Comments: System Generated. Please review and update pregnancy details.  2 Term 11/26/02 447w0d6 lb 8 oz (2.948 kg) F Vag-Spont   LIV  1 TAB             Past Medical History:  Diagnosis Date  . Chronic fungal otitis externa   . Hypertension    Did not have during previous pregnancy  . Temporomandibular jaw dysfunction     Past Surgical History:  Procedure Laterality Date  . NO PAST SURGERIES      Family History  Problem Relation Age of Onset  . Heart disease Paternal Grandmother   . Hypertension Paternal Grandmother   . Diabetes Paternal Grandmother   . Cancer Mother        pelvic     Exam  BP 115/60   Pulse 98   Temp 98.4 F (36.9 C)   Wt 285 lb 4.8 oz (129.4 kg)   LMP 04/25/2018 (Approximate)   BMI 46.05 kg/m   CONSTITUTIONAL: Well-developed, well-nourished female in no acute distress.  HENT:  Normocephalic, atraumatic, External right and left ear normal. Oropharynx is clear and moist EYES: Conjunctivae and EOM are normal. Pupils are equal, round, and reactive to light. No scleral icterus.  NECK: Normal range of motion, supple, no masses.  Normal thyroid.  CARDIOVASCULAR: Normal heart rate noted, regular rhythm RESPIRATORY: Clear to auscultation bilaterally.  Effort and breath sounds normal, no problems with respiration noted. BREASTS: Symmetric in size. No masses, skin changes, nipple drainage, or lymphadenopathy. ABDOMEN: Soft, normal bowel sounds, no distention noted.  No tenderness, rebound or guarding.  PELVIC: Normal appearing external genitalia; normal appearing vaginal mucosa and cervix. No abnormal discharge noted. Normal uterine size, no other palpable masses, no uterine or adnexal tenderness. MUSCULOSKELETAL: Normal range of motion. No tenderness.  No cyanosis, clubbing, or edema.  2+ distal pulses. SKIN: Skin is warm and dry. No rash noted. Not diaphoretic. No erythema. No pallor. NEUROLOGIC: Alert and oriented to person, place, and time. Normal reflexes, muscle tone coordination. No cranial nerve deficit noted. PSYCHIATRIC: Normal mood and affect. Normal behavior. Normal judgment and thought content.    Assessment:    Pregnancy: [id number] Active Problem List   Diagnosis Date Noted  . Supervision of high risk pregnancy, antepartum 08/07/2018  . Chronic hypertension in pregnancy 08/07/2018  . Pelvic pain 11/08/2016      Plan:   1. Supervision of high risk pregnancy, antepartum FHT and FH normal.  Panorama Discussed practice setup.  - Obstetric Panel, Including HIV - [place] OB  DETAIL +14 WK; Future - Cervicovaginal ancillary only( [facility]) - Comp Met (CMET) - Protein / creatinine ratio, urine  2. Chronic hypertension in pregnancy ASA 59m as 12 weeks CMP, CBC Stop amlodipine.  - Obstetric Panel, Including HIV - [place] OB DETAIL +14 WK; Future - Cervicovaginal ancillary only( [facility]) - Comp Met (CMET) - Protein / creatinine ratio, urine  3. Vaginal discharge in pregnancy in first trimester Wet prep.     Problem list reviewed and updated. 75% of 30 min visit spent on counseling and coordination of care.     JTruett Mainland6/22/2020

## 2018-08-21 NOTE — Addendum Note (Signed)
Addended by: Dolores Hoose on: 08/21/2018 02:34 PM   Modules accepted: Orders

## 2018-08-21 NOTE — Progress Notes (Signed)
Ultrasound scheduled for Aug 24 @ 1300.

## 2018-08-21 NOTE — Addendum Note (Signed)
Addended by: Dolores Hoose on: 08/21/2018 02:51 PM   Modules accepted: Orders

## 2018-08-21 NOTE — Progress Notes (Signed)
Pt states she has received her blood pressure cuff.  Pt signed up for babyscripts and downloaded app in the office.   Pt states she has discharge that is clear/white and thick with an odor for 2 weeks.   Pt states she fell on Thursday 6//18/2020 and hit her lower belly.  Pt states that she went to urgent care and they told her to keep her appointment here.

## 2018-08-22 ENCOUNTER — Encounter: Payer: Self-pay | Admitting: *Deleted

## 2018-08-22 LAB — CERVICOVAGINAL ANCILLARY ONLY
Bacterial vaginitis: POSITIVE — AB
Candida vaginitis: POSITIVE — AB
Chlamydia: NEGATIVE
Neisseria Gonorrhea: NEGATIVE
Trichomonas: NEGATIVE

## 2018-08-22 LAB — OBSTETRIC PANEL, INCLUDING HIV
Antibody Screen: NEGATIVE
Basophils Absolute: 0 10*3/uL (ref 0.0–0.2)
Basos: 0 %
EOS (ABSOLUTE): 0.2 10*3/uL (ref 0.0–0.4)
Eos: 2 %
HIV Screen 4th Generation wRfx: NONREACTIVE
Hematocrit: 42.2 % (ref 34.0–46.6)
Hemoglobin: 14.1 g/dL (ref 11.1–15.9)
Hepatitis B Surface Ag: NEGATIVE
Immature Grans (Abs): 0 10*3/uL (ref 0.0–0.1)
Immature Granulocytes: 0 %
Lymphocytes Absolute: 2.8 10*3/uL (ref 0.7–3.1)
Lymphs: 33 %
MCH: 30.5 pg (ref 26.6–33.0)
MCHC: 33.4 g/dL (ref 31.5–35.7)
MCV: 91 fL (ref 79–97)
Monocytes Absolute: 0.6 10*3/uL (ref 0.1–0.9)
Monocytes: 7 %
Neutrophils Absolute: 5.1 10*3/uL (ref 1.4–7.0)
Neutrophils: 58 %
Platelets: 436 10*3/uL (ref 150–450)
RBC: 4.63 x10E6/uL (ref 3.77–5.28)
RDW: 14.6 % (ref 11.7–15.4)
RPR Ser Ql: NONREACTIVE
Rh Factor: POSITIVE
Rubella Antibodies, IGG: 2.93 index (ref >0.99)
WBC: 8.7 10*3/uL (ref 3.4–10.8)

## 2018-08-22 LAB — COMPREHENSIVE METABOLIC PANEL
ALT: 14 IU/L (ref 0–32)
AST: 13 IU/L (ref 0–40)
Albumin/Globulin Ratio: 1.5 (ref 1.2–2.2)
Albumin: 4 g/dL (ref 3.8–4.8)
Alkaline Phosphatase: 41 IU/L (ref 39–117)
BUN/Creatinine Ratio: 10 (ref 9–23)
BUN: 6 mg/dL (ref 6–20)
Bilirubin Total: 0.2 mg/dL (ref 0.0–1.2)
CO2: 21 mmol/L (ref 20–29)
Calcium: 9.5 mg/dL (ref 8.7–10.2)
Chloride: 100 mmol/L (ref 96–106)
Creatinine, Ser: 0.61 mg/dL (ref 0.57–1.00)
GFR calc Af Amer: 138 mL/min/{1.73_m2} (ref >59)
GFR calc non Af Amer: 119 mL/min/{1.73_m2} (ref >59)
Globulin, Total: 2.7 g/dL (ref 1.5–4.5)
Glucose: 130 mg/dL — ABNORMAL HIGH (ref 65–99)
Potassium: 3.6 mmol/L (ref 3.5–5.2)
Sodium: 135 mmol/L (ref 134–144)
Total Protein: 6.7 g/dL (ref 6.0–8.5)

## 2018-08-22 LAB — PROTEIN / CREATININE RATIO, URINE
Creatinine, Urine: 326.5 mg/dL
Protein, Ur: 45 mg/dL
Protein/Creat Ratio: 138 mg/g creat (ref 0–200)

## 2018-08-24 ENCOUNTER — Other Ambulatory Visit: Payer: Self-pay | Admitting: Family Medicine

## 2018-08-24 MED ORDER — MICONAZOLE NITRATE 2 % VA CREA: 1.0000 | TOPICAL_CREAM | Freq: Every day | VAGINAL | 2 refills | Status: DC

## 2018-08-24 MED ORDER — METRONIDAZOLE 500 MG PO TABS: 500.0000 mg | ORAL_TABLET | Freq: Two times a day (BID) | ORAL | 0 refills | Status: DC

## 2018-08-25 ENCOUNTER — Telehealth: Payer: Self-pay | Admitting: Family Medicine

## 2018-08-25 NOTE — Telephone Encounter (Signed)
Patient was sleep, and asked if she could be called on Monday. She just got off work, and not thinking straight.

## 2018-08-25 NOTE — Telephone Encounter (Signed)
-----   Message from Truett Mainland, DO sent at 08/24/2018  1:47 PM EDT ----- Blood sugar high. Needs to come in for fasting 2hr GTT.

## 2018-08-25 NOTE — Telephone Encounter (Signed)
Called patient to get her scheduled for 2 hr gtt. She stated she just got home form work, and would rather I call her back on Monday. She's sleep and can't think straight.

## 2018-08-26 LAB — URINE CULTURE, OB REFLEX

## 2018-08-26 LAB — CULTURE, OB URINE

## 2018-08-28 ENCOUNTER — Encounter: Payer: Self-pay | Admitting: Family Medicine

## 2018-08-28 DIAGNOSIS — R8271 Bacteriuria: Secondary | ICD-10-CM | POA: Insufficient documentation

## 2018-08-29 ENCOUNTER — Other Ambulatory Visit: Payer: Self-pay | Admitting: Family Medicine

## 2018-08-29 DIAGNOSIS — Z880 Allergy status to penicillin: Secondary | ICD-10-CM

## 2018-08-31 NOTE — Addendum Note (Signed)
Addended by: Truett Mainland on: 08/31/2018 02:21 PM   Modules accepted: Orders

## 2018-09-05 ENCOUNTER — Encounter: Payer: Self-pay | Admitting: *Deleted

## 2018-09-05 ENCOUNTER — Other Ambulatory Visit: Payer: Self-pay | Admitting: *Deleted

## 2018-09-05 NOTE — Progress Notes (Signed)
Opened in error

## 2018-09-18 ENCOUNTER — Other Ambulatory Visit: Payer: Self-pay

## 2018-09-18 ENCOUNTER — Telehealth: Payer: Medicaid Other | Admitting: Obstetrics & Gynecology

## 2018-09-18 DIAGNOSIS — O0990 Supervision of high risk pregnancy, unspecified, unspecified trimester: Secondary | ICD-10-CM

## 2018-09-18 DIAGNOSIS — R8271 Bacteriuria: Secondary | ICD-10-CM

## 2018-09-18 DIAGNOSIS — O10919 Unspecified pre-existing hypertension complicating pregnancy, unspecified trimester: Secondary | ICD-10-CM

## 2018-09-18 NOTE — Progress Notes (Signed)
Called pt and she stated that she was in the middle of finding somewhere to stay, and needed to reschedule. Rescheduled pt for Wednesday.

## 2018-09-18 NOTE — Progress Notes (Signed)
Pt rescheduled. NO visit today.  clh-S

## 2018-09-20 ENCOUNTER — Telehealth: Payer: Medicaid Other | Admitting: Obstetrics & Gynecology

## 2018-09-20 ENCOUNTER — Encounter: Payer: Self-pay | Admitting: Obstetrics & Gynecology

## 2018-09-20 ENCOUNTER — Other Ambulatory Visit: Payer: Self-pay

## 2018-09-20 DIAGNOSIS — O0990 Supervision of high risk pregnancy, unspecified, unspecified trimester: Secondary | ICD-10-CM

## 2018-09-20 NOTE — Progress Notes (Signed)
Patient rescheduled appointment.  Verita Schneiders, MD
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
## 2018-09-21 ENCOUNTER — Emergency Department (HOSPITAL_COMMUNITY)
Admission: EM | Admit: 2018-09-21 | Discharge: 2018-09-21 | Disposition: A | Discharge status: Home / Self Care | Payer: No Typology Code available for payment source | Attending: Emergency Medicine | Admitting: Emergency Medicine

## 2018-09-21 ENCOUNTER — Encounter (HOSPITAL_COMMUNITY): Payer: Self-pay

## 2018-09-21 ENCOUNTER — Emergency Department (HOSPITAL_COMMUNITY): Payer: No Typology Code available for payment source

## 2018-09-21 ENCOUNTER — Other Ambulatory Visit: Payer: Self-pay

## 2018-09-21 DIAGNOSIS — Z87891 Personal history of nicotine dependence: Secondary | ICD-10-CM | POA: Diagnosis not present

## 2018-09-21 DIAGNOSIS — Z880 Allergy status to penicillin: Secondary | ICD-10-CM | POA: Insufficient documentation

## 2018-09-21 DIAGNOSIS — Z3A14 14 weeks gestation of pregnancy: Secondary | ICD-10-CM | POA: Insufficient documentation

## 2018-09-21 DIAGNOSIS — O10011 Pre-existing essential hypertension complicating pregnancy, first trimester: Secondary | ICD-10-CM | POA: Diagnosis not present

## 2018-09-21 DIAGNOSIS — Z7982 Long term (current) use of aspirin: Secondary | ICD-10-CM | POA: Diagnosis not present

## 2018-09-21 DIAGNOSIS — M542 Cervicalgia: Secondary | ICD-10-CM | POA: Insufficient documentation

## 2018-09-21 DIAGNOSIS — O26891 Other specified pregnancy related conditions, first trimester: Secondary | ICD-10-CM | POA: Diagnosis not present

## 2018-09-21 DIAGNOSIS — R1031 Right lower quadrant pain: Secondary | ICD-10-CM | POA: Diagnosis present

## 2018-09-21 DIAGNOSIS — Z79899 Other long term (current) drug therapy: Secondary | ICD-10-CM | POA: Insufficient documentation

## 2018-09-21 LAB — URINALYSIS, ROUTINE W REFLEX MICROSCOPIC
Bilirubin Urine: NEGATIVE
Glucose, UA: NEGATIVE mg/dL
Hgb urine dipstick: NEGATIVE
Ketones, ur: NEGATIVE mg/dL
Nitrite: NEGATIVE
Protein, ur: NEGATIVE mg/dL
Specific Gravity, Urine: 1.025 (ref 1.005–1.030)
pH: 5 (ref 5.0–8.0)

## 2018-09-21 MED ORDER — ACETAMINOPHEN 500 MG PO TABS
1000.0000 mg | ORAL_TABLET | Freq: Once | ORAL | Status: AC
  Administered 2018-09-21: 09:00:00 1000 mg via ORAL
  Filled 2018-09-21: qty 2

## 2018-09-21 MED ORDER — NITROFURANTOIN MONOHYD MACRO 100 MG PO CAPS: 100.0000 mg | ORAL_CAPSULE | Freq: Two times a day (BID) | ORAL | 0 refills | Status: DC

## 2018-09-21 MED ORDER — DICLOFENAC SODIUM 1 % TD GEL: 2.0000 g | Freq: Four times a day (QID) | TRANSDERMAL | 0 refills | Status: DC
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
## 2018-09-21 NOTE — ED Notes (Signed)
Attempted to get fetal heart tones, but was unsuccessful. PA Claudia made aware. US OB at bedside.
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
## 2018-09-26 ENCOUNTER — Telehealth (INDEPENDENT_AMBULATORY_CARE_PROVIDER_SITE_OTHER): Payer: Medicaid Other | Admitting: Family Medicine

## 2018-09-26 ENCOUNTER — Other Ambulatory Visit: Payer: Self-pay

## 2018-09-26 DIAGNOSIS — O4442 Low lying placenta NOS or without hemorrhage, second trimester: Secondary | ICD-10-CM

## 2018-09-26 DIAGNOSIS — O10912 Unspecified pre-existing hypertension complicating pregnancy, second trimester: Secondary | ICD-10-CM

## 2018-09-26 DIAGNOSIS — O0992 Supervision of high risk pregnancy, unspecified, second trimester: Secondary | ICD-10-CM | POA: Diagnosis not present

## 2018-09-26 DIAGNOSIS — O4440 Low lying placenta NOS or without hemorrhage, unspecified trimester: Secondary | ICD-10-CM | POA: Insufficient documentation

## 2018-09-26 DIAGNOSIS — Z3A15 15 weeks gestation of pregnancy: Secondary | ICD-10-CM

## 2018-09-26 DIAGNOSIS — O0990 Supervision of high risk pregnancy, unspecified, unspecified trimester: Secondary | ICD-10-CM

## 2018-09-26 DIAGNOSIS — O10919 Unspecified pre-existing hypertension complicating pregnancy, unspecified trimester: Secondary | ICD-10-CM

## 2018-09-26 DIAGNOSIS — R8271 Bacteriuria: Secondary | ICD-10-CM

## 2018-09-26 LAB — OB RESULTS CONSOLE GBS: GBS: POSITIVE

## 2018-09-26 NOTE — Progress Notes (Signed)
TELEHEALTH VIRTUAL OBSTETRICS VISIT ENCOUNTER NOTE I connected with Amy Church on 09/26/18 at 10:15 AM EDT by telephone at home and verified that I am speaking with the correct person using two identifiers.   I discussed the limitations, risks, security and privacy concerns of performing an evaluation and management service by telephone and the availability of in person appointments. I also discussed with the patient that there may be a patient responsible charge related to this service. The patient expressed understanding and agreed to proceed. Subjective:  Amy Church is a 33 y.o. G4P1021 at [redacted]w[redacted]d being followed for ongoing prenatal care.  She is currently monitored for the following issues for this high-risk pregnancy and has Supervision of high risk pregnancy, antepartum; Chronic hypertension in pregnancy; GBS bacteriuria; and Low-lying placenta on their problem list.  - misplaced BP cuff, getting ready to move - still taking ASA - occasional headaches but resolve with time - tried to tough it out  got better with Tylenol once finally took it, was outside a lot  no leg swelling except in feet/hands but goes away - having a girl!  - ER visit, given Macobid but no urine culture collected   Patient reports no complaints. Reports fetal movement. Denies any contractions, bleeding or leaking of fluid.   The following portions of the patient's history were reviewed and updated as appropriate: allergies, current medications, past family history, past medical history, past social history, past surgical history and problem list.   Objective:  *Patient cannot find BP cuff* General:  Alert, oriented and cooperative.   Mental Status: Normal mood and affect perceived. Normal judgment and thought content.  Rest of physical exam deferred due to type of encounter  Assessment and Plan:  Pregnancy: G4P1021 at [redacted]w[redacted]d  1. Supervision of high risk pregnancy, antepartum -- prenatal record reviewed  and UTD -- anatomy U/S scheduled  2. Chronic hypertension in pregnancy -- counseled on signs/symptoms of preeclampsia -- encouraged to treat headache and call clinic if unable to resolve with normal measures -- encouraged to call clinic if worsening swelling -- continue ASA -- will start checking home BP once moves next week, BP cuff currently packed, will have in-person visit next time   3. GBS bacteriuria - Resistant to clindamycin. Referral to allergist. Could treat with vancomycin in labor. -- repeat OB urine culture at next visit   4. Low-Lying Placenta - Noted on U/S in ER. Patient declined TVUS at that time. Will follow-up with scheduled anatomy U/S in August.   Preterm labor symptoms and general obstetric precautions including but not limited to vaginal bleeding, contractions, leaking of fluid and fetal movement were reviewed in detail with the patient.  I discussed the assessment and treatment plan with the patient. The patient was provided an opportunity to ask questions and all were answered. The patient agreed with the plan and demonstrated an understanding of the instructions. The patient was advised to call back or seek an in-person office evaluation/go to MAU at [facility] for any urgent or concerning symptoms. Please refer to After Visit Summary for other counseling recommendations.   I provided 20 minutes of non-face-to-face time during this encounter.  Return in about 4 weeks (around 10/24/2018) for in-person after or before anatomy U/S .  Future Appointments  Date Time Provider [place]  10/23/2018  1:00 PM [place] [facility]  10/23/2018  1:00 PM [place] for [employer] [place]  [facility]

## 2018-10-11 NOTE — Progress Notes (Signed)
It doesn't look like she has a PCP. Can we either refer her to one or call her to see if she has one? Thanks!

## 2018-10-13 NOTE — Progress Notes (Signed)
Yes, sure can. Thank you

## 2018-10-13 NOTE — Addendum Note (Signed)
Addended by: Truett Mainland on: 10/13/2018 08:54 AM   Modules accepted: Orders

## 2018-10-13 NOTE — Progress Notes (Signed)
I've placed the referral. When setting this up, can you specifically tell them that she needs a referral to allergist for Penicillin allergy testing.

## 2018-10-23 ENCOUNTER — Ambulatory Visit (INDEPENDENT_AMBULATORY_CARE_PROVIDER_SITE_OTHER): Payer: Medicaid Other | Admitting: Obstetrics and Gynecology

## 2018-10-23 ENCOUNTER — Encounter (HOSPITAL_COMMUNITY): Payer: Self-pay | Admitting: *Deleted

## 2018-10-23 ENCOUNTER — Other Ambulatory Visit (HOSPITAL_COMMUNITY): Payer: Self-pay | Admitting: *Deleted

## 2018-10-23 ENCOUNTER — Ambulatory Visit (HOSPITAL_COMMUNITY): Payer: Medicaid Other | Admitting: *Deleted

## 2018-10-23 ENCOUNTER — Ambulatory Visit (HOSPITAL_COMMUNITY)
Admission: RE | Admit: 2018-10-23 | Discharge: 2018-10-23 | Disposition: A | Discharge status: Home / Self Care | Payer: Medicaid Other | Source: Ambulatory Visit | Attending: Family Medicine | Admitting: Family Medicine

## 2018-10-23 ENCOUNTER — Encounter: Payer: Self-pay | Admitting: Obstetrics and Gynecology

## 2018-10-23 ENCOUNTER — Other Ambulatory Visit: Payer: Self-pay

## 2018-10-23 VITALS — BP 120/77 | HR 88 | Temp 98.2°F | Wt 292.6 lb

## 2018-10-23 DIAGNOSIS — O99210 Obesity complicating pregnancy, unspecified trimester: Secondary | ICD-10-CM

## 2018-10-23 DIAGNOSIS — O99212 Obesity complicating pregnancy, second trimester: Secondary | ICD-10-CM

## 2018-10-23 DIAGNOSIS — O10912 Unspecified pre-existing hypertension complicating pregnancy, second trimester: Secondary | ICD-10-CM

## 2018-10-23 DIAGNOSIS — O10919 Unspecified pre-existing hypertension complicating pregnancy, unspecified trimester: Secondary | ICD-10-CM

## 2018-10-23 DIAGNOSIS — R8271 Bacteriuria: Secondary | ICD-10-CM

## 2018-10-23 DIAGNOSIS — Z6841 Body Mass Index (BMI) 40.0 and over, adult: Secondary | ICD-10-CM

## 2018-10-23 DIAGNOSIS — O0990 Supervision of high risk pregnancy, unspecified, unspecified trimester: Secondary | ICD-10-CM

## 2018-10-23 DIAGNOSIS — O4442 Low lying placenta NOS or without hemorrhage, second trimester: Secondary | ICD-10-CM

## 2018-10-23 DIAGNOSIS — Z3A19 19 weeks gestation of pregnancy: Secondary | ICD-10-CM

## 2018-10-23 DIAGNOSIS — O4440 Low lying placenta NOS or without hemorrhage, unspecified trimester: Secondary | ICD-10-CM

## 2018-10-23 DIAGNOSIS — O99820 Streptococcus B carrier state complicating pregnancy: Secondary | ICD-10-CM

## 2018-10-23 DIAGNOSIS — O0992 Supervision of high risk pregnancy, unspecified, second trimester: Secondary | ICD-10-CM

## 2018-10-23 HISTORY — DX: Body Mass Index (BMI) 40.0 and over, adult: Z684

## 2018-10-23 NOTE — Progress Notes (Signed)
Prenatal Visit Note Date: 10/23/2018 Clinic: [facility]  Subjective:  Amy Church is a 33 y.o. [phone] at [redacted]w[redacted]d being seen today for ongoing prenatal care.  She is currently monitored for the following issues for this high-risk pregnancy and has Supervision of high risk pregnancy, antepartum; Chronic hypertension in pregnancy; GBS bacteriuria; Low-lying placenta; BMI 40.0-44.9, adult ([place]); and Obesity in pregnancy on their problem list.  Patient reports no complaints.   Contractions: Not present. Vag. Bleeding: None.  Movement: Absent. Denies leaking of fluid.   The following portions of the patient's history were reviewed and updated as appropriate: allergies, current medications, past family history, past medical history, past social history, past surgical history and problem list. Problem list updated.  Objective:   Vitals:   10/23/18 1132  BP: 120/77  Pulse: 88  Temp: 98.2 F (36.8 C)  Weight: 292 lb 9.6 oz (132.7 kg)    Fetal Status: Fetal Heart Rate (bpm): 140s   Movement: Absent     General:  Alert, oriented and cooperative. Patient is in no acute distress.  Skin: Skin is warm and dry. No rash noted.   Cardiovascular: Normal heart rate noted  Respiratory: Normal respiratory effort, no problems with respiration noted  Abdomen: Soft, gravid, appropriate for gestational age. Pain/Pressure: Present     Pelvic:  Cervical exam deferred        Extremities: Normal range of motion.  Edema: Trace  Mental Status: Normal mood and affect. Normal behavior. Normal judgment and thought content.   Urinalysis:      Assessment and Plan:  Pregnancy: G4P1021 at [redacted]w[redacted]d  1. Supervision of high risk pregnancy, antepartum Routine care. Anatomy u/s today. - Culture, OB Urine  2. Chronic hypertension in pregnancy Doing well on no meds. Recommend low dose ASA  3. GBS bacteriuria toc today - Culture, OB Urine  4. Low-lying placenta F/u u/s later today  5. BMI  40.0-44.9, adult ([place]) Pt told to keep eye on weight. Goals d/w her  6. Obesity in pregnancy  Preterm labor symptoms and general obstetric precautions including but not limited to vaginal bleeding, contractions, leaking of fluid and fetal movement were reviewed in detail with the patient. Please refer to After Visit Summary for other counseling recommendations.  Return in about 1 month (around 11/23/2018).with mfm surveillance growth u/s   Aletha Halim, MD

## 2018-10-24 LAB — POCT URINALYSIS DIP (DEVICE)
Bilirubin Urine: NEGATIVE
Glucose, UA: NEGATIVE mg/dL
Hgb urine dipstick: NEGATIVE
Ketones, ur: NEGATIVE mg/dL
Leukocytes,Ua: NEGATIVE
Nitrite: NEGATIVE
Protein, ur: NEGATIVE mg/dL
Specific Gravity, Urine: >=1.03 (ref 1.005–1.030)
Urobilinogen, UA: 0.2 mg/dL (ref 0.0–1.0)
pH: 5.5 (ref 5.0–8.0)

## 2018-10-26 ENCOUNTER — Encounter: Payer: Self-pay | Admitting: *Deleted

## 2018-10-27 LAB — URINE CULTURE, OB REFLEX

## 2018-10-27 LAB — CULTURE, OB URINE

## 2018-10-30 ENCOUNTER — Other Ambulatory Visit: Payer: Self-pay | Admitting: Obstetrics and Gynecology

## 2018-10-30 MED ORDER — FOSFOMYCIN TROMETHAMINE 3 G PO PACK: 3.0000 g | PACK | Freq: Once | ORAL | 0 refills | Status: AC

## 2018-11-20 ENCOUNTER — Telehealth: Payer: Self-pay | Admitting: Family Medicine

## 2018-11-20 NOTE — Telephone Encounter (Signed)
Called the patient to confirm the upcoming visit. The patient answered no to the covid screening questions. Advised the patient of no visitors or children are permitted due to covid restrictions. The patient verbalized understanding. °

## 2018-11-21 ENCOUNTER — Ambulatory Visit (HOSPITAL_COMMUNITY): Payer: Medicaid Other | Admitting: *Deleted

## 2018-11-21 ENCOUNTER — Ambulatory Visit (INDEPENDENT_AMBULATORY_CARE_PROVIDER_SITE_OTHER): Payer: Medicaid Other | Admitting: Obstetrics and Gynecology

## 2018-11-21 ENCOUNTER — Ambulatory Visit (HOSPITAL_COMMUNITY)
Admission: RE | Admit: 2018-11-21 | Discharge: 2018-11-21 | Disposition: A | Discharge status: Home / Self Care | Payer: Medicaid Other | Source: Ambulatory Visit | Attending: Obstetrics and Gynecology | Admitting: Obstetrics and Gynecology

## 2018-11-21 ENCOUNTER — Other Ambulatory Visit: Payer: Self-pay

## 2018-11-21 ENCOUNTER — Other Ambulatory Visit (HOSPITAL_COMMUNITY): Payer: Self-pay | Admitting: *Deleted

## 2018-11-21 ENCOUNTER — Encounter (HOSPITAL_COMMUNITY): Payer: Self-pay

## 2018-11-21 VITALS — BP 123/85 | HR 109 | Temp 98.1°F | Wt 293.9 lb

## 2018-11-21 DIAGNOSIS — Z23 Encounter for immunization: Secondary | ICD-10-CM | POA: Diagnosis not present

## 2018-11-21 DIAGNOSIS — Z6841 Body Mass Index (BMI) 40.0 and over, adult: Secondary | ICD-10-CM

## 2018-11-21 DIAGNOSIS — O10919 Unspecified pre-existing hypertension complicating pregnancy, unspecified trimester: Secondary | ICD-10-CM | POA: Insufficient documentation

## 2018-11-21 DIAGNOSIS — O0990 Supervision of high risk pregnancy, unspecified, unspecified trimester: Secondary | ICD-10-CM

## 2018-11-21 DIAGNOSIS — R8271 Bacteriuria: Secondary | ICD-10-CM

## 2018-11-21 DIAGNOSIS — Z3A23 23 weeks gestation of pregnancy: Secondary | ICD-10-CM | POA: Diagnosis not present

## 2018-11-21 DIAGNOSIS — O10912 Unspecified pre-existing hypertension complicating pregnancy, second trimester: Secondary | ICD-10-CM

## 2018-11-21 DIAGNOSIS — O0992 Supervision of high risk pregnancy, unspecified, second trimester: Secondary | ICD-10-CM | POA: Diagnosis not present

## 2018-11-21 DIAGNOSIS — O99212 Obesity complicating pregnancy, second trimester: Secondary | ICD-10-CM | POA: Diagnosis not present

## 2018-11-21 DIAGNOSIS — O99210 Obesity complicating pregnancy, unspecified trimester: Secondary | ICD-10-CM

## 2018-11-21 DIAGNOSIS — O4442 Low lying placenta NOS or without hemorrhage, second trimester: Secondary | ICD-10-CM

## 2018-11-21 DIAGNOSIS — O10012 Pre-existing essential hypertension complicating pregnancy, second trimester: Secondary | ICD-10-CM | POA: Diagnosis not present

## 2018-11-21 DIAGNOSIS — Z362 Encounter for other antenatal screening follow-up: Secondary | ICD-10-CM

## 2018-11-21 DIAGNOSIS — O4440 Low lying placenta NOS or without hemorrhage, unspecified trimester: Secondary | ICD-10-CM

## 2018-11-21 NOTE — Progress Notes (Signed)
Prenatal Visit Note Date: 11/21/2018 Clinic: [facility]  Subjective:  Amy Church is a 33 y.o. [phone] at [redacted]w[redacted]d being seen today for ongoing prenatal care.  She is currently monitored for the following issues for this high-risk pregnancy and has Supervision of high risk pregnancy, antepartum; Chronic hypertension in pregnancy; GBS bacteriuria; Low-lying placenta; BMI 40.0-44.9, adult ([place]); and Obesity in pregnancy on their problem list.  Patient reports no complaints.   Contractions: Not present. Vag. Bleeding: None.  Movement: Present. Denies leaking of fluid.   The following portions of the patient's history were reviewed and updated as appropriate: allergies, current medications, past family history, past medical history, past social history, past surgical history and problem list. Problem list updated.  Objective:   Vitals:   11/21/18 1105  BP: 123/85  Pulse: (!) 109  Temp: 98.1 F (36.7 C)  Weight: 293 lb 14.4 oz (133.3 kg)    Fetal Status: Fetal Heart Rate (bpm): 144   Movement: Present     General:  Alert, oriented and cooperative. Patient is in no acute distress.  Skin: Skin is warm and dry. No rash noted.   Cardiovascular: Normal heart rate noted  Respiratory: Normal respiratory effort, no problems with respiration noted  Abdomen: Soft, gravid, appropriate for gestational age. Pain/Pressure: Absent     Pelvic:  Cervical exam deferred        Extremities: Normal range of motion.  Edema: None  Mental Status: Normal mood and affect. Normal behavior. Normal judgment and thought content.   Urinalysis:      Assessment and Plan:  Pregnancy: G4P1021 at [redacted]w[redacted]d  1. Chronic hypertension in pregnancy Doing well just on low dose asa Follow up serial u/s  2. Supervision of high risk pregnancy, antepartum Routine care. 28wk labs nv. Pt works 2100-0700 so will do 1 hour - CBC; Future - RPR; Future - HIV antibody (with reflex); Future - Glucose  tolerance, 1 hour; Future  3. GBS bacteriuria toc nv  4. Low-lying placenta resolved  5. BMI 40.0-44.9, adult ([place]) Continue to follow  6. Obesity in pregnancy  Preterm labor symptoms and general obstetric precautions including but not limited to vaginal bleeding, contractions, leaking of fluid and fetal movement were reviewed in detail with the patient. Please refer to After Visit Summary for other counseling recommendations.  Return in about 3 weeks (around 12/12/2018) for high risk, in person, 1h GTT.   Aletha Halim, MD

## 2018-12-15 ENCOUNTER — Other Ambulatory Visit: Payer: Self-pay

## 2018-12-15 ENCOUNTER — Encounter: Payer: Self-pay | Admitting: Obstetrics & Gynecology

## 2018-12-15 ENCOUNTER — Ambulatory Visit (INDEPENDENT_AMBULATORY_CARE_PROVIDER_SITE_OTHER): Payer: Medicaid Other | Admitting: Obstetrics & Gynecology

## 2018-12-15 ENCOUNTER — Other Ambulatory Visit (HOSPITAL_COMMUNITY)
Admission: RE | Admit: 2018-12-15 | Discharge: 2018-12-15 | Disposition: A | Discharge status: Home / Self Care | Payer: Medicaid Other | Source: Ambulatory Visit | Attending: Obstetrics & Gynecology | Admitting: Obstetrics & Gynecology

## 2018-12-15 VITALS — BP 107/80 | HR 95 | Temp 98.1°F | Wt 294.9 lb

## 2018-12-15 DIAGNOSIS — O10919 Unspecified pre-existing hypertension complicating pregnancy, unspecified trimester: Secondary | ICD-10-CM

## 2018-12-15 DIAGNOSIS — O10912 Unspecified pre-existing hypertension complicating pregnancy, second trimester: Secondary | ICD-10-CM | POA: Diagnosis not present

## 2018-12-15 DIAGNOSIS — Z23 Encounter for immunization: Secondary | ICD-10-CM

## 2018-12-15 DIAGNOSIS — N898 Other specified noninflammatory disorders of vagina: Secondary | ICD-10-CM | POA: Diagnosis present

## 2018-12-15 DIAGNOSIS — O26893 Other specified pregnancy related conditions, third trimester: Secondary | ICD-10-CM | POA: Diagnosis present

## 2018-12-15 DIAGNOSIS — O26892 Other specified pregnancy related conditions, second trimester: Secondary | ICD-10-CM

## 2018-12-15 DIAGNOSIS — O0992 Supervision of high risk pregnancy, unspecified, second trimester: Secondary | ICD-10-CM

## 2018-12-15 DIAGNOSIS — Z3A26 26 weeks gestation of pregnancy: Secondary | ICD-10-CM | POA: Diagnosis not present

## 2018-12-15 DIAGNOSIS — O0990 Supervision of high risk pregnancy, unspecified, unspecified trimester: Secondary | ICD-10-CM

## 2018-12-15 MED ORDER — METRONIDAZOLE 0.75 % VA GEL: 1.0000 | Freq: Every day | VAGINAL | 1 refills | Status: DC

## 2018-12-15 NOTE — Progress Notes (Signed)
PRENATAL VISIT NOTE  Subjective:  Amy Church is a 33 y.o. G4P1021 at [redacted]w[redacted]d being seen today for ongoing prenatal care.  She is currently monitored for the following issues for this high-risk pregnancy and has Supervision of high risk pregnancy, antepartum; Chronic hypertension in pregnancy; GBS bacteriuria; BMI 40.0-44.9, adult ([place]); and Obesity in pregnancy on their problem list.  Patient reports vaginal irritation and abnormal vaginal discharge for a few days.  Contractions: Not present. Vag. Bleeding: None.  Movement: Present. Denies leaking of fluid.   The following portions of the patient's history were reviewed and updated as appropriate: allergies, current medications, past family history, past medical history, past social history, past surgical history and problem list.   Objective:   Vitals:   12/15/18 1007  BP: 107/80  Pulse: 95  Temp: 98.1 F (36.7 C)  Weight: 294 lb 14.4 oz (133.8 kg)    Fetal Status: Fetal Heart Rate (bpm): 144   Movement: Present     General:  Alert, oriented and cooperative. Patient is in no acute distress.  Skin: Skin is warm and dry. No rash noted.   Cardiovascular: Normal heart rate noted  Respiratory: Normal respiratory effort, no problems with respiration noted  Abdomen: Soft, gravid, appropriate for gestational age.  Pain/Pressure: Present     Pelvic: Cervical exam deferred        Extremities: Normal range of motion.  Edema: None  Mental Status: Normal mood and affect. Normal behavior. Normal judgment and thought content.   Imaging: [place] Mfm Ob Follow Up  Result Date: 11/22/2018 ----------------------------------------------------------------------  OBSTETRICS REPORT                        (Signed Final 11/22/2018 07:33 am) ---------------------------------------------------------------------- Patient Info  ID #:       [id number]                          D.O.B.:  [birth year] (33 yrs)  Name:       Amy Church                  Visit Date:  11/21/2018 12:00 pm ---------------------------------------------------------------------- Performed By  Performed By:     Rodrigo Ran BS      Ref. Address:      [address]                                                              [address]  Attending:        Sander Nephew      [facility]                    MD                                        [facility]  Referred By:      [facility] ---------------------------------------------------------------------- Orders   #  Description  Code         Ordered By   1  [place] MFM OB FOLLOW UP                  [id number]     Noralee Space  ----------------------------------------------------------------------   #  Order #                    Accession #                 Episode #   1  [id number]                  [id number]                  [id number]  ---------------------------------------------------------------------- Indications   Antenatal follow-up for nonvisualized fetal    Z36.2   anatomy   Hypertension - Chronic/Pre-existing (ASA)      O10.019   Obesity complicating pregnancy, second         O99.212   trimester (pregravid BMI 43)   [redacted] weeks gestation of pregnancy                Z3A.23  ---------------------------------------------------------------------- Fetal Evaluation  Num Of Fetuses:          1  Fetal Heart Rate(bpm):   138  Cardiac Activity:        Observed  Presentation:            Cephalic  Placenta:                Anterior  P. Cord Insertion:       Previously Visualized  Amniotic Fluid  AFI FV:      Within normal limits                              Largest Pocket(cm)                              6.5 ---------------------------------------------------------------------- Biometry  BPD:      58.6  mm     G. Age:  24w 0d         71  %    CI:        78.43   %    70 - 86                                                          FL/HC:       19.5  %    19.2 - 20.8  HC:      209.3  mm      G. Age:  23w 0d         24  %    HC/AC:       1.11       1.05 - 1.21  AC:      188.6  mm     G. Age:  23w 4d         52  %    FL/BPD:      69.8  %    71 - 87  FL:       40.9  mm  G. Age:  23w 2d         37  %    FL/AC:       21.7  %    20 - 24  HUM:      40.6  mm     G. Age:  24w 5d         73  %  Est. FW:     595   gm     1 lb 5 oz     50  % ---------------------------------------------------------------------- OB History  Gravidity:    4         Term:   1        Prem:   0        SAB:   0  TOP:          2       Ectopic:  0        Living: 1 ---------------------------------------------------------------------- Gestational Age  LMP:           30w 0d        Date:  04/25/18                 EDD:   01/30/19  U/S Today:     23w 3d                                        EDD:   03/17/19  Best:          23w 2d     Det. ByMarcella Dubs         EDD:   03/18/19                                      (07/31/18) ---------------------------------------------------------------------- Anatomy  Cranium:               Appears normal         LVOT:                   Appears normal  Cavum:                 Previously seen        Aortic Arch:            Appears normal  Ventricles:            Appears normal         Ductal Arch:            Appears normal  Choroid Plexus:        Previously seen        Diaphragm:              Appears normal  Cerebellum:            Appears normal         Stomach:                Appears normal, left  sided  Posterior Fossa:       Appears normal         Abdomen:                Appears normal  Nuchal Fold:           Previously seen        Abdominal Wall:         Previously seen  Face:                  Appears normal         Cord Vessels:           Appears normal (3                         (orbits and profile)                           vessel cord)  Lips:                  Appears normal         Kidneys:                Appear normal   Palate:                Previously seen        Bladder:                Appears normal  Thoracic:              Appears normal         Spine:                  Previously seen  Heart:                 Appears normal         Upper Extremities:      Previously seen                         (4CH, axis, and                         situs)  RVOT:                  Not well visualized    Lower Extremities:      Previously seen  Other:  Feet/left heel and hands/5th digits visualized. Technically difficult due          to maternal habitus and fetal position. ---------------------------------------------------------------------- Cervix Uterus Adnexa  Cervix  Length:            4.2  cm.  Normal appearance by transabdominal scan.  Uterus  No abnormality visualized.  Left Ovary  Not visualized.  Right Ovary  Not visualized.  Cul De Sac  No free fluid seen.  Adnexa  No abnormality visualized. ---------------------------------------------------------------------- Impression  Normal interval growth.  Chronic hypertension low dose ASA  BMI >40  Suboptimal views of the fetal anatomy again seen. ---------------------------------------------------------------------- Recommendations  Follow up growth and anatomy in 4 weeks ----------------------------------------------------------------------               Lin Landsmanorenthian Booker, MD Electronically Signed Final Report   11/22/2018 07:33 am ----------------------------------------------------------------------   Assessment and Plan:  Pregnancy: [id number] at 7568w5d 1. Vaginal discharge during pregnancy in second trimester  Self swab done. She believes it is BV, Metrogel prescribed for now.  - Cervicovaginal ancillary only( [place]) - metroNIDAZOLE (METROGEL) 0.75 % vaginal gel; Place 1 Applicatorful vaginally at bedtime. Apply one applicatorful to vagina at bedtime for 5 days  Dispense: 70 g; Refill: 1  2. Chronic hypertension in pregnancy Surveillance labs ordered. Continue serial scans.  Antenatal testing to start at 32 weeks.  - Comprehensive metabolic panel - Protein / creatinine ratio, urine  3. Supervision of high risk pregnancy, antepartum Third trimester labs today. Tdap given, a;ready had Flu vaccine. - Tdap vaccine greater than or equal to 7yo IM - Glucose tolerance, 1 hour - HIV antibody (with reflex) - RPR - CBC  Preterm labor symptoms and general obstetric precautions including but not limited to vaginal bleeding, contractions, leaking of fluid and fetal movement were reviewed in detail with the patient. Please refer to After Visit Summary for other counseling recommendations.   Return in about 3 weeks (around 01/05/2019) for Virtual OB Visit 6 weeks from now: BPP, NST, OFFICE HOB.  Future Appointments  Date Time Provider Department Center  12/19/2018  7:45 AM [facility]-MFC NURSE [facility]-MFC MFC-US  12/19/2018  7:45 AM [facility]-MFC [place] 2 WH-MFCUS MFC-US    Jaynie Silverman, MD

## 2018-12-15 NOTE — Patient Instructions (Signed)
Return to office for any scheduled appointments. Call the office or go to the MAU at [facility] [place] if:  You begin to have strong, frequent contractions  Your water breaks.  Sometimes it is a big gush of fluid, sometimes it is just a trickle that keeps getting your panties wet or running down your legs  You have vaginal bleeding.  It is normal to have a small amount of spotting if your cervix was checked.   You do not feel your baby moving like normal.  If you do not, get something to eat and drink and lay down and focus on feeling your baby move.   If your baby is still not moving like normal, you should call the office or go to MAU.  Any other obstetric concerns.   

## 2018-12-16 LAB — CBC
Hematocrit: 33.3 % — ABNORMAL LOW (ref 34.0–46.6)
Hemoglobin: 11.3 g/dL (ref 11.1–15.9)
MCH: 31.1 pg (ref 26.6–33.0)
MCHC: 33.9 g/dL (ref 31.5–35.7)
MCV: 92 fL (ref 79–97)
Platelets: 396 10*3/uL (ref 150–450)
RBC: 3.63 x10E6/uL — ABNORMAL LOW (ref 3.77–5.28)
RDW: 12.7 % (ref 11.7–15.4)
WBC: 8.8 10*3/uL (ref 3.4–10.8)

## 2018-12-16 LAB — PROTEIN / CREATININE RATIO, URINE
Creatinine, Urine: 150.7 mg/dL
Protein, Ur: 13.9 mg/dL
Protein/Creat Ratio: 92 mg/g creat (ref 0–200)

## 2018-12-16 LAB — COMPREHENSIVE METABOLIC PANEL
ALT: 16 IU/L (ref 0–32)
AST: 21 IU/L (ref 0–40)
Albumin/Globulin Ratio: 1.5 (ref 1.2–2.2)
Albumin: 3.6 g/dL — ABNORMAL LOW (ref 3.8–4.8)
Alkaline Phosphatase: 55 IU/L (ref 39–117)
BUN/Creatinine Ratio: 9 (ref 9–23)
BUN: 5 mg/dL — ABNORMAL LOW (ref 6–20)
Bilirubin Total: <0.2 mg/dL (ref 0.0–1.2)
CO2: 17 mmol/L — ABNORMAL LOW (ref 20–29)
Calcium: 8.4 mg/dL — ABNORMAL LOW (ref 8.7–10.2)
Chloride: 103 mmol/L (ref 96–106)
Creatinine, Ser: 0.55 mg/dL — ABNORMAL LOW (ref 0.57–1.00)
GFR calc Af Amer: 142 mL/min/{1.73_m2} (ref >59)
GFR calc non Af Amer: 123 mL/min/{1.73_m2} (ref >59)
Globulin, Total: 2.4 g/dL (ref 1.5–4.5)
Glucose: 218 mg/dL — ABNORMAL HIGH (ref 65–99)
Potassium: 3.7 mmol/L (ref 3.5–5.2)
Sodium: 133 mmol/L — ABNORMAL LOW (ref 134–144)
Total Protein: 6 g/dL (ref 6.0–8.5)

## 2018-12-16 LAB — HIV ANTIBODY (ROUTINE TESTING W REFLEX): HIV Screen 4th Generation wRfx: NONREACTIVE

## 2018-12-16 LAB — RPR: RPR Ser Ql: NONREACTIVE

## 2018-12-16 LAB — GLUCOSE TOLERANCE, 1 HOUR: Glucose, 1Hr PP: 220 mg/dL — ABNORMAL HIGH (ref 65–199)

## 2018-12-17 ENCOUNTER — Encounter: Payer: Self-pay | Admitting: Obstetrics and Gynecology

## 2018-12-17 DIAGNOSIS — O24419 Gestational diabetes mellitus in pregnancy, unspecified control: Secondary | ICD-10-CM | POA: Insufficient documentation

## 2018-12-17 DIAGNOSIS — Z8632 Personal history of gestational diabetes: Secondary | ICD-10-CM | POA: Insufficient documentation

## 2018-12-18 ENCOUNTER — Telehealth: Payer: Self-pay

## 2018-12-18 MED ORDER — ACCU-CHEK FASTCLIX LANCETS MISC: 1.0000 | Freq: Four times a day (QID) | 12 refills | Status: DC

## 2018-12-18 MED ORDER — ACCU-CHEK GUIDE W/DEVICE KIT: 1.0000 | PACK | 0 refills | Status: DC | PRN

## 2018-12-18 MED ORDER — ACCU-CHEK GUIDE VI STRP: ORAL_STRIP | 12 refills | Status: DC

## 2018-12-18 NOTE — Telephone Encounter (Signed)
BG check supplies ordered.   Pt called to notify appt time will be at 0915 on 12/21/18 with dietician instead of previously stated time.

## 2018-12-18 NOTE — Telephone Encounter (Signed)
Pt called to notify she has gestational diabetes and will need an appt with our dietician for teaching. Pt notified of appt at [place] on 12/21/18. Verified pt's pharmacy; instructed pt to pick up BG check supplies and to bring them to dietician appt.

## 2018-12-19 ENCOUNTER — Other Ambulatory Visit (HOSPITAL_COMMUNITY): Payer: Self-pay | Admitting: *Deleted

## 2018-12-19 ENCOUNTER — Ambulatory Visit (HOSPITAL_COMMUNITY)
Admission: RE | Admit: 2018-12-19 | Discharge: 2018-12-19 | Disposition: A | Discharge status: Home / Self Care | Payer: Medicaid Other | Source: Ambulatory Visit | Attending: Maternal & Fetal Medicine | Admitting: Maternal & Fetal Medicine

## 2018-12-19 ENCOUNTER — Encounter (HOSPITAL_COMMUNITY): Payer: Self-pay

## 2018-12-19 ENCOUNTER — Ambulatory Visit (HOSPITAL_COMMUNITY): Payer: Medicaid Other | Admitting: *Deleted

## 2018-12-19 ENCOUNTER — Other Ambulatory Visit: Payer: Self-pay

## 2018-12-19 DIAGNOSIS — Z362 Encounter for other antenatal screening follow-up: Secondary | ICD-10-CM | POA: Diagnosis not present

## 2018-12-19 DIAGNOSIS — O10012 Pre-existing essential hypertension complicating pregnancy, second trimester: Secondary | ICD-10-CM

## 2018-12-19 DIAGNOSIS — O99212 Obesity complicating pregnancy, second trimester: Secondary | ICD-10-CM | POA: Diagnosis not present

## 2018-12-19 DIAGNOSIS — O10919 Unspecified pre-existing hypertension complicating pregnancy, unspecified trimester: Secondary | ICD-10-CM

## 2018-12-19 DIAGNOSIS — O10913 Unspecified pre-existing hypertension complicating pregnancy, third trimester: Secondary | ICD-10-CM

## 2018-12-19 DIAGNOSIS — O0990 Supervision of high risk pregnancy, unspecified, unspecified trimester: Secondary | ICD-10-CM | POA: Insufficient documentation

## 2018-12-19 DIAGNOSIS — Z3A27 27 weeks gestation of pregnancy: Secondary | ICD-10-CM

## 2018-12-19 LAB — CERVICOVAGINAL ANCILLARY ONLY
Bacterial Vaginitis (gardnerella): POSITIVE — AB
Candida Glabrata: NEGATIVE
Candida Vaginitis: NEGATIVE
Chlamydia: NEGATIVE
Comment: NEGATIVE
Comment: NEGATIVE
Comment: NEGATIVE
Comment: NEGATIVE
Comment: NEGATIVE
Comment: NORMAL
Neisseria Gonorrhea: NEGATIVE
Trichomonas: NEGATIVE

## 2018-12-21 ENCOUNTER — Other Ambulatory Visit: Payer: Self-pay

## 2018-12-21 ENCOUNTER — Ambulatory Visit: Payer: Medicaid Other | Admitting: *Deleted

## 2018-12-21 ENCOUNTER — Encounter: Payer: Medicaid Other | Attending: Obstetrics & Gynecology | Admitting: *Deleted

## 2018-12-21 DIAGNOSIS — Z713 Dietary counseling and surveillance: Secondary | ICD-10-CM | POA: Diagnosis not present

## 2018-12-21 DIAGNOSIS — O24419 Gestational diabetes mellitus in pregnancy, unspecified control: Secondary | ICD-10-CM | POA: Diagnosis not present

## 2018-12-21 NOTE — Progress Notes (Signed)
  Patient was seen on 12/21/2018 for Gestational Diabetes self-management. EDD 03/18/2019. Patient states no history of GDM. Diet history obtained. Patient eats good variety of all food groups but typically snacks throughout the day rather than eating set meals. 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 Her sleep time is typically from 9 AM to 2 PM most days. She expressed being very tired for this appointment.  The following learning objectives were met by the patient :   States the definition of Gestational Diabetes  States why dietary management is important in controlling blood glucose  Describes the effects of carbohydrates on blood glucose levels  Demonstrates ability to create a balanced meal plan  Demonstrates carbohydrate counting   States when to check blood glucose levels  Demonstrates proper blood glucose monitoring techniques  States the effect of stress and exercise on blood glucose levels  States the importance of limiting caffeine and abstaining from alcohol and smoking  Plan:  Aim for 3 - 4 Carb Choices per meal (45-60 grams)  Aim for 1-2 Carbs per snack Begin reading food labels for Total Carbohydrate of foods If OK with your MD, consider  increasing your activity level by walking, Arm Chair Exercises or other activity daily as tolerated Begin checking BG before breakfast and 2 hours after first bite of breakfast, lunch and dinner as directed by MD  Bring Log Book/Sheet and meter to every medical appointment OR use Baby Scripts (see below) Baby Scripts:  Patient was introduced to [employer] and states she has been trying to Log On several times since this pregnancy and is warned that there is a  Password problem. I provided her with the phone number for Baby Scripts for her to call for Help.  In the meantime, I gave her a Log Sheet to use until she can get [employer] corrected.  Take medication if  directed by MD  Patient already has a meter: Accu Chek Guide Meter came with different lancing device (Soft Clix) instead of Fast Clix. Rx for lancets was for drums used for Fast Clix. I demonstrated both devices to her and informed her to go back to pharmacy to either get correct lancets for Soft Clix or ask for Fast Clix device that the drums work with. She expressed verbal understanding. BG today fasting was 114 mg/dl.  Patient instructed to monitor glucose levels: FBS: 60 - 95 mg/dl 2 hour: <120 mg/dl  Patient received the following handouts:  Nutrition Diabetes and Pregnancy  Carbohydrate Counting List  BG Log Sheet  Patient will be seen for follow-up as needed.

## 2019-01-02 ENCOUNTER — Other Ambulatory Visit: Payer: Self-pay | Admitting: *Deleted

## 2019-01-02 ENCOUNTER — Encounter: Payer: Self-pay | Admitting: General Practice

## 2019-01-02 ENCOUNTER — Other Ambulatory Visit: Payer: Self-pay

## 2019-01-02 DIAGNOSIS — O24419 Gestational diabetes mellitus in pregnancy, unspecified control: Secondary | ICD-10-CM

## 2019-01-04 ENCOUNTER — Encounter: Payer: Self-pay | Admitting: General Practice

## 2019-01-08 ENCOUNTER — Other Ambulatory Visit: Payer: Self-pay

## 2019-01-08 ENCOUNTER — Telehealth (INDEPENDENT_AMBULATORY_CARE_PROVIDER_SITE_OTHER): Payer: Medicaid Other | Admitting: Family Medicine

## 2019-01-08 VITALS — BP 132/88 | HR 96

## 2019-01-08 DIAGNOSIS — O0993 Supervision of high risk pregnancy, unspecified, third trimester: Secondary | ICD-10-CM

## 2019-01-08 DIAGNOSIS — O0990 Supervision of high risk pregnancy, unspecified, unspecified trimester: Secondary | ICD-10-CM

## 2019-01-08 DIAGNOSIS — Z880 Allergy status to penicillin: Secondary | ICD-10-CM

## 2019-01-08 DIAGNOSIS — Z3A30 30 weeks gestation of pregnancy: Secondary | ICD-10-CM

## 2019-01-08 DIAGNOSIS — O10913 Unspecified pre-existing hypertension complicating pregnancy, third trimester: Secondary | ICD-10-CM

## 2019-01-08 DIAGNOSIS — R8271 Bacteriuria: Secondary | ICD-10-CM

## 2019-01-08 DIAGNOSIS — O10919 Unspecified pre-existing hypertension complicating pregnancy, unspecified trimester: Secondary | ICD-10-CM

## 2019-01-08 DIAGNOSIS — O24419 Gestational diabetes mellitus in pregnancy, unspecified control: Secondary | ICD-10-CM

## 2019-01-08 MED ORDER — METFORMIN HCL 500 MG PO TABS: 500.0000 mg | ORAL_TABLET | Freq: Every day | ORAL | 3 refills | Status: DC

## 2019-01-08 NOTE — Progress Notes (Signed)
I connected with  Amy Church on 01/08/19 at  9:15 AM EST by telephone and verified that I am speaking with the correct person using two identifiers.   I discussed the limitations, risks, security and privacy concerns of performing an evaluation and management service by telephone and the availability of in person appointments. I also discussed with the patient that there may be a patient responsible charge related to this service. The patient expressed understanding and agreed to proceed.  Verdell Carmine, RN 01/08/2019  9:36 AM

## 2019-01-08 NOTE — Progress Notes (Signed)
TELEHEALTH OBSTETRICS PRENATAL VIRTUAL VIDEO VISIT ENCOUNTER NOTE  Provider location: [facility] [place]   I connected with Amy Church on 01/08/19 at  9:15 AM EST by MyChart Video Encounter at home and verified that I am speaking with the correct person using two identifiers.   I discussed the limitations, risks, security and privacy concerns of performing an evaluation and management service virtually and the availability of in person appointments. I also discussed with the patient that there may be a patient responsible charge related to this service. The patient expressed understanding and agreed to proceed. Subjective:  Amy Church is a 33 y.o. G4P1021 at [redacted]w[redacted]d being seen today for ongoing prenatal care.  She is currently monitored for the following issues for this high-risk pregnancy and has Supervision of high risk pregnancy, antepartum; Chronic hypertension in pregnancy; GBS bacteriuria; BMI 40.0-44.9, adult ([facility]); Obesity in pregnancy; and GDM (gestational diabetes mellitus) on their problem list.  . GDM: Patient taking diet. Tolerating medication well. Only checking twice a day - works third shift. Checks in AM when she gets off - doesn't eat at work. Also checks after lunch Fasting: 10 out of 14 elevated  2hr PP: after lunch is controlled.  Patient reports decreased fetal movement.  Contractions: Not present. Vag. Bleeding: None.  Movement: Present. Denies any leaking of fluid.   The following portions of the patient's history were reviewed and updated as appropriate: allergies, current medications, past family history, past medical history, past social history, past surgical history and problem list.   Objective:   Vitals:   01/08/19 0938  BP: 132/88  Pulse: 96    Fetal Status:     Movement: Present     General:  Alert, oriented and cooperative. Patient is in no acute distress.  Respiratory: Normal respiratory effort, no problems with  respiration noted  Mental Status: Normal mood and affect. Normal behavior. Normal judgment and thought content.  Rest of physical exam deferred due to type of encounter  Imaging: [place] Mfm Ob Follow Up  Result Date: 12/19/2018 ----------------------------------------------------------------------  OBSTETRICS REPORT                       (Signed Final 12/19/2018 09:00 am) ---------------------------------------------------------------------- Patient Info  ID #:       [id number]                          D.O.B.:  07/31/1985 (33 yrs)  Name:       Amy Church                  Visit Date: 12/19/2018 08:16 am ---------------------------------------------------------------------- Performed By  Performed By:     Amy Church BS,      Ref. Address:     [address]                    [address]                                                             [address]  Attending:        Ma Rings MD         [facility]  [facility] ---------------------------------------------------------------------- Orders   #  Description                          Code         Ordered By   1  US MFM OB FOLLOW UP                  [id number]     Amy Church                                                        Amy Church  ----------------------------------------------------------------------   #  Order #                    Accession #                 Episode #   1  [id number]                  [id number]  ---------------------------------------------------------------------- Indications   Hypertension - Chronic/Pre-existing (ASA)      O10.019   Obesity complicating pregnancy, second         O99.212   trimester (pregravid BMI 43)(Low risk NIPS)   Encounter for other antenatal screening        Z36.2   follow-up   [redacted] weeks gestation of pregnancy                Z3A.27   ---------------------------------------------------------------------- Fetal Evaluation  Num Of Fetuses:         1  Fetal Heart Rate(bpm):  154  Cardiac Activity:       Observed  Presentation:           Breech  Placenta:               Anterior  P. Cord Insertion:      Previously Visualized  Amniotic Fluid  AFI FV:      Within normal limits                              Largest Pocket(cm)                              4.3 ---------------------------------------------------------------------- Biometry  BPD:      70.2  mm     G. Age:  28w 1d         69  %    CI:        77.79   %    70 - 86  FL/HC:      21.0   %    18.6 - 20.4  HC:      251.9  mm     G. Age:  27w 3d         24  %    HC/AC:      1.00        1.05 - 1.21  AC:      252.7  mm     G. Age:  29w 3d         94  %    FL/BPD:     75.5   %    71 - 87  FL:         53  mm     G. Age:  28w 1d         62  %    FL/AC:      21.0   %    20 - 24  Est. FW:    1276  gm    2 lb 13 oz      90  % ---------------------------------------------------------------------- OB History  Gravidity:    4         Term:   1        Prem:   0        SAB:   0  TOP:          2       Ectopic:  0        Living: 1 ---------------------------------------------------------------------- Gestational Age  LMP:           34w 0d        Date:  04/25/18                 EDD:   01/30/19  U/S Today:     28w 2d                                        EDD:   03/11/19  Best:          27w 2d     Det. ByMarcella Dubs         EDD:   03/18/19                                      (07/31/18) ---------------------------------------------------------------------- Anatomy  Cranium:               Appears normal         LVOT:                   Previously seen  Cavum:                 Previously seen        Aortic Arch:            Previously seen  Ventricles:            Previously seen        Ductal Arch:            Previously seen  Choroid Plexus:        Previously  seen        Diaphragm:              Appears normal  Cerebellum:  Previously seen        Stomach:                Appears normal, left                                                                        sided  Posterior Fossa:       Previously seen        Abdomen:                Appears normal  Nuchal Fold:           Previously seen        Abdominal Wall:         Previously seen  Face:                  Orbits and profile     Cord Vessels:           Previously seen                         previously seen  Lips:                  Appears normal         Kidneys:                Appear normal  Palate:                Previously seen        Bladder:                Appears normal  Thoracic:              Appears normal         Spine:                  Previously seen  Heart:                 Appears normal         Upper Extremities:      Previously seen                         (4CH, axis, and                         situs)  RVOT:                  Not well visualized    Lower Extremities:      Previously seen  Other:  Feet/left heel and hands/5th digits previously  visualized. Technically          difficult due to maternal habitus and fetal position. ---------------------------------------------------------------------- Cervix Uterus Adnexa  Cervix  Not visualized (advanced GA >24wks) ---------------------------------------------------------------------- Comments  This patient was seen for a follow up growth scan due to a  history of chronic hypertension.  The patient reports that she  was recently diagnosed with gestational diabetes.  She is  scheduled for diabetic teaching later this week.  She was informed that the overall EFW measures at the  upper normal limits for her gestational age.  The amniotic  fluid level was within normal limits .  A follow up exam was scheduled in 5 weeks.  We will start  weekly fetal testing at that time. ----------------------------------------------------------------------                    Johnell Comings, MD Electronically Signed Final Report   12/19/2018 09:00 am ----------------------------------------------------------------------   Assessment and Plan:  Pregnancy: M0N0272 at [redacted]w[redacted]d 1. Supervision of high risk pregnancy, antepartum Decreased fetal movmenet. Patient hasn't eaten yet. Instructed pt to eat. If baby doesn't move, then go to hospital for eval.  2. Chronic hypertension in pregnancy BP normal  3. GBS bacteriuria Intrapartum ppx  4. Gestational diabetes mellitus (GDM), antepartum, gestational diabetes method of control unspecified Patient to try to improve checking. Start metformin at night with dinner  5. Penicillin allergy   Preterm labor symptoms and general obstetric precautions including but not limited to vaginal bleeding, contractions, leaking of fluid and fetal movement were reviewed in detail with the patient. I discussed the assessment and treatment plan with the patient. The patient was provided an opportunity to ask questions and all were answered. The patient agreed with the plan and demonstrated an understanding of the instructions. The patient was advised to call back or seek an in-person office evaluation/go to MAU at [facility] for any urgent or concerning symptoms. Please refer to After Visit Summary for other counseling recommendations.   I provided 13 minutes of face-to-face time during this encounter.  No follow-ups on file.  Future Appointments  Date Time Provider [place]  01/23/2019 10:30 AM [place] 1 WH-MFCUS MFC-US  01/23/2019 11:00 AM [place] [facility], [place]

## 2019-01-16 ENCOUNTER — Telehealth: Payer: Self-pay

## 2019-01-16 NOTE — Telephone Encounter (Signed)
Please contact for troubleshooting or remove from BS DM in [place]--she does not appear to be using the product--   Called pt and confirmed with pt if she was having issues with placing her BS values in Babyscripts.  Pt states that she has already spoken with a provider and she has been writing down her BS.  Pt requested to be removed from Babyscripts.  Pt removed from Babyscripts.   Mel Almond, RN

## 2019-01-23 ENCOUNTER — Ambulatory Visit (INDEPENDENT_AMBULATORY_CARE_PROVIDER_SITE_OTHER): Payer: Medicaid Other | Admitting: Family Medicine

## 2019-01-23 ENCOUNTER — Other Ambulatory Visit: Payer: Self-pay

## 2019-01-23 ENCOUNTER — Ambulatory Visit (HOSPITAL_COMMUNITY): Payer: Medicaid Other | Admitting: *Deleted

## 2019-01-23 ENCOUNTER — Ambulatory Visit (HOSPITAL_COMMUNITY)
Admission: RE | Admit: 2019-01-23 | Discharge: 2019-01-23 | Disposition: A | Discharge status: Home / Self Care | Payer: Medicaid Other | Source: Ambulatory Visit | Attending: Obstetrics and Gynecology | Admitting: Obstetrics and Gynecology

## 2019-01-23 ENCOUNTER — Other Ambulatory Visit (HOSPITAL_COMMUNITY): Payer: Self-pay | Admitting: *Deleted

## 2019-01-23 ENCOUNTER — Encounter (HOSPITAL_COMMUNITY): Payer: Self-pay

## 2019-01-23 VITALS — BP 123/87 | HR 101 | Wt 294.9 lb

## 2019-01-23 DIAGNOSIS — O24419 Gestational diabetes mellitus in pregnancy, unspecified control: Secondary | ICD-10-CM

## 2019-01-23 DIAGNOSIS — R8271 Bacteriuria: Secondary | ICD-10-CM

## 2019-01-23 DIAGNOSIS — Z3A32 32 weeks gestation of pregnancy: Secondary | ICD-10-CM | POA: Diagnosis not present

## 2019-01-23 DIAGNOSIS — O24415 Gestational diabetes mellitus in pregnancy, controlled by oral hypoglycemic drugs: Secondary | ICD-10-CM

## 2019-01-23 DIAGNOSIS — O10913 Unspecified pre-existing hypertension complicating pregnancy, third trimester: Secondary | ICD-10-CM | POA: Diagnosis not present

## 2019-01-23 DIAGNOSIS — O10919 Unspecified pre-existing hypertension complicating pregnancy, unspecified trimester: Secondary | ICD-10-CM

## 2019-01-23 DIAGNOSIS — Z362 Encounter for other antenatal screening follow-up: Secondary | ICD-10-CM

## 2019-01-23 DIAGNOSIS — O0990 Supervision of high risk pregnancy, unspecified, unspecified trimester: Secondary | ICD-10-CM | POA: Insufficient documentation

## 2019-01-23 DIAGNOSIS — Z880 Allergy status to penicillin: Secondary | ICD-10-CM

## 2019-01-23 DIAGNOSIS — O99212 Obesity complicating pregnancy, second trimester: Secondary | ICD-10-CM

## 2019-01-23 DIAGNOSIS — O0993 Supervision of high risk pregnancy, unspecified, third trimester: Secondary | ICD-10-CM

## 2019-01-23 DIAGNOSIS — O10013 Pre-existing essential hypertension complicating pregnancy, third trimester: Secondary | ICD-10-CM | POA: Diagnosis not present

## 2019-01-23 LAB — GLUCOSE, CAPILLARY: Glucose-Capillary: 164 mg/dL — ABNORMAL HIGH (ref 70–99)

## 2019-01-23 MED ORDER — METFORMIN HCL 500 MG PO TABS: 500.0000 mg | ORAL_TABLET | Freq: Two times a day (BID) | ORAL | 3 refills | Status: DC

## 2019-01-23 NOTE — Progress Notes (Addendum)
   PRENATAL VISIT NOTE  Subjective:  Amy Church is a 33 y.o. G4P1021 at [redacted]w[redacted]d being seen today for ongoing prenatal care.  She is currently monitored for the following issues for this high-risk pregnancy and has Supervision of high risk pregnancy, antepartum; Chronic hypertension in pregnancy; GBS bacteriuria; BMI 40.0-44.9, adult ([place]); Obesity in pregnancy; and GDM (gestational diabetes mellitus) on their problem list.  Patient reports no complaints.  Contractions: Irritability. Vag. Bleeding: None.  Movement: Present. Denies leaking of fluid.   The following portions of the patient's history were reviewed and updated as appropriate: allergies, current medications, past family history, past medical history, past social history, past surgical history and problem list.   Objective:   Vitals:   01/23/19 0918  BP: 123/87  Pulse: (!) 101  Weight: 294 lb 14.4 oz (133.8 kg)    Fetal Status: Fetal Heart Rate (bpm): 143 Fundal Height: 34 cm Movement: Present     General:  Alert, oriented and cooperative. Patient is in no acute distress.  Skin: Skin is warm and dry. No rash noted.   Cardiovascular: Normal heart rate noted  Respiratory: Normal respiratory effort, no problems with respiration noted  Abdomen: Soft, gravid, appropriate for gestational age.  Pain/Pressure: Present     Pelvic: Cervical exam deferred        Extremities: Normal range of motion.  Edema: None  Mental Status: Normal mood and affect. Normal behavior. Normal judgment and thought content.   Assessment and Plan:  Pregnancy: [id number] at [redacted]w[redacted]d Amy Church was seen today for routine prenatal visit.  Diagnoses and all orders for this visit:  Supervision of high risk pregnancy, antepartum - weekly fetal testing due to cHTN and now GDMA2; BPP and OB F/u today - desires BTL; paperwork signed 10/16 - elevated GAD7 and PHQ9 but patient denies SI. Previously on medication and declines today. Declined behavioral health referral.  - RTC in 2 weeks  Chronic hypertension in pregnancy - no meds currently; BP still well-controlled - reports BP was only high when she was smoking which she quit when she found out she was pregnant - patient owns BP cuff and reports checking daily with all normal values  Gestational diabetes mellitus (GDM), antepartum, gestational diabetes method of control unspecified - currently on Metformin 500 mg qhs  - only checking fasting sugars as she reports 4x daily was "killing her fingers" - reports AM fasting values running in 120's for past week  - will increase Metformin to 500 mg BID  - ate breakfast about 2 hours ago; POC glucose in office: 164  GBS bacteriuria Penicillin allergy - from urine cx earlier in pregnancy; Clinda resistant - will need Vanc - Reports hives and SOB when she has had PCN  Preterm labor symptoms and general obstetric precautions including but not limited to vaginal bleeding, contractions, leaking of fluid and fetal movement were reviewed in detail with the patient. Please refer to After Visit Summary for other counseling recommendations.   Return in about 2 weeks (around 02/06/2019) for [facility]; in-person.  Future Appointments  Date Time Provider Heath  01/23/2019 10:30 AM WH-MFC [place] 1 WH-MFCUS MFC-US  01/23/2019 11:00 AM WH-MFC NURSE [facility]-MFC MFC-US    Chauncey Mann, MD

## 2019-01-30 ENCOUNTER — Ambulatory Visit (HOSPITAL_COMMUNITY): Payer: Medicaid Other

## 2019-01-30 ENCOUNTER — Inpatient Hospital Stay (HOSPITAL_COMMUNITY)
Admission: AD | Admit: 2019-01-30 | Discharge: 2019-01-30 | Disposition: A | Discharge status: Home / Self Care | Payer: Medicaid Other | Attending: Obstetrics & Gynecology | Admitting: Obstetrics & Gynecology

## 2019-01-30 ENCOUNTER — Encounter (HOSPITAL_COMMUNITY): Payer: Self-pay

## 2019-01-30 ENCOUNTER — Inpatient Hospital Stay (HOSPITAL_COMMUNITY): Payer: Medicaid Other

## 2019-01-30 ENCOUNTER — Other Ambulatory Visit: Payer: Self-pay

## 2019-01-30 DIAGNOSIS — O10913 Unspecified pre-existing hypertension complicating pregnancy, third trimester: Secondary | ICD-10-CM

## 2019-01-30 DIAGNOSIS — O2340 Unspecified infection of urinary tract in pregnancy, unspecified trimester: Secondary | ICD-10-CM

## 2019-01-30 DIAGNOSIS — Z7982 Long term (current) use of aspirin: Secondary | ICD-10-CM | POA: Insufficient documentation

## 2019-01-30 DIAGNOSIS — O99891 Other specified diseases and conditions complicating pregnancy: Secondary | ICD-10-CM | POA: Diagnosis not present

## 2019-01-30 DIAGNOSIS — R1030 Lower abdominal pain, unspecified: Secondary | ICD-10-CM | POA: Insufficient documentation

## 2019-01-30 DIAGNOSIS — Z3A33 33 weeks gestation of pregnancy: Secondary | ICD-10-CM | POA: Diagnosis not present

## 2019-01-30 DIAGNOSIS — N760 Acute vaginitis: Secondary | ICD-10-CM

## 2019-01-30 DIAGNOSIS — M549 Dorsalgia, unspecified: Secondary | ICD-10-CM | POA: Insufficient documentation

## 2019-01-30 DIAGNOSIS — Z7984 Long term (current) use of oral hypoglycemic drugs: Secondary | ICD-10-CM | POA: Diagnosis not present

## 2019-01-30 DIAGNOSIS — B9689 Other specified bacterial agents as the cause of diseases classified elsewhere: Secondary | ICD-10-CM | POA: Diagnosis not present

## 2019-01-30 DIAGNOSIS — O10919 Unspecified pre-existing hypertension complicating pregnancy, unspecified trimester: Secondary | ICD-10-CM

## 2019-01-30 DIAGNOSIS — O23593 Infection of other part of genital tract in pregnancy, third trimester: Secondary | ICD-10-CM

## 2019-01-30 DIAGNOSIS — Z8632 Personal history of gestational diabetes: Secondary | ICD-10-CM | POA: Insufficient documentation

## 2019-01-30 DIAGNOSIS — Z880 Allergy status to penicillin: Secondary | ICD-10-CM | POA: Diagnosis not present

## 2019-01-30 DIAGNOSIS — O26893 Other specified pregnancy related conditions, third trimester: Secondary | ICD-10-CM | POA: Diagnosis not present

## 2019-01-30 DIAGNOSIS — Z87891 Personal history of nicotine dependence: Secondary | ICD-10-CM | POA: Diagnosis not present

## 2019-01-30 DIAGNOSIS — O0990 Supervision of high risk pregnancy, unspecified, unspecified trimester: Secondary | ICD-10-CM

## 2019-01-30 LAB — URINALYSIS, ROUTINE W REFLEX MICROSCOPIC
Bilirubin Urine: NEGATIVE
Glucose, UA: >=500 mg/dL — AB
Hgb urine dipstick: NEGATIVE
Ketones, ur: 5 mg/dL — AB
Leukocytes,Ua: NEGATIVE
Nitrite: NEGATIVE
Protein, ur: 30 mg/dL — AB
Specific Gravity, Urine: 1.028 (ref 1.005–1.030)
pH: 5 (ref 5.0–8.0)

## 2019-01-30 LAB — COMPREHENSIVE METABOLIC PANEL
ALT: 14 U/L (ref 0–44)
AST: 17 U/L (ref 15–41)
Albumin: 2.6 g/dL — ABNORMAL LOW (ref 3.5–5.0)
Alkaline Phosphatase: 70 U/L (ref 38–126)
Anion gap: 17 — ABNORMAL HIGH (ref 5–15)
BUN: <5 mg/dL — ABNORMAL LOW (ref 6–20)
CO2: 16 mmol/L — ABNORMAL LOW (ref 22–32)
Calcium: 8.9 mg/dL (ref 8.9–10.3)
Chloride: 105 mmol/L (ref 98–111)
Creatinine, Ser: 0.56 mg/dL (ref 0.44–1.00)
GFR calc Af Amer: >60 mL/min (ref >60)
GFR calc non Af Amer: >60 mL/min (ref >60)
Glucose, Bld: 135 mg/dL — ABNORMAL HIGH (ref 70–99)
Potassium: 3.2 mmol/L — ABNORMAL LOW (ref 3.5–5.1)
Sodium: 138 mmol/L (ref 135–145)
Total Bilirubin: 0.1 mg/dL — ABNORMAL LOW (ref 0.3–1.2)
Total Protein: 5.8 g/dL — ABNORMAL LOW (ref 6.5–8.1)

## 2019-01-30 LAB — WET PREP, GENITAL
Sperm: NONE SEEN
Trich, Wet Prep: NONE SEEN
Yeast Wet Prep HPF POC: NONE SEEN

## 2019-01-30 MED ORDER — OXYCODONE-ACETAMINOPHEN 5-325 MG PO TABS
1.0000 | ORAL_TABLET | Freq: Once | ORAL | Status: AC
  Administered 2019-01-30: 1 via ORAL
  Filled 2019-01-30: qty 1

## 2019-01-30 MED ORDER — LACTINEX PO CHEW: 1.0000 | CHEWABLE_TABLET | Freq: Three times a day (TID) | ORAL | 3 refills | Status: DC

## 2019-01-30 MED ORDER — METRONIDAZOLE 0.75 % VA GEL: 1.0000 | Freq: Two times a day (BID) | VAGINAL | 0 refills | Status: AC

## 2019-01-30 MED ORDER — CYCLOBENZAPRINE HCL 5 MG PO TABS: 5.0000 mg | ORAL_TABLET | Freq: Three times a day (TID) | ORAL | 0 refills | Status: DC | PRN

## 2019-01-30 MED ORDER — CYCLOBENZAPRINE HCL 10 MG PO TABS
5.0000 mg | ORAL_TABLET | Freq: Once | ORAL | Status: AC
  Administered 2019-01-30: 5 mg via ORAL
  Filled 2019-01-30: qty 1

## 2019-01-30 MED ORDER — SULFAMETHOXAZOLE-TRIMETHOPRIM 800-160 MG PO TABS: 1.0000 | ORAL_TABLET | Freq: Two times a day (BID) | ORAL | 0 refills | Status: AC

## 2019-01-30 NOTE — Discharge Instructions (Signed)
Pregnancy and Urinary Tract Infection ° °A urinary tract infection (UTI) is an infection of any part of the urinary tract. This includes the kidneys, the tubes that connect your kidneys to your bladder (ureters), the bladder, and the tube that carries urine out of your body (urethra). These organs make, store, and get rid of urine in the body. Your health care provider may use other names to describe the infection. An upper UTI affects the ureters and kidneys (pyelonephritis). A lower UTI affects the bladder (cystitis) and urethra (urethritis). °Most urinary tract infections are caused by bacteria in your genital area, around the entrance to your urinary tract (urethra). These bacteria grow and cause irritation and inflammation of your urinary tract. You are more likely to develop a UTI during pregnancy because the physical and hormonal changes your body goes through can make it easier for bacteria to get into your urinary tract. Your growing baby also puts pressure on your bladder and can affect urine flow. It is important to recognize and treat UTIs in pregnancy because of the risk of serious complications for both you and your baby. °How does this affect me? °Symptoms of a UTI include: °· Needing to urinate right away (urgently). °· Frequent urination or passing small amounts of urine frequently. °· Pain or burning with urination. °· Blood in the urine. °· Urine that smells bad or unusual. °· Trouble urinating. °· Cloudy urine. °· Pain in the abdomen or lower back. °· Vaginal discharge. °You may also have: °· Vomiting or a decreased appetite. °· Confusion. °· Irritability or tiredness. °· A fever. °· Diarrhea. °How does this affect my baby? °An untreated UTI during pregnancy could lead to a kidney infection or a systemic infection, which can cause health problems that could affect your baby. Possible complications of an untreated UTI include: °· Giving birth to your baby before 37 weeks of pregnancy  (premature). °· Having a baby with a low birth weight. °· Developing high blood pressure during pregnancy (preeclampsia). °· Having a low hemoglobin level (anemia). °What can I do to lower my risk? °To prevent a UTI: °· Go to the bathroom as soon as you feel the need. Do not hold urine for long periods of time. °· Always wipe from front to back, especially after a bowel movement. Use each tissue one time when you wipe. °· Empty your bladder after sex. °· Keep your genital area dry. °· Drink 6-10 glasses of water each day. °· Do not douche or use deodorant sprays. °How is this treated? °Treatment for this condition may include: °· Antibiotic medicines that are safe to take during pregnancy. °· Other medicines to treat less common causes of UTI. °Follow these instructions at home: °· If you were prescribed an antibiotic medicine, take it as told by your health care provider. Do not stop using the antibiotic even if you start to feel better. °· Keep all follow-up visits as told by your health care provider. This is important. °Contact a health care provider if: °· Your symptoms do not improve or they get worse. °· You have abnormal vaginal discharge. °Get help right away if you: °· Have a fever. °· Have nausea and vomiting. °· Have back or side pain. °· Feel contractions in your uterus. °· Have lower belly pain. °· Have a gush of fluid from your vagina. °· Have blood in your urine. °Summary °· A urinary tract infection (UTI) is an infection of any part of the urinary tract, which includes the   kidneys, ureters, bladder, and urethra.  Most urinary tract infections are caused by bacteria in your genital area, around the entrance to your urinary tract (urethra).  You are more likely to develop a UTI during pregnancy.  If you were prescribed an antibiotic medicine, take it as told by your health care provider. Do not stop using the antibiotic even if you start to feel better. This information is not intended to  replace advice given to you by your health care provider. Make sure you discuss any questions you have with your health care provider. Document Released: 06/12/2010 Document Revised: 06/09/2018 Document Reviewed: 01/19/2018 Elsevier Patient Education  2020 [employer]   Bacterial Vaginosis  Bacterial vaginosis is an infection of the vagina. It happens when too many normal germs (healthy bacteria) grow in the vagina. This infection puts you at risk for infections from sex (STIs). Treating this infection can lower your risk for some STIs. You should also treat this if you are pregnant. It can cause your baby to be born early. Follow these instructions at home: Medicines  Take over-the-counter and prescription medicines only as told by your doctor.  Take or use your antibiotic medicine as told by your doctor. Do not stop taking or using it even if you start to feel better. General instructions  If you your sexual partner is a woman, tell her that you have this infection. She needs to get treatment if she has symptoms. If you have a female partner, he does not need to be treated.  During treatment: ? Avoid sex. ? Do not douche. ? Avoid alcohol as told. ? Avoid breastfeeding as told.  Drink enough fluid to keep your pee (urine) clear or pale yellow.  Keep your vagina and butt (rectum) clean. ? Wash the area with warm water every day. ? Wipe from front to back after you use the toilet.  Keep all follow-up visits as told by your doctor. This is important. Preventing this condition  Do not douche.  Use only warm water to wash around your vagina.  Use protection when you have sex. This includes: ? Latex condoms. ? Dental dams.  Limit how many people you have sex with. It is best to only have sex with the same person (be monogamous).  Get tested for STIs. Have your partner get tested.  Wear underwear that is cotton or lined with cotton.  Avoid tight pants and pantyhose. This is  most important in summer.  Do not use any products that have nicotine or tobacco in them. These include cigarettes and e-cigarettes. If you need help quitting, ask your doctor.  Do not use illegal drugs.  Limit how much alcohol you drink. Contact a doctor if:  Your symptoms do not get better, even after you are treated.  You have more discharge or pain when you pee (urinate).  You have a fever.  You have pain in your belly (abdomen).  You have pain with sex.  Your bleed from your vagina between periods. Summary  This infection happens when too many germs (bacteria) grow in the vagina.  Treating this condition can lower your risk for some infections from sex (STIs).  You should also treat this if you are pregnant. It can cause early (premature) birth.  Do not stop taking or using your antibiotic medicine even if you start to feel better. This information is not intended to replace advice given to you by your health care provider. Make sure you discuss any questions you  have with your health care provider. Document Released: 11/25/2007 Document Revised: 01/28/2017 Document Reviewed: 11/01/2015 Elsevier Patient Education  2020 Elsevier Inc.   Dietary Guidelines to Help Prevent Kidney Stones Kidney stones are deposits of minerals and salts that form inside your kidneys. Your risk of developing kidney stones may be greater depending on your diet, your lifestyle, the medicines you take, and whether you have certain medical conditions. Most people can reduce their chances of developing kidney stones by following the instructions below. Depending on your overall health and the type of kidney stones you tend to develop, your dietitian may give you more specific instructions. What are tips for following this plan? Reading food labels  Choose foods with "no salt added" or "low-salt" labels. Limit your sodium intake to less than 1500 mg per day.  Choose foods with calcium for each meal  and snack. Try to eat about 300 mg of calcium at each meal. Foods that contain 200-500 mg of calcium per serving include: ? 8 oz (237 ml) of milk, fortified nondairy milk, and fortified fruit juice. ? 8 oz (237 ml) of kefir, yogurt, and soy yogurt. ? 4 oz (118 ml) of tofu. ? 1 oz of cheese. ? 1 cup (300 g) of dried figs. ? 1 cup (91 g) of cooked broccoli. ? 1-3 oz can of sardines or mackerel.  Most people need 1000 to 1500 mg of calcium each day. Talk to your dietitian about how much calcium is recommended for you. Shopping  Buy plenty of fresh fruits and vegetables. Most people do not need to avoid fruits and vegetables, even if they contain nutrients that may contribute to kidney stones.  When shopping for convenience foods, choose: ? Whole pieces of fruit. ? Premade salads with dressing on the side. ? Low-fat fruit and yogurt smoothies.  Avoid buying frozen meals or prepared deli foods.  Look for foods with live cultures, such as yogurt and kefir. Cooking  Do not add salt to food when cooking. Place a salt shaker on the table and allow each person to add his or her own salt to taste.  Use vegetable protein, such as beans, textured vegetable protein (TVP), or tofu instead of meat in pasta, casseroles, and soups. Meal planning   Eat less salt, if told by your dietitian. To do this: ? Avoid eating processed or premade food. ? Avoid eating fast food.  Eat less animal protein, including cheese, meat, poultry, or fish, if told by your dietitian. To do this: ? Limit the number of times you have meat, poultry, fish, or cheese each week. Eat a diet free of meat at least 2 days a week. ? Eat only one serving each day of meat, poultry, fish, or seafood. ? When you prepare animal protein, cut pieces into small portion sizes. For most meat and fish, one serving is about the size of one deck of cards.  Eat at least 5 servings of fresh fruits and vegetables each day. To do this: ? Keep  fruits and vegetables on hand for snacks. ? Eat 1 piece of fruit or a handful of berries with breakfast. ? Have a salad and fruit at lunch. ? Have two kinds of vegetables at dinner.  Limit foods that are high in a substance called oxalate. These include: ? Spinach. ? Rhubarb. ? Beets. ? Potato chips and french fries. ? Nuts.  If you regularly take a diuretic medicine, make sure to eat at least 1-2 fruits or vegetables high in  potassium each day. These include: ? Avocado. ? Banana. ? Orange, prune, carrot, or tomato juice. ? Baked potato. ? Cabbage. ? Beans and split peas. General instructions   Drink enough fluid to keep your urine clear or pale yellow. This is the most important thing you can do.  Talk to your health care provider and dietitian about taking daily supplements. Depending on your health and the cause of your kidney stones, you may be advised: ? Not to take supplements with vitamin C. ? To take a calcium supplement. ? To take a daily probiotic supplement. ? To take other supplements such as magnesium, fish oil, or vitamin B6.  Take all medicines and supplements as told by your health care provider.  Limit alcohol intake to no more than 1 drink a day for nonpregnant women and 2 drinks a day for men. One drink equals 12 oz of beer, 5 oz of wine, or 1 oz of hard liquor.  Lose weight if told by your health care provider. Work with your dietitian to find strategies and an eating plan that works best for you. What foods are not recommended? Limit your intake of the following foods, or as told by your dietitian. Talk to your dietitian about specific foods you should avoid based on the type of kidney stones and your overall health. Grains Breads. Bagels. Rolls. Baked goods. Salted crackers. Cereal. Pasta. Vegetables Spinach. Rhubarb. Beets. Canned vegetables. Amy Church. Olives. Meats and other protein foods Nuts. Nut butters. Large portions of meat, poultry, or fish.  Salted or cured meats. Deli meats. Hot dogs. Sausages. Dairy Cheese. Beverages Regular soft drinks. Regular vegetable juice. Seasonings and other foods Seasoning blends with salt. Salad dressings. Canned soups. Soy sauce. Ketchup. Barbecue sauce. Canned pasta sauce. Casseroles. Pizza. Lasagna. Frozen meals. Potato chips. [place] fries. Summary  You can reduce your risk of kidney stones by making changes to your diet.  The most important thing you can do is drink enough fluid. You should drink enough fluid to keep your urine clear or pale yellow.  Ask your health care provider or dietitian how much protein from animal sources you should eat each day, and also how much salt and calcium you should have each day. This information is not intended to replace advice given to you by your health care provider. Make sure you discuss any questions you have with your health care provider. Document Released: 06/12/2010 Document Revised: 06/07/2018 Document Reviewed: 01/27/2016 Elsevier Patient Education  2020 [employer].

## 2019-01-30 NOTE — MAU Note (Signed)
.  Amy Church is a 33 y.o. at [redacted]w[redacted]d here in MAU reporting: sharp back pain and abdominal pain 0600 this morning. No VB or LOF. + Fetal movement.  Pain score: 10 There were no vitals filed for this visit.   FHT: 160 Lab orders placed from triage:

## 2019-01-30 NOTE — MAU Provider Note (Signed)
History     CSN: [id number]  Arrival date and time: 01/30/19 6568   First Provider Initiated Contact with Patient 01/30/19 1030      Chief Complaint  Patient presents with  . Contractions  . Abdominal Pain  . Back Pain   Amy Church is a 33 yo G4P1021 at 33.2 EGA complaining of sharp back pain and lower abdominal pain that started at 0600 this morning. She reports it as feeling tight on the low right back and pelvic area, some sharpness when she moves. It woke her up from sleep (she sleeps on her side), and she rates it as 10/10 at it's worst.  She is feeling her baby move, denies vaginal bleeding or leakage of fluid.   OB History    Gravida  4   Para  1   Term  1   Preterm      AB  2   Living  1     SAB      TAB  2   Ectopic      Multiple      Live Births  1           Past Medical History:  Diagnosis Date  . Chronic fungal otitis externa   . Gestational diabetes   . Hypertension    Did not have during previous pregnancy  . Temporomandibular jaw dysfunction     Past Surgical History:  Procedure Laterality Date  . WISDOM TOOTH EXTRACTION      Family History  Problem Relation Age of Onset  . Heart disease Paternal Grandmother   . Hypertension Paternal Grandmother   . Diabetes Paternal Grandmother   . Cancer Mother        pelvic    Social History   Tobacco Use  . Smoking status: Former Smoker    Packs/day: 0.25    Types: Cigarettes  . Smokeless tobacco: Never Used  Substance Use Topics  . Alcohol use: Not Currently  . Drug use: Not Currently    Types: Marijuana    Comment: stopped with +UPT    Allergies:  Allergies  Allergen Reactions  . Penicillins Hives, Shortness Of Breath and Swelling    Has patient had a PCN reaction causing immediate rash, facial/tongue/throat swelling, SOB or lightheadedness with hypotension: yes Has patient had a PCN reaction causing severe rash involving mucus membranes or skin necrosis: no Has  patient had a PCN reaction that required hospitalization yes Has patient had a PCN reaction occurring within the last 10 years: yes If all of the above answers are "NO", then may proceed with Cephalosporin use.     Medications Prior to Admission  Medication Sig Dispense Refill Last Dose  . Accu-Chek FastClix Lancets MISC 1 each by Percutaneous route 4 (four) times daily. 100 each 12 Past Week at Unknown time  . aspirin 81 MG chewable tablet Chew 162 mg by mouth daily.   01/29/2019 at Unknown time  . Blood Glucose Monitoring Suppl (ACCU-CHEK GUIDE) w/Device KIT 1 kit by Does not apply route as needed. 1 kit 0 Past Week at Unknown time  . glucose blood (ACCU-CHEK GUIDE) test strip Use as instructed 50 each 12 Past Week at Unknown time  . metFORMIN (GLUCOPHAGE) 500 MG tablet Take 1 tablet (500 mg total) by mouth 2 (two) times daily with a meal. 30 tablet 3 01/29/2019 at Unknown time  . prenatal vitamin w/FE, FA (PRENATAL 1 + 1) 27-1 MG TABS tablet Take 1 tablet by mouth  daily at 12 noon. 30 each 11 01/29/2019 at Unknown time  . AMBULATORY NON FORMULARY MEDICATION Blood pressure cuff/Monitored regularly at home.  ICD 10: ZO09.90 1 Device 0   . diclofenac sodium (VOLTAREN) 1 % GEL Apply 2 g topically 4 (four) times daily. (Patient not taking: Reported on 12/19/2018) 100 g 0   . Doxylamine-Pyridoxine (DICLEGIS) 10-10 MG TBEC Take 2 tablets by mouth at bedtime. If symptoms persist, add one tablet in the morning and one in the afternoon (Patient not taking: Reported on 10/23/2018) 100 tablet 5     Review of Systems Physical Exam   Blood pressure 132/74, pulse 79, temperature 98.9 F (37.2 C), temperature source Oral, resp. rate 19, height _0  (1.676 m), weight 133.4 kg, last menstrual period 04/25/2018.  Physical Exam  Nursing note and vitals reviewed. Constitutional: She is oriented to person, place, and time. She appears well-developed and well-nourished.  HENT:  Head: Normocephalic and  atraumatic.  Eyes: Pupils are equal, round, and reactive to light. Conjunctivae and EOM are normal.  Neck: Normal range of motion. Neck supple.  Cardiovascular: Normal rate, regular rhythm, normal heart sounds and intact distal pulses.  Respiratory: Effort normal and breath sounds normal.  GI: Soft. She exhibits no distension and no mass. There is abdominal tenderness (inguinal area bilaterally). There is no rebound and no guarding.  Obese, gravid  Genitourinary:    Genitourinary Comments: Frothy white vaginal discharge   Musculoskeletal:     Comments: Hypertonicity of the quadratus lumborum muscles bilaterally Lloyd punch negative bilaterally  Neurological: She is alert and oriented to person, place, and time. She has normal reflexes.  Skin: Skin is warm and dry.  Psychiatric: She has a normal mood and affect. Her behavior is normal. Judgment and thought content normal.    MAU Course  Procedures  MDM -UA + for calcium oxylate crystals, RENAL US ordered -CMP, CBC -Wet Prep, GC/CT -percocet and flexeril for pain  NST -baseline: 140 -variability: moderate -accels: 15x15 -decels: absent -interpretation: reactive  RECHECK: -pain responded well to percocet and flexeril  US Renal  Result Date: 01/30/2019 CLINICAL DATA:  Pregnant patient with onset back pain at 6 a.m. today. EXAM: RENAL / URINARY TRACT ULTRASOUND COMPLETE COMPARISON:  None. FINDINGS: Right Kidney: Renal measurements: 12.2 x 5.2 x 6.1 cm = volume: 201.9 mL . Echogenicity within normal limits. No mass or hydronephrosis visualized. Left Kidney: Renal measurements: 11.6 x 6.4 x 5.7 cm = volume: 221.6 mL. Echogenicity within normal limits. No mass or hydronephrosis visualized. Bladder: Appears normal for degree of bladder distention. Other: None. IMPRESSION: Normal examination.  No hydronephrosis. Electronically Signed   By: Inge Rise M.D.   On: 01/30/2019 12:11   Results for orders placed or performed during the  hospital encounter of 01/30/19 (from the past 24 hour(s))  Urinalysis, Routine w reflex microscopic     Status: Abnormal   Collection Time: 01/30/19 10:12 AM  Result Value Ref Range   Color, Urine YELLOW YELLOW   APPearance CLOUDY (A) CLEAR   Specific Gravity, Urine 1.028 1.005 - 1.030   pH 5.0 5.0 - 8.0   Glucose, UA >=500 (A) NEGATIVE mg/dL   Hgb urine dipstick NEGATIVE NEGATIVE   Bilirubin Urine NEGATIVE NEGATIVE   Ketones, ur 5 (A) NEGATIVE mg/dL   Protein, ur 30 (A) NEGATIVE mg/dL   Nitrite NEGATIVE NEGATIVE   Leukocytes,Ua NEGATIVE NEGATIVE   RBC / HPF 0-5 0 - 5 RBC/hpf   WBC, UA 6-10 0 - 5  WBC/hpf   Bacteria, UA FEW (A) NONE SEEN   Squamous Epithelial / LPF 21-50 0 - 5   Mucus PRESENT    Ca Oxalate Crys, UA PRESENT   Comprehensive metabolic panel     Status: Abnormal   Collection Time: 01/30/19 10:58 AM  Result Value Ref Range   Sodium 138 135 - 145 mmol/L   Potassium 3.2 (L) 3.5 - 5.1 mmol/L   Chloride 105 98 - 111 mmol/L   CO2 16 (L) 22 - 32 mmol/L   Glucose, Bld 135 (H) 70 - 99 mg/dL   BUN <5 (L) 6 - 20 mg/dL   Creatinine, Ser 0.56 0.44 - 1.00 mg/dL   Calcium 8.9 8.9 - 10.3 mg/dL   Total Protein 5.8 (L) 6.5 - 8.1 g/dL   Albumin 2.6 (L) 3.5 - 5.0 g/dL   AST 17 15 - 41 U/L   ALT 14 0 - 44 U/L   Alkaline Phosphatase 70 38 - 126 U/L   Total Bilirubin 0.1 (L) 0.3 - 1.2 mg/dL   GFR calc non Af Amer >60 >60 mL/min   GFR calc Af Amer >60 >60 mL/min   Anion gap 17 (H) 5 - 15  Wet prep, genital     Status: Abnormal   Collection Time: 01/30/19 11:38 AM   Specimen: Vaginal  Result Value Ref Range   Yeast Wet Prep HPF POC NONE SEEN NONE SEEN   Trich, Wet Prep NONE SEEN NONE SEEN   Clue Cells Wet Prep HPF POC PRESENT (A) NONE SEEN   WBC, Wet Prep HPF POC MANY (A) NONE SEEN   Sperm NONE SEEN     Assessment and Plan  33 yo G4P1021 at 33.2 EGA with acute onset back pain -scripts sent for BV and suspicious UTI, UC sent, advised probiotic 2/2 h/o yeast infections in the  past -pain improved with percocet and flexeril, script sent for flexeril -renal US negative, educated on prevention of kidney stones as calcium oxalate crystals seen in urine. -strict return precautions given -DC to home with follow up in office as scheduled  Galvin Aversa L Wilborn Membreno 01/30/2019, 10:59 AM

## 2019-01-31 LAB — CULTURE, OB URINE: Culture: NO GROWTH

## 2019-01-31 LAB — GC/CHLAMYDIA PROBE AMP (~~LOC~~) NOT AT ARMC
Chlamydia: NEGATIVE
Comment: NEGATIVE
Comment: NORMAL
Neisseria Gonorrhea: NEGATIVE

## 2019-02-06 ENCOUNTER — Other Ambulatory Visit (HOSPITAL_COMMUNITY): Payer: Self-pay | Admitting: *Deleted

## 2019-02-06 ENCOUNTER — Ambulatory Visit (HOSPITAL_COMMUNITY): Payer: Medicaid Other | Admitting: *Deleted

## 2019-02-06 ENCOUNTER — Encounter (HOSPITAL_COMMUNITY): Payer: Self-pay

## 2019-02-06 ENCOUNTER — Ambulatory Visit (HOSPITAL_COMMUNITY)
Admission: RE | Admit: 2019-02-06 | Discharge: 2019-02-06 | Disposition: A | Discharge status: Home / Self Care | Payer: Medicaid Other | Source: Ambulatory Visit | Attending: Obstetrics | Admitting: Obstetrics

## 2019-02-06 ENCOUNTER — Other Ambulatory Visit: Payer: Self-pay

## 2019-02-06 DIAGNOSIS — O0990 Supervision of high risk pregnancy, unspecified, unspecified trimester: Secondary | ICD-10-CM

## 2019-02-06 DIAGNOSIS — O10919 Unspecified pre-existing hypertension complicating pregnancy, unspecified trimester: Secondary | ICD-10-CM | POA: Insufficient documentation

## 2019-02-06 DIAGNOSIS — Z3A34 34 weeks gestation of pregnancy: Secondary | ICD-10-CM

## 2019-02-06 DIAGNOSIS — O99213 Obesity complicating pregnancy, third trimester: Secondary | ICD-10-CM

## 2019-02-06 DIAGNOSIS — O24415 Gestational diabetes mellitus in pregnancy, controlled by oral hypoglycemic drugs: Secondary | ICD-10-CM | POA: Diagnosis not present

## 2019-02-06 DIAGNOSIS — O10013 Pre-existing essential hypertension complicating pregnancy, third trimester: Secondary | ICD-10-CM

## 2019-02-09 ENCOUNTER — Encounter: Payer: Self-pay | Admitting: Obstetrics and Gynecology

## 2019-02-09 ENCOUNTER — Other Ambulatory Visit: Payer: Self-pay

## 2019-02-09 ENCOUNTER — Ambulatory Visit (INDEPENDENT_AMBULATORY_CARE_PROVIDER_SITE_OTHER): Payer: Medicaid Other | Admitting: Obstetrics and Gynecology

## 2019-02-09 VITALS — BP 121/88 | HR 103

## 2019-02-09 DIAGNOSIS — O10919 Unspecified pre-existing hypertension complicating pregnancy, unspecified trimester: Secondary | ICD-10-CM

## 2019-02-09 DIAGNOSIS — Z3A34 34 weeks gestation of pregnancy: Secondary | ICD-10-CM

## 2019-02-09 DIAGNOSIS — O10913 Unspecified pre-existing hypertension complicating pregnancy, third trimester: Secondary | ICD-10-CM

## 2019-02-09 DIAGNOSIS — R8271 Bacteriuria: Secondary | ICD-10-CM

## 2019-02-09 DIAGNOSIS — O0990 Supervision of high risk pregnancy, unspecified, unspecified trimester: Secondary | ICD-10-CM

## 2019-02-09 DIAGNOSIS — O24415 Gestational diabetes mellitus in pregnancy, controlled by oral hypoglycemic drugs: Secondary | ICD-10-CM

## 2019-02-09 DIAGNOSIS — O0993 Supervision of high risk pregnancy, unspecified, third trimester: Secondary | ICD-10-CM

## 2019-02-09 DIAGNOSIS — Z3009 Encounter for other general counseling and advice on contraception: Secondary | ICD-10-CM | POA: Insufficient documentation

## 2019-02-09 MED ORDER — METRONIDAZOLE 500 MG PO TABS: 500.0000 mg | ORAL_TABLET | Freq: Two times a day (BID) | ORAL | 0 refills | Status: DC

## 2019-02-09 NOTE — Progress Notes (Signed)
Subjective:  Amy Church is a 33 y.o. G4P1021 at [redacted]w[redacted]d being seen today for ongoing prenatal care.  She is currently monitored for the following issues for this high-risk pregnancy and has Supervision of high risk pregnancy, antepartum; Chronic hypertension in pregnancy; GBS bacteriuria; BMI 40.0-44.9, adult ([place]); Obesity in pregnancy; GDM (gestational diabetes mellitus); and Unwanted fertility on their problem list.  Patient reports general discomforts of pregnancy.  Contractions: Not present. Vag. Bleeding: None.  Movement: Present. Denies leaking of fluid.   The following portions of the patient's history were reviewed and updated as appropriate: allergies, current medications, past family history, past medical history, past social history, past surgical history and problem list. Problem list updated.  Objective:   Vitals:   02/09/19 0936  BP: 121/88  Pulse: (!) 103    Fetal Status: Fetal Heart Rate (bpm): 154   Movement: Present     General:  Alert, oriented and cooperative. Patient is in no acute distress.  Skin: Skin is warm and dry. No rash noted.   Cardiovascular: Normal heart rate noted  Respiratory: Normal respiratory effort, no problems with respiration noted  Abdomen: Soft, gravid, appropriate for gestational age. Pain/Pressure: Present     Pelvic:  Cervical exam deferred        Extremities: Normal range of motion.  Edema: None  Mental Status: Normal mood and affect. Normal behavior. Normal judgment and thought content.   Urinalysis:      Assessment and Plan:  Pregnancy: G4P1021 at [redacted]w[redacted]d  1. Supervision of high risk pregnancy, antepartum Stable  2. Chronic hypertension in pregnancy BP stable without meds Continue with weekly antenatal testing. Growth 76% 01/23/19  3. Gestational diabetes mellitus (GDM) in third trimester controlled on oral hypoglycemic drug Forgot CBG readings Only checking CBG's fasting and 2 hr PP lunch. Asked to try to add one more CBG  check Reports CBG's remain elevated Will increase Metformin to 1000 mg, po bid Declines insulin therapy Pt advised unable to increase Metformin anymore, also advised if unable to obtain CBG control, possible IOL at 37-38 weeks Continue with weekly antenatal testing  4. GBS bacteriuria Pt with HR reaction to PCN a few years back. Has not taken cephalosporins. Did not see allergist Will treat in labor with Vanc  5. Unwanted fertility BTL paper sisgned  Preterm labor symptoms and general obstetric precautions including but not limited to vaginal bleeding, contractions, leaking of fluid and fetal movement were reviewed in detail with the patient. Please refer to After Visit Summary for other counseling recommendations.  Return in about 1 week (around 02/16/2019) for OB visit, face to face, MD provider.   Chancy Milroy, MD

## 2019-02-09 NOTE — Addendum Note (Signed)
Addended by: Chancy Milroy on: 02/09/2019 10:08 AM   Modules accepted: Orders

## 2019-02-09 NOTE — Progress Notes (Signed)
`  Pt forgot to bring Paper Gluco logs

## 2019-02-09 NOTE — Patient Instructions (Signed)
Third Trimester of Pregnancy The third trimester is from week 28 through week 40 (months 7 through 9). The third trimester is a time when the unborn baby (fetus) is growing rapidly. At the end of the ninth month, the fetus is about 20 inches in length and weighs 6-10 pounds. Body changes during your third trimester Your body will continue to go through many changes during pregnancy. The changes vary from woman to woman. During the third trimester:  Your weight will continue to increase. You can expect to gain 25-35 pounds (11-16 kg) by the end of the pregnancy.  You may begin to get stretch marks on your hips, abdomen, and breasts.  You may urinate more often because the fetus is moving lower into your pelvis and pressing on your bladder.  You may develop or continue to have heartburn. This is caused by increased hormones that slow down muscles in the digestive tract.  You may develop or continue to have constipation because increased hormones slow digestion and cause the muscles that push waste through your intestines to relax.  You may develop hemorrhoids. These are swollen veins (varicose veins) in the rectum that can itch or be painful.  You may develop swollen, bulging veins (varicose veins) in your legs.  You may have increased body aches in the pelvis, back, or thighs. This is due to weight gain and increased hormones that are relaxing your joints.  You may have changes in your hair. These can include thickening of your hair, rapid growth, and changes in texture. Some women also have hair loss during or after pregnancy, or hair that feels dry or thin. Your hair will most likely return to normal after your baby is born.  Your breasts will continue to grow and they will continue to become tender. A yellow fluid (colostrum) may leak from your breasts. This is the first milk you are producing for your baby.  Your belly button may stick out.  You may notice more swelling in your hands,  face, or ankles.  You may have increased tingling or numbness in your hands, arms, and legs. The skin on your belly may also feel numb.  You may feel short of breath because of your expanding uterus.  You may have more problems sleeping. This can be caused by the size of your belly, increased need to urinate, and an increase in your body's metabolism.  You may notice the fetus "dropping," or moving lower in your abdomen (lightening).  You may have increased vaginal discharge.  You may notice your joints feel loose and you may have pain around your pelvic bone. What to expect at prenatal visits You will have prenatal exams every 2 weeks until week 36. Then you will have weekly prenatal exams. During a routine prenatal visit:  You will be weighed to make sure you and the baby are growing normally.  Your blood pressure will be taken.  Your abdomen will be measured to track your baby's growth.  The fetal heartbeat will be listened to.  Any test results from the previous visit will be discussed.  You may have a cervical check near your due date to see if your cervix has softened or thinned (effaced).  You will be tested for Group B streptococcus. This happens between 35 and 37 weeks. Your health care provider may ask you:  What your birth plan is.  How you are feeling.  If you are feeling the baby move.  If you have had any abnormal   symptoms, such as leaking fluid, bleeding, severe headaches, or abdominal cramping.  If you are using any tobacco products, including cigarettes, chewing tobacco, and electronic cigarettes.  If you have any questions. Other tests or screenings that may be performed during your third trimester include:  Blood tests that check for low iron levels (anemia).  Fetal testing to check the health, activity level, and growth of the fetus. Testing is done if you have certain medical conditions or if there are problems during the pregnancy.  Nonstress test  (NST). This test checks the health of your baby to make sure there are no signs of problems, such as the baby not getting enough oxygen. During this test, a belt is placed around your belly. The baby is made to move, and its heart rate is monitored during movement. What is false labor? False labor is a condition in which you feel small, irregular tightenings of the muscles in the womb (contractions) that usually go away with rest, changing position, or drinking water. These are called Braxton Hicks contractions. Contractions may last for hours, days, or even weeks before true labor sets in. If contractions come at regular intervals, become more frequent, increase in intensity, or become painful, you should see your health care provider. What are the signs of labor?  Abdominal cramps.  Regular contractions that start at 10 minutes apart and become stronger and more frequent with time.  Contractions that start on the top of the uterus and spread down to the lower abdomen and back.  Increased pelvic pressure and dull back pain.  A watery or bloody mucus discharge that comes from the vagina.  Leaking of amniotic fluid. This is also known as your "water breaking." It could be a slow trickle or a gush. Let your health care provider know if it has a color or strange odor. If you have any of these signs, call your health care provider right away, even if it is before your due date. Follow these instructions at home: Medicines  Follow your health care provider's instructions regarding medicine use. Specific medicines may be either safe or unsafe to take during pregnancy.  Take a prenatal vitamin that contains at least 600 micrograms (mcg) of folic acid.  If you develop constipation, try taking a stool softener if your health care provider approves. Eating and drinking   Eat a balanced diet that includes fresh fruits and vegetables, whole grains, good sources of protein such as meat, eggs, or tofu,  and low-fat dairy. Your health care provider will help you determine the amount of weight gain that is right for you.  Avoid raw meat and uncooked cheese. These carry germs that can cause birth defects in the baby.  If you have low calcium intake from food, talk to your health care provider about whether you should take a daily calcium supplement.  Eat four or five small meals rather than three large meals a day.  Limit foods that are high in fat and processed sugars, such as fried and sweet foods.  To prevent constipation: ? Drink enough fluid to keep your urine clear or pale yellow. ? Eat foods that are high in fiber, such as fresh fruits and vegetables, whole grains, and beans. Activity  Exercise only as directed by your health care provider. Most women can continue their usual exercise routine during pregnancy. Try to exercise for 30 minutes at least 5 days a week. Stop exercising if you experience uterine contractions.  Avoid heavy lifting.  Do   not exercise in extreme heat or humidity, or at high altitudes.  Wear low-heel, comfortable shoes.  Practice good posture.  You may continue to have sex unless your health care provider tells you otherwise. Relieving pain and discomfort  Take frequent breaks and rest with your legs elevated if you have leg cramps or low back pain.  Take warm sitz baths to soothe any pain or discomfort caused by hemorrhoids. Use hemorrhoid cream if your health care provider approves.  Wear a good support bra to prevent discomfort from breast tenderness.  If you develop varicose veins: ? Wear support pantyhose or compression stockings as told by your healthcare provider. ? Elevate your feet for 15 minutes, 3-4 times a day. Prenatal care  Write down your questions. Take them to your prenatal visits.  Keep all your prenatal visits as told by your health care provider. This is important. Safety  Wear your seat belt at all times when driving.  Make  a list of emergency phone numbers, including numbers for family, friends, the hospital, and police and fire departments. General instructions  Avoid cat litter boxes and soil used by cats. These carry germs that can cause birth defects in the baby. If you have a cat, ask someone to clean the litter box for you.  Do not travel far distances unless it is absolutely necessary and only with the approval of your health care provider.  Do not use hot tubs, steam rooms, or saunas.  Do not drink alcohol.  Do not use any products that contain nicotine or tobacco, such as cigarettes and e-cigarettes. If you need help quitting, ask your health care provider.  Do not use any medicinal herbs or unprescribed drugs. These chemicals affect the formation and growth of the baby.  Do not douche or use tampons or scented sanitary pads.  Do not cross your legs for long periods of time.  To prepare for the arrival of your baby: ? Take prenatal classes to understand, practice, and ask questions about labor and delivery. ? Make a trial run to the hospital. ? Visit the hospital and tour the maternity area. ? Arrange for maternity or paternity leave through employers. ? Arrange for family and friends to take care of pets while you are in the hospital. ? Purchase a rear-facing car seat and make sure you know how to install it in your car. ? Pack your hospital bag. ? Prepare the baby's nursery. Make sure to remove all pillows and stuffed animals from the baby's crib to prevent suffocation.  Visit your dentist if you have not gone during your pregnancy. Use a soft toothbrush to brush your teeth and be gentle when you floss. Contact a health care provider if:  You are unsure if you are in labor or if your water has broken.  You become dizzy.  You have mild pelvic cramps, pelvic pressure, or nagging pain in your abdominal area.  You have lower back pain.  You have persistent nausea, vomiting, or diarrhea.   You have an unusual or bad smelling vaginal discharge.  You have pain when you urinate. Get help right away if:  Your water breaks before 37 weeks.  You have regular contractions less than 5 minutes apart before 37 weeks.  You have a fever.  You are leaking fluid from your vagina.  You have spotting or bleeding from your vagina.  You have severe abdominal pain or cramping.  You have rapid weight loss or weight gain.  You have   shortness of breath with chest pain.  You notice sudden or extreme swelling of your face, hands, ankles, feet, or legs.  Your baby makes fewer than 10 movements in 2 hours.  You have severe headaches that do not go away when you take medicine.  You have vision changes. Summary  The third trimester is from week 28 through week 40, months 7 through 9. The third trimester is a time when the unborn baby (fetus) is growing rapidly.  During the third trimester, your discomfort may increase as you and your baby continue to gain weight. You may have abdominal, leg, and back pain, sleeping problems, and an increased need to urinate.  During the third trimester your breasts will keep growing and they will continue to become tender. A yellow fluid (colostrum) may leak from your breasts. This is the first milk you are producing for your baby.  False labor is a condition in which you feel small, irregular tightenings of the muscles in the womb (contractions) that eventually go away. These are called Braxton Hicks contractions. Contractions may last for hours, days, or even weeks before true labor sets in.  Signs of labor can include: abdominal cramps; regular contractions that start at 10 minutes apart and become stronger and more frequent with time; watery or bloody mucus discharge that comes from the vagina; increased pelvic pressure and dull back pain; and leaking of amniotic fluid. This information is not intended to replace advice given to you by your health  care provider. Make sure you discuss any questions you have with your health care provider. Document Released: 02/09/2001 Document Revised: 06/08/2018 Document Reviewed: 03/23/2016 [employer] Patient Education  2020 [employer]  

## 2019-02-13 ENCOUNTER — Ambulatory Visit (HOSPITAL_COMMUNITY)
Admission: RE | Admit: 2019-02-13 | Discharge: 2019-02-13 | Disposition: A | Discharge status: Home / Self Care | Payer: Medicaid Other | Source: Ambulatory Visit | Attending: Obstetrics | Admitting: Obstetrics

## 2019-02-13 ENCOUNTER — Other Ambulatory Visit: Payer: Self-pay | Admitting: Family Medicine

## 2019-02-13 ENCOUNTER — Encounter (HOSPITAL_COMMUNITY): Payer: Self-pay

## 2019-02-13 ENCOUNTER — Ambulatory Visit (HOSPITAL_COMMUNITY): Payer: Medicaid Other | Admitting: *Deleted

## 2019-02-13 ENCOUNTER — Other Ambulatory Visit: Payer: Self-pay

## 2019-02-13 DIAGNOSIS — Z3A35 35 weeks gestation of pregnancy: Secondary | ICD-10-CM

## 2019-02-13 DIAGNOSIS — O0990 Supervision of high risk pregnancy, unspecified, unspecified trimester: Secondary | ICD-10-CM | POA: Insufficient documentation

## 2019-02-13 DIAGNOSIS — O24415 Gestational diabetes mellitus in pregnancy, controlled by oral hypoglycemic drugs: Secondary | ICD-10-CM

## 2019-02-13 DIAGNOSIS — O10919 Unspecified pre-existing hypertension complicating pregnancy, unspecified trimester: Secondary | ICD-10-CM

## 2019-02-13 DIAGNOSIS — O10013 Pre-existing essential hypertension complicating pregnancy, third trimester: Secondary | ICD-10-CM

## 2019-02-13 DIAGNOSIS — O99213 Obesity complicating pregnancy, third trimester: Secondary | ICD-10-CM | POA: Diagnosis not present

## 2019-02-13 LAB — HEMOGLOBIN A1C
Est. average glucose Bld gHb Est-mCnc: 137 mg/dL
Hgb A1c MFr Bld: 6.4 % — ABNORMAL HIGH (ref 4.8–5.6)

## 2019-02-19 ENCOUNTER — Encounter: Payer: Medicaid Other | Admitting: Family Medicine

## 2019-02-21 ENCOUNTER — Encounter (HOSPITAL_COMMUNITY): Payer: Self-pay

## 2019-02-21 ENCOUNTER — Ambulatory Visit (HOSPITAL_COMMUNITY): Payer: Medicaid Other | Admitting: *Deleted

## 2019-02-21 ENCOUNTER — Ambulatory Visit (INDEPENDENT_AMBULATORY_CARE_PROVIDER_SITE_OTHER): Payer: Medicaid Other | Admitting: Obstetrics and Gynecology

## 2019-02-21 ENCOUNTER — Ambulatory Visit (HOSPITAL_COMMUNITY): Payer: Medicaid Other

## 2019-02-21 ENCOUNTER — Ambulatory Visit (HOSPITAL_BASED_OUTPATIENT_CLINIC_OR_DEPARTMENT_OTHER)
Admission: RE | Admit: 2019-02-21 | Discharge: 2019-02-21 | Disposition: A | Discharge status: Home / Self Care | Payer: Medicaid Other | Source: Ambulatory Visit | Attending: Obstetrics | Admitting: Obstetrics

## 2019-02-21 ENCOUNTER — Other Ambulatory Visit: Payer: Self-pay

## 2019-02-21 ENCOUNTER — Other Ambulatory Visit (HOSPITAL_COMMUNITY)
Admission: RE | Admit: 2019-02-21 | Discharge: 2019-02-21 | Disposition: A | Discharge status: Home / Self Care | Payer: Medicaid Other | Source: Ambulatory Visit | Attending: Obstetrics and Gynecology | Admitting: Obstetrics and Gynecology

## 2019-02-21 VITALS — BP 151/93 | HR 106 | Wt 298.7 lb

## 2019-02-21 DIAGNOSIS — O10919 Unspecified pre-existing hypertension complicating pregnancy, unspecified trimester: Secondary | ICD-10-CM

## 2019-02-21 DIAGNOSIS — O99213 Obesity complicating pregnancy, third trimester: Secondary | ICD-10-CM

## 2019-02-21 DIAGNOSIS — O0990 Supervision of high risk pregnancy, unspecified, unspecified trimester: Secondary | ICD-10-CM | POA: Insufficient documentation

## 2019-02-21 DIAGNOSIS — Z6841 Body Mass Index (BMI) 40.0 and over, adult: Secondary | ICD-10-CM

## 2019-02-21 DIAGNOSIS — O3663X0 Maternal care for excessive fetal growth, third trimester, not applicable or unspecified: Secondary | ICD-10-CM

## 2019-02-21 DIAGNOSIS — O24415 Gestational diabetes mellitus in pregnancy, controlled by oral hypoglycemic drugs: Secondary | ICD-10-CM | POA: Diagnosis not present

## 2019-02-21 DIAGNOSIS — O10913 Unspecified pre-existing hypertension complicating pregnancy, third trimester: Secondary | ICD-10-CM

## 2019-02-21 DIAGNOSIS — Z362 Encounter for other antenatal screening follow-up: Secondary | ICD-10-CM

## 2019-02-21 DIAGNOSIS — Z3A36 36 weeks gestation of pregnancy: Secondary | ICD-10-CM

## 2019-02-21 DIAGNOSIS — O0993 Supervision of high risk pregnancy, unspecified, third trimester: Secondary | ICD-10-CM

## 2019-02-21 DIAGNOSIS — R8271 Bacteriuria: Secondary | ICD-10-CM

## 2019-02-21 DIAGNOSIS — O10013 Pre-existing essential hypertension complicating pregnancy, third trimester: Secondary | ICD-10-CM

## 2019-02-21 DIAGNOSIS — O99210 Obesity complicating pregnancy, unspecified trimester: Secondary | ICD-10-CM

## 2019-02-21 DIAGNOSIS — Z3009 Encounter for other general counseling and advice on contraception: Secondary | ICD-10-CM

## 2019-02-21 MED ORDER — NIFEDIPINE ER OSMOTIC RELEASE 30 MG PO TB24: 30.0000 mg | ORAL_TABLET | Freq: Every day | ORAL | 0 refills | Status: DC

## 2019-02-21 NOTE — Progress Notes (Signed)
Prenatal Visit Note Date: 02/21/2019 Clinic: [facility]  Subjective:  Amy Church is a 33 y.o. [phone] at [redacted]w[redacted]d being seen today for ongoing prenatal care.  She is currently monitored for the following issues for this high-risk pregnancy and has Supervision of high risk pregnancy, antepartum; Chronic hypertension in pregnancy; GBS bacteriuria; BMI 40.0-44.9, adult ([place]); Obesity in pregnancy; GDM (gestational diabetes mellitus); Unwanted fertility; and Excessive fetal growth affecting management of mother in third trimester, antepartum on their problem list.  Patient reports no complaints.   Contractions: Irritability. Vag. Bleeding: None.  Movement: Present. Denies leaking of fluid.   The following portions of the patient's history were reviewed and updated as appropriate: allergies, current medications, past family history, past medical history, past social history, past surgical history and problem list. Problem list updated.  Objective:   Vitals:   02/21/19 1353  Weight: 298 lb 11.2 oz (135.5 kg)    Fetal Status:     Movement: Present     General:  Alert, oriented and cooperative. Patient is in no acute distress.  Skin: Skin is warm and dry. No rash noted.   Cardiovascular: Normal heart rate noted  Respiratory: Normal respiratory effort, no problems with respiration noted  Abdomen: Soft, gravid, appropriate for gestational age. Pain/Pressure: Present     Pelvic:  Cervical exam deferred        Extremities: Normal range of motion.  Edema: Trace  Mental Status: Normal mood and affect. Normal behavior. Normal judgment and thought content.   Urinalysis:      Assessment and Plan:  Pregnancy: G4P1021 at [redacted]w[redacted]d  1. Supervision of high risk pregnancy, antepartum Routine care BTL papers UTD Per MFM okay for delivery at 37-38wks. Pt states she ran out of strips and pharmacy didn't have refill; no record of call to clinic re: this. Refills sent in and will set  pt up for 36/6 pm or 37/0 IOL. Will check a1c today and glucose. Pt told to continue checking her sugars starting when she gets her strips today. If 2h postprandials are over 150 or am fasting above 120, pt told to come to hospital as I recommend inpatient admission for glucose control; clinic is closed for next 4 days. Pt states she's on metformin bid. - Cervicovaginal ancillary only( [place]) - CMP - CBC - Hemoglobin A1c  2. Excessive fetal growth affecting management of pregnancy in third trimester, single or unspecified fetus LGA today (3900gm, bpp 8/8) . Largest prior is 6.5 lbs. Shoulder dystocia precautions.   3. Chronic hypertension in pregnancy On no meds currently. Pt denies any s/s of pre-eclampsia Normal s1 and s2, no MRGs CTAB Abdomen: nttp, obese 1+ brachial reflexes Pre-eclampsia precautions given. Will start procardia xl 30 qday  4. Gestational diabetes mellitus (GDM) in third trimester controlled on oral hypoglycemic drug See above  5. BMI 40.0-44.9, adult ([place])  6. GBS bacteriuria tx in labor  7. Obesity in pregnancy  8. Unwanted fertility  Preterm labor symptoms and general obstetric precautions including but not limited to vaginal bleeding, contractions, leaking of fluid and fetal movement were reviewed in detail with the patient. Please refer to After Visit Summary for other counseling recommendations.  Return in about 2 weeks (around 03/07/2019) for postpartum in person RN BP check.   Aletha Halim, MD

## 2019-02-22 ENCOUNTER — Encounter (HOSPITAL_COMMUNITY): Payer: Self-pay | Admitting: Obstetrics & Gynecology

## 2019-02-22 ENCOUNTER — Other Ambulatory Visit: Payer: Self-pay

## 2019-02-22 ENCOUNTER — Inpatient Hospital Stay (HOSPITAL_COMMUNITY)
Admission: AD | Admit: 2019-02-22 | Discharge: 2019-02-26 | DRG: 798 | Disposition: A | Discharge status: Home / Self Care | Payer: Medicaid Other | Attending: Obstetrics and Gynecology | Admitting: Obstetrics and Gynecology

## 2019-02-22 ENCOUNTER — Other Ambulatory Visit (HOSPITAL_COMMUNITY): Admission: RE | Admit: 2019-02-22 | Payer: No Typology Code available for payment source | Source: Ambulatory Visit

## 2019-02-22 ENCOUNTER — Telehealth (HOSPITAL_COMMUNITY): Payer: Self-pay | Admitting: *Deleted

## 2019-02-22 DIAGNOSIS — E669 Obesity, unspecified: Secondary | ICD-10-CM | POA: Diagnosis present

## 2019-02-22 DIAGNOSIS — Z20828 Contact with and (suspected) exposure to other viral communicable diseases: Secondary | ICD-10-CM | POA: Diagnosis present

## 2019-02-22 DIAGNOSIS — Z302 Encounter for sterilization: Secondary | ICD-10-CM

## 2019-02-22 DIAGNOSIS — O0990 Supervision of high risk pregnancy, unspecified, unspecified trimester: Secondary | ICD-10-CM

## 2019-02-22 DIAGNOSIS — O114 Pre-existing hypertension with pre-eclampsia, complicating childbirth: Secondary | ICD-10-CM | POA: Diagnosis present

## 2019-02-22 DIAGNOSIS — O24429 Gestational diabetes mellitus in childbirth, unspecified control: Secondary | ICD-10-CM | POA: Diagnosis not present

## 2019-02-22 DIAGNOSIS — Z880 Allergy status to penicillin: Secondary | ICD-10-CM | POA: Diagnosis not present

## 2019-02-22 DIAGNOSIS — Z87891 Personal history of nicotine dependence: Secondary | ICD-10-CM | POA: Diagnosis not present

## 2019-02-22 DIAGNOSIS — O1414 Severe pre-eclampsia complicating childbirth: Secondary | ICD-10-CM | POA: Diagnosis present

## 2019-02-22 DIAGNOSIS — I10 Essential (primary) hypertension: Secondary | ICD-10-CM | POA: Diagnosis present

## 2019-02-22 DIAGNOSIS — O24425 Gestational diabetes mellitus in childbirth, controlled by oral hypoglycemic drugs: Secondary | ICD-10-CM | POA: Diagnosis present

## 2019-02-22 DIAGNOSIS — Z3A36 36 weeks gestation of pregnancy: Secondary | ICD-10-CM

## 2019-02-22 DIAGNOSIS — Z3009 Encounter for other general counseling and advice on contraception: Secondary | ICD-10-CM | POA: Diagnosis present

## 2019-02-22 DIAGNOSIS — O99824 Streptococcus B carrier state complicating childbirth: Secondary | ICD-10-CM | POA: Diagnosis present

## 2019-02-22 DIAGNOSIS — O10919 Unspecified pre-existing hypertension complicating pregnancy, unspecified trimester: Secondary | ICD-10-CM

## 2019-02-22 DIAGNOSIS — O99214 Obesity complicating childbirth: Secondary | ICD-10-CM | POA: Diagnosis present

## 2019-02-22 DIAGNOSIS — O1413 Severe pre-eclampsia, third trimester: Secondary | ICD-10-CM | POA: Diagnosis present

## 2019-02-22 DIAGNOSIS — O1002 Pre-existing essential hypertension complicating childbirth: Secondary | ICD-10-CM | POA: Diagnosis present

## 2019-02-22 DIAGNOSIS — O99820 Streptococcus B carrier state complicating pregnancy: Secondary | ICD-10-CM | POA: Diagnosis not present

## 2019-02-22 DIAGNOSIS — E1165 Type 2 diabetes mellitus with hyperglycemia: Secondary | ICD-10-CM | POA: Diagnosis present

## 2019-02-22 DIAGNOSIS — R8271 Bacteriuria: Secondary | ICD-10-CM | POA: Diagnosis present

## 2019-02-22 DIAGNOSIS — O3663X0 Maternal care for excessive fetal growth, third trimester, not applicable or unspecified: Secondary | ICD-10-CM | POA: Diagnosis present

## 2019-02-22 DIAGNOSIS — Z8759 Personal history of other complications of pregnancy, childbirth and the puerperium: Secondary | ICD-10-CM | POA: Diagnosis present

## 2019-02-22 DIAGNOSIS — O164 Unspecified maternal hypertension, complicating childbirth: Secondary | ICD-10-CM | POA: Diagnosis not present

## 2019-02-22 DIAGNOSIS — O99210 Obesity complicating pregnancy, unspecified trimester: Secondary | ICD-10-CM | POA: Diagnosis present

## 2019-02-22 DIAGNOSIS — Z8632 Personal history of gestational diabetes: Secondary | ICD-10-CM | POA: Diagnosis present

## 2019-02-22 LAB — CBC
HCT: 34.6 % — ABNORMAL LOW (ref 36.0–46.0)
Hematocrit: 34.5 % (ref 34.0–46.6)
Hemoglobin: 12.1 g/dL (ref 11.1–15.9)
Hemoglobin: 11.9 g/dL — ABNORMAL LOW (ref 12.0–15.0)
MCH: 31.2 pg (ref 26.6–33.0)
MCH: 30.3 pg (ref 26.0–34.0)
MCHC: 35.1 g/dL (ref 31.5–35.7)
MCHC: 34.4 g/dL (ref 30.0–36.0)
MCV: 89 fL (ref 79–97)
MCV: 88 fL (ref 80.0–100.0)
Platelets: 369 10*3/uL (ref 150–450)
Platelets: 355 10*3/uL (ref 150–400)
RBC: 3.88 x10E6/uL (ref 3.77–5.28)
RBC: 3.93 MIL/uL (ref 3.87–5.11)
RDW: 13.2 % (ref 11.7–15.4)
RDW: 13.2 % (ref 11.5–15.5)
WBC: 8.6 10*3/uL (ref 3.4–10.8)
WBC: 7.8 10*3/uL (ref 4.0–10.5)
nRBC: 0 % (ref 0.0–0.2)

## 2019-02-22 LAB — CERVICOVAGINAL ANCILLARY ONLY
Chlamydia: NEGATIVE
Comment: NEGATIVE
Comment: NORMAL
Neisseria Gonorrhea: NEGATIVE

## 2019-02-22 LAB — COMPREHENSIVE METABOLIC PANEL
ALT: 10 IU/L (ref 0–32)
ALT: 15 U/L (ref 0–44)
AST: 17 IU/L (ref 0–40)
AST: 27 U/L (ref 15–41)
Albumin/Globulin Ratio: 1.3 (ref 1.2–2.2)
Albumin: 3.2 g/dL — ABNORMAL LOW (ref 3.8–4.8)
Albumin: 2.5 g/dL — ABNORMAL LOW (ref 3.5–5.0)
Alkaline Phosphatase: 113 IU/L (ref 39–117)
Alkaline Phosphatase: 94 U/L (ref 38–126)
Anion gap: 11 (ref 5–15)
BUN/Creatinine Ratio: 12 (ref 9–23)
BUN: 6 mg/dL (ref 6–20)
BUN: 5 mg/dL — ABNORMAL LOW (ref 6–20)
Bilirubin Total: <0.2 mg/dL (ref 0.0–1.2)
CO2: 19 mmol/L — ABNORMAL LOW (ref 20–29)
CO2: 20 mmol/L — ABNORMAL LOW (ref 22–32)
Calcium: 9.2 mg/dL (ref 8.7–10.2)
Calcium: 8.5 mg/dL — ABNORMAL LOW (ref 8.9–10.3)
Chloride: 104 mmol/L (ref 96–106)
Chloride: 106 mmol/L (ref 98–111)
Creatinine, Ser: 0.52 mg/dL — ABNORMAL LOW (ref 0.57–1.00)
Creatinine, Ser: 0.68 mg/dL (ref 0.44–1.00)
GFR calc Af Amer: 144 mL/min/{1.73_m2} (ref >59)
GFR calc Af Amer: >60 mL/min (ref >60)
GFR calc non Af Amer: 125 mL/min/{1.73_m2} (ref >59)
GFR calc non Af Amer: >60 mL/min (ref >60)
Globulin, Total: 2.5 g/dL (ref 1.5–4.5)
Glucose, Bld: 155 mg/dL — ABNORMAL HIGH (ref 70–99)
Glucose: 102 mg/dL — ABNORMAL HIGH (ref 65–99)
Potassium: 3.6 mmol/L (ref 3.5–5.2)
Potassium: 3.1 mmol/L — ABNORMAL LOW (ref 3.5–5.1)
Sodium: 137 mmol/L (ref 134–144)
Sodium: 137 mmol/L (ref 135–145)
Total Bilirubin: 0.2 mg/dL — ABNORMAL LOW (ref 0.3–1.2)
Total Protein: 5.7 g/dL — ABNORMAL LOW (ref 6.0–8.5)
Total Protein: 5.6 g/dL — ABNORMAL LOW (ref 6.5–8.1)

## 2019-02-22 LAB — URINALYSIS, ROUTINE W REFLEX MICROSCOPIC
Bilirubin Urine: NEGATIVE
Glucose, UA: NEGATIVE mg/dL
Hgb urine dipstick: NEGATIVE
Ketones, ur: 80 mg/dL — AB
Leukocytes,Ua: NEGATIVE
Nitrite: NEGATIVE
Protein, ur: 30 mg/dL — AB
Specific Gravity, Urine: 1.016 (ref 1.005–1.030)
pH: 6 (ref 5.0–8.0)

## 2019-02-22 LAB — HEMOGLOBIN A1C
Est. average glucose Bld gHb Est-mCnc: 137 mg/dL
Hgb A1c MFr Bld: 6.4 % — ABNORMAL HIGH (ref 4.8–5.6)

## 2019-02-22 LAB — GLUCOSE, CAPILLARY: Glucose-Capillary: 147 mg/dL — ABNORMAL HIGH (ref 70–99)

## 2019-02-22 LAB — PROTEIN / CREATININE RATIO, URINE
Creatinine, Urine: 156.72 mg/dL
Protein Creatinine Ratio: 0.15 mg/mg{Cre} (ref 0.00–0.15)
Total Protein, Urine: 24 mg/dL

## 2019-02-22 MED ORDER — FENTANYL CITRATE (PF) 100 MCG/2ML IJ SOLN
50.0000 ug | INTRAMUSCULAR | Status: DC | PRN
  Administered 2019-02-23: 50 ug via INTRAVENOUS
  Filled 2019-02-22: qty 2

## 2019-02-22 MED ORDER — OXYTOCIN BOLUS FROM INFUSION
500.0000 mL | Freq: Once | INTRAVENOUS | Status: AC
  Administered 2019-02-24: 500 mL via INTRAVENOUS

## 2019-02-22 MED ORDER — ACETAMINOPHEN 325 MG PO TABS
650.0000 mg | ORAL_TABLET | ORAL | Status: DC | PRN
  Administered 2019-02-23: 650 mg via ORAL
  Filled 2019-02-22 (×2): qty 2

## 2019-02-22 MED ORDER — LACTATED RINGERS IV SOLN: 500.0000 mL | INTRAVENOUS | Status: DC | PRN

## 2019-02-22 MED ORDER — LACTATED RINGERS IV SOLN: INTRAVENOUS | Status: DC

## 2019-02-22 MED ORDER — MAGNESIUM SULFATE 40 GM/1000ML IV SOLN
2.0000 g/h | INTRAVENOUS | Status: AC
  Administered 2019-02-22 – 2019-02-23 (×3): 2 g/h via INTRAVENOUS
  Filled 2019-02-22 (×2): qty 1000

## 2019-02-22 MED ORDER — ONDANSETRON HCL 4 MG/2ML IJ SOLN
4.0000 mg | Freq: Four times a day (QID) | INTRAMUSCULAR | Status: DC | PRN
  Administered 2019-02-23: 4 mg via INTRAVENOUS
  Filled 2019-02-22: qty 2

## 2019-02-22 MED ORDER — SOD CITRATE-CITRIC ACID 500-334 MG/5ML PO SOLN
30.0000 mL | ORAL | Status: DC | PRN
  Administered 2019-02-23: 30 mL via ORAL
  Filled 2019-02-22: qty 30

## 2019-02-22 MED ORDER — TERBUTALINE SULFATE 1 MG/ML IJ SOLN: 0.2500 mg | Freq: Once | INTRAMUSCULAR | Status: DC | PRN

## 2019-02-22 MED ORDER — MISOPROSTOL 50MCG HALF TABLET
50.0000 ug | ORAL_TABLET | ORAL | Status: DC | PRN
  Administered 2019-02-22 – 2019-02-23 (×2): 50 ug via BUCCAL
  Filled 2019-02-22 (×2): qty 1

## 2019-02-22 MED ORDER — ACETAMINOPHEN 500 MG PO TABS
1000.0000 mg | ORAL_TABLET | Freq: Once | ORAL | Status: AC
  Administered 2019-02-22: 1000 mg via ORAL
  Filled 2019-02-22: qty 2

## 2019-02-22 MED ORDER — LIDOCAINE HCL (PF) 1 % IJ SOLN
30.0000 mL | INTRAMUSCULAR | Status: AC | PRN
  Administered 2019-02-23: 11 mL via SUBCUTANEOUS

## 2019-02-22 MED ORDER — OXYTOCIN 40 UNITS IN NORMAL SALINE INFUSION - SIMPLE MED
2.5000 [IU]/h | INTRAVENOUS | Status: DC
  Filled 2019-02-22: qty 1000

## 2019-02-22 MED ORDER — VANCOMYCIN HCL IN DEXTROSE 1-5 GM/200ML-% IV SOLN
1000.0000 mg | Freq: Two times a day (BID) | INTRAVENOUS | Status: DC
  Administered 2019-02-22 – 2019-02-24 (×4): 1000 mg via INTRAVENOUS
  Filled 2019-02-22 (×5): qty 200

## 2019-02-22 MED ORDER — MAGNESIUM SULFATE BOLUS VIA INFUSION
4.0000 g | Freq: Once | INTRAVENOUS | Status: AC
  Administered 2019-02-22: 4 g via INTRAVENOUS
  Filled 2019-02-22: qty 1000

## 2019-02-22 NOTE — MAU Note (Signed)
Covid swab obtained and pt pushed RNs hand away. Could only swab just inside R nares. No symptoms

## 2019-02-22 NOTE — H&P (Signed)
OBSTETRIC ADMISSION HISTORY AND PHYSICAL  Maple Odaniel is a 33 y.o. female [phone] with IUP at 29w4dpresenting for IOL 2/2 pre-E with severe features (headache and vision changes). She reports +FMs. No LOF, VB, peripheral edema, or RUQ pain. She plans on breastfeeding. She requests BTL for birth control (consent signed 12/15/2018).  Dating: By 7 week [place]--->  Estimated Date of Delivery: 03/18/19  Sono:   @19w , normal anatomy, variable presentation, 298g, 69%ile, 0 pounds 11 ounces -LIMITED CARDIAC VIEWS  -4 CH heart, axis, and situs seen 12/19/18; ROVT not well visualized - [place]12/23/20: EFW 3902 g, 8#10, >99%, BPP 8/8  Prenatal History/Complications: Uncontrolled A2gDM (metformin) Fetal macrosomia Morbid obesity GBS bacteriuria Chronic hypertension (no meds)  Past Medical History: Past Medical History:  Diagnosis Date  . Chronic fungal otitis externa   . Gestational diabetes   . Hypertension    Did not have during previous pregnancy  . Temporomandibular jaw dysfunction     Past Surgical History: Past Surgical History:  Procedure Laterality Date  . WISDOM TOOTH EXTRACTION      Obstetrical History: OB History    Gravida  4   Para  1   Term  1   Preterm      AB  2   Living  1     SAB      TAB  2   Ectopic      Multiple      Live Births  1           Social History: Social History   Socioeconomic History  . Marital status: Single    Spouse name: Not on file  . Number of children: Not on file  . Years of education: Not on file  . Highest education level: Not on file  Occupational History    Comment: unemployed  Tobacco Use  . Smoking status: Former Smoker    Packs/day: 0.25    Types: Cigarettes  . Smokeless tobacco: Never Used  Substance and Sexual Activity  . Alcohol use: Not Currently  . Drug use: Not Currently    Types: Marijuana    Comment: stopped with +UPT  . Sexual activity: Yes    Birth control/protection: None  Other Topics  Concern  . Not on file  Social History Narrative  . Not on file   Social Determinants of Health   Financial Resource Strain:   . Difficulty of Paying Living Expenses: Not on file  Food Insecurity: No Food Insecurity  . Worried About RCharity fundraiserin the Last Year: Never true  . Ran Out of Food in the Last Year: Never true  Transportation Needs: No Transportation Needs  . Lack of Transportation (Medical): No  . Lack of Transportation (Non-Medical): No  Physical Activity:   . Days of Exercise per Week: Not on file  . Minutes of Exercise per Session: Not on file  Stress:   . Feeling of Stress : Not on file  Social Connections:   . Frequency of Communication with Friends and Family: Not on file  . Frequency of Social Gatherings with Friends and Family: Not on file  . Attends Religious Services: Not on file  . Active Member of Clubs or Organizations: Not on file  . Attends CArchivistMeetings: Not on file  . Marital Status: Not on file    Family History: Family History  Problem Relation Age of Onset  . Heart disease Paternal Grandmother   . Hypertension Paternal  Grandmother   . Diabetes Paternal Grandmother   . Cancer Mother        pelvic    Allergies: Allergies  Allergen Reactions  . Penicillins Hives, Shortness Of Breath and Swelling    Has patient had a PCN reaction causing immediate rash, facial/tongue/throat swelling, SOB or lightheadedness with hypotension: yes Has patient had a PCN reaction causing severe rash involving mucus membranes or skin necrosis: no Has patient had a PCN reaction that required hospitalization yes Has patient had a PCN reaction occurring within the last 10 years: yes If all of the above answers are "NO", then may proceed with Cephalosporin use.     Medications Prior to Admission  Medication Sig Dispense Refill Last Dose  . aspirin 81 MG chewable tablet Chew 162 mg by mouth daily.   02/22/2019 at Unknown time  . metFORMIN  (GLUCOPHAGE) 500 MG tablet Take 1 tablet (500 mg total) by mouth 2 (two) times daily with a meal. 30 tablet 3 02/22/2019 at 1700  . NIFEdipine (PROCARDIA-XL/NIFEDICAL-XL) 30 MG 24 hr tablet Take 1 tablet (30 mg total) by mouth daily. Can increase to twice a day as needed for symptomatic contractions 15 tablet 0 02/22/2019 at 1900  . prenatal vitamin w/FE, FA (PRENATAL 1 + 1) 27-1 MG TABS tablet Take 1 tablet by mouth daily at 12 noon. 30 each 11 02/22/2019 at Unknown time  . Accu-Chek FastClix Lancets MISC 1 each by Percutaneous route 4 (four) times daily. (Patient not taking: Reported on 02/21/2019) 100 each 12   . AMBULATORY NON FORMULARY MEDICATION Blood pressure cuff/Monitored regularly at home.  ICD 10: ZO09.90 1 Device 0   . Blood Glucose Monitoring Suppl (ACCU-CHEK GUIDE) w/Device KIT 1 kit by Does not apply route as needed. (Patient not taking: Reported on 02/21/2019) 1 kit 0   . cyclobenzaprine (FLEXERIL) 5 MG tablet Take 1 tablet (5 mg total) by mouth 3 (three) times daily as needed for muscle spasms. 30 tablet 0   . glucose blood (ACCU-CHEK GUIDE) test strip Use as instructed (Patient not taking: Reported on 02/21/2019) 50 each 12      Review of Systems:  All systems reviewed and negative except as stated in HPI  PE: Blood pressure (!) 146/93, pulse (!) 102, temperature 98.6 F (37 C), temperature source Oral, resp. rate 18, height 5' 6"  (1.676 m), weight 135.2 kg, last menstrual period 04/25/2018. General appearance: alert, cooperative and appears stated age Lungs: regular rate and effort Heart: regular rate  Abdomen: soft, non-tender Extremities: Homans sign is negative, no sign of DVT Presentation: cephalic EFM: 147 bpm, moderate variability, positive accels, no decels Toco: uterine irritibility Dilation: 1 Effacement (%): Thick Station: Ballotable Exam by:: Darrol Poke, CNM  Prenatal labs: ABO, Rh: --/--/O POS, O POS Performed at [facility], [place]  [address] [place], [place] [address]  [phone]) Antibody: NEG (12/24 2106) Rubella: 2.93 (06/22 1505) RPR: Non Reactive (10/16 1107)  HBsAg: Negative (06/22 1505)  HIV: Non Reactive (10/16 1107)  GBS:   positive in urine 1 hr GTT 220  Prenatal Transfer Tool  Maternal Diabetes: A2GDM (metformin), uncontrolled Genetic Screening: Normal Maternal Ultrasounds/Referrals: Other: growth Fetal Ultrasounds or other Referrals:  Referred to Materal Fetal Medicine  Maternal Substance Abuse:  No Significant Maternal Medications:  Metformin Significant Maternal Lab Results: Group B Strep positive  Results for orders placed or performed during the hospital encounter of 02/22/19 (from the past 24 hour(s))  Glucose, capillary   Collection Time: 02/22/19  8:41 PM  Result Value Ref Range   Glucose-Capillary 147 (H) 70 - 99 mg/dL  Urinalysis, Routine w reflex microscopic   Collection Time: 02/22/19  8:47 PM  Result Value Ref Range   Color, Urine YELLOW YELLOW   APPearance HAZY (A) CLEAR   Specific Gravity, Urine 1.016 1.005 - 1.030   pH 6.0 5.0 - 8.0   Glucose, UA NEGATIVE NEGATIVE mg/dL   Hgb urine dipstick NEGATIVE NEGATIVE   Bilirubin Urine NEGATIVE NEGATIVE   Ketones, ur 80 (A) NEGATIVE mg/dL   Protein, ur 30 (A) NEGATIVE mg/dL   Nitrite NEGATIVE NEGATIVE   Leukocytes,Ua NEGATIVE NEGATIVE   RBC / HPF 0-5 0 - 5 RBC/hpf   WBC, UA 0-5 0 - 5 WBC/hpf   Bacteria, UA MANY (A) NONE SEEN   Squamous Epithelial / LPF 6-10 0 - 5   Mucus PRESENT   Protein / creatinine ratio, urine   Collection Time: 02/22/19  8:50 PM  Result Value Ref Range   Creatinine, Urine 156.72 mg/dL   Total Protein, Urine 24 mg/dL   Protein Creatinine Ratio 0.15 0.00 - 0.15 mg/mg[Cre]  CBC   Collection Time: 02/22/19  9:06 PM  Result Value Ref Range   WBC 7.8 4.0 - 10.5 K/uL   RBC 3.93 3.87 - 5.11 MIL/uL   Hemoglobin 11.9 (L) 12.0 - 15.0 g/dL   HCT 34.6 (L) 36.0 - 46.0 %   MCV 88.0 80.0 - 100.0 fL   MCH 30.3 26.0 - 34.0  pg   MCHC 34.4 30.0 - 36.0 g/dL   RDW 13.2 11.5 - 15.5 %   Platelets 355 150 - 400 K/uL   nRBC 0.0 0.0 - 0.2 %  Comprehensive metabolic panel   Collection Time: 02/22/19  9:06 PM  Result Value Ref Range   Sodium 137 135 - 145 mmol/L   Potassium 3.1 (L) 3.5 - 5.1 mmol/L   Chloride 106 98 - 111 mmol/L   CO2 20 (L) 22 - 32 mmol/L   Glucose, Bld 155 (H) 70 - 99 mg/dL   BUN 5 (L) 6 - 20 mg/dL   Creatinine, Ser 0.68 0.44 - 1.00 mg/dL   Calcium 8.5 (L) 8.9 - 10.3 mg/dL   Total Protein 5.6 (L) 6.5 - 8.1 g/dL   Albumin 2.5 (L) 3.5 - 5.0 g/dL   AST 27 15 - 41 U/L   ALT 15 0 - 44 U/L   Alkaline Phosphatase 94 38 - 126 U/L   Total Bilirubin 0.2 (L) 0.3 - 1.2 mg/dL   GFR calc non Af Amer >60 >60 mL/min   GFR calc Af Amer >60 >60 mL/min   Anion gap 11 5 - 15  Type and screen   Collection Time: 02/22/19  9:06 PM  Result Value Ref Range   ABO/RH(D) O POS    Antibody Screen NEG    Sample Expiration      02/25/2019,2359 Performed at [facility], [address] [place], [place] [address]   ABO/Rh   Collection Time: 02/22/19  9:06 PM  Result Value Ref Range   ABO/RH(D)      O POS Performed at [facility] [address], [place], [place] [address]     Patient Active Problem List   Diagnosis Date Noted  . Poorly controlled diabetes mellitus ([place]) 02/22/2019  . Excessive fetal growth affecting management of mother in third trimester, antepartum 02/21/2019  . Unwanted fertility 02/09/2019  . GDM (gestational diabetes mellitus) 12/17/2018  .  BMI 40.0-44.9, adult ([place]) 10/23/2018  . Obesity in pregnancy 10/23/2018  . GBS bacteriuria 08/28/2018  . Supervision of high risk pregnancy, antepartum 08/07/2018  . Chronic hypertension in pregnancy 08/07/2018    Assessment: Devaney Segers is a 33 y.o. G4P1021 at 7w4dhere for IOL 2/2 Pre-E with severe features (HA and visual changes).  1. Labor: FB placed in MAU by VDarrol Poke CMN. First dose cyto given @2305 . Consider  more cytotec, pitocin, AROM when appropriate.  2. FWB: Cat 1, EFW 3900 by [place]. 3. Pain: epidural as desired 4. GBS: positive in urine, allergic to PCN so on Vancomycin. 5. Contraception: BTL (consent signed 12/15/2018). 6. A2GDM- not well controlled. Glucose 155 in MAU. Will recheck, if >130, initiaite glucose-stabilizer.   Plan: Admit to labor and delivery.  Risks and benefits of induction were reviewed, including failure of method, prolonged labor, need for further intervention, risk of cesarean.  Patient and family seem to understand these risks and wish to proceed. Options of cytotec, foley bulb, AROM, and pitocin reviewed, with use of each discussed.  Anticipate vaginal delivery, CS as appropriate.  Omelia Marquart L Jenayah Antu, DO  02/22/2019, 11:45 PM

## 2019-02-22 NOTE — MAU Note (Addendum)
Patient reports she was told to come in because her elevated Bps and CBG-  Last BP 168/115-took her BP pill at 1900.   Last CBG 168 around 1700.  Took Metformin afterwards.  Not complaining of any ctx/LOF/VB.  Endorses + FM.  Also complains of HA for the past 2 days, black spots in her vision and some swelling which has gone down recently.

## 2019-02-22 NOTE — Telephone Encounter (Signed)
Called pt to reschedule her covid test due to a no show.  Pt states she has BP of 158/115 with HA.  Took her BP medicine after the BP reading.  Did not take her medication this morning because she was asleep.  C/o headache and dark spots in vision.  Dr Dione Plover notifed and received instructions for pt to come to MAU for evaluation.    Recalled pt and instructed pt to come to MAU.  Stated she would gather her things and come.  Instructed to come to MAU ASAP not to wait.  Verbalized understanding.  MAU notified of arrival.

## 2019-02-23 ENCOUNTER — Inpatient Hospital Stay (HOSPITAL_COMMUNITY): Payer: Medicaid Other | Admitting: Anesthesiology

## 2019-02-23 DIAGNOSIS — O1413 Severe pre-eclampsia, third trimester: Secondary | ICD-10-CM | POA: Diagnosis not present

## 2019-02-23 LAB — GLUCOSE, CAPILLARY
Glucose-Capillary: 100 mg/dL — ABNORMAL HIGH (ref 70–99)
Glucose-Capillary: 107 mg/dL — ABNORMAL HIGH (ref 70–99)
Glucose-Capillary: 109 mg/dL — ABNORMAL HIGH (ref 70–99)
Glucose-Capillary: 117 mg/dL — ABNORMAL HIGH (ref 70–99)
Glucose-Capillary: 118 mg/dL — ABNORMAL HIGH (ref 70–99)
Glucose-Capillary: 134 mg/dL — ABNORMAL HIGH (ref 70–99)
Glucose-Capillary: 135 mg/dL — ABNORMAL HIGH (ref 70–99)

## 2019-02-23 LAB — CBC
HCT: 34.7 % — ABNORMAL LOW (ref 36.0–46.0)
HCT: 35.6 % — ABNORMAL LOW (ref 36.0–46.0)
Hemoglobin: 11.6 g/dL — ABNORMAL LOW (ref 12.0–15.0)
Hemoglobin: 12.3 g/dL (ref 12.0–15.0)
MCH: 30 pg (ref 26.0–34.0)
MCH: 30.2 pg (ref 26.0–34.0)
MCHC: 33.4 g/dL (ref 30.0–36.0)
MCHC: 34.6 g/dL (ref 30.0–36.0)
MCV: 89.7 fL (ref 80.0–100.0)
MCV: 87.5 fL (ref 80.0–100.0)
Platelets: 336 10*3/uL (ref 150–400)
Platelets: 329 10*3/uL (ref 150–400)
RBC: 3.87 MIL/uL (ref 3.87–5.11)
RBC: 4.07 MIL/uL (ref 3.87–5.11)
RDW: 13.3 % (ref 11.5–15.5)
RDW: 13.2 % (ref 11.5–15.5)
WBC: 9.9 10*3/uL (ref 4.0–10.5)
WBC: 10.1 10*3/uL (ref 4.0–10.5)
nRBC: 0 % (ref 0.0–0.2)
nRBC: 0 % (ref 0.0–0.2)

## 2019-02-23 LAB — TYPE AND SCREEN
ABO/RH(D): O POS
Antibody Screen: NEGATIVE

## 2019-02-23 LAB — SARS CORONAVIRUS 2 (TAT 6-24 HRS): SARS Coronavirus 2: NEGATIVE

## 2019-02-23 LAB — RPR: RPR Ser Ql: NONREACTIVE

## 2019-02-23 LAB — ABO/RH: ABO/RH(D): O POS

## 2019-02-23 MED ORDER — ACETAMINOPHEN 500 MG PO TABS
1000.0000 mg | ORAL_TABLET | Freq: Four times a day (QID) | ORAL | Status: DC | PRN
  Administered 2019-02-23 – 2019-02-24 (×3): 1000 mg via ORAL
  Filled 2019-02-23 (×4): qty 2

## 2019-02-23 MED ORDER — FENTANYL CITRATE (PF) 100 MCG/2ML IJ SOLN
INTRAMUSCULAR | Status: AC
  Filled 2019-02-23: qty 2

## 2019-02-23 MED ORDER — LACTATED RINGERS IV SOLN: 500.0000 mL | Freq: Once | INTRAVENOUS | Status: DC

## 2019-02-23 MED ORDER — FENTANYL CITRATE (PF) 100 MCG/2ML IJ SOLN
100.0000 ug | INTRAMUSCULAR | Status: DC | PRN
  Administered 2019-02-23: 100 ug via INTRAVENOUS

## 2019-02-23 MED ORDER — FAMOTIDINE IN NACL 20-0.9 MG/50ML-% IV SOLN
20.0000 mg | Freq: Once | INTRAVENOUS | Status: AC
  Administered 2019-02-23: 20 mg via INTRAVENOUS
  Filled 2019-02-23: qty 50

## 2019-02-23 MED ORDER — PHENYLEPHRINE 40 MCG/ML (10ML) SYRINGE FOR IV PUSH (FOR BLOOD PRESSURE SUPPORT): 80.0000 ug | PREFILLED_SYRINGE | INTRAVENOUS | Status: DC | PRN

## 2019-02-23 MED ORDER — DIPHENHYDRAMINE HCL 50 MG/ML IJ SOLN: 12.5000 mg | INTRAMUSCULAR | Status: DC | PRN

## 2019-02-23 MED ORDER — TERBUTALINE SULFATE 1 MG/ML IJ SOLN: 0.2500 mg | Freq: Once | INTRAMUSCULAR | Status: DC | PRN

## 2019-02-23 MED ORDER — FENTANYL-BUPIVACAINE-NACL 0.5-0.125-0.9 MG/250ML-% EP SOLN
12.0000 mL/h | EPIDURAL | Status: DC | PRN
  Administered 2019-02-24: 12 mL/h via EPIDURAL
  Filled 2019-02-23 (×2): qty 250

## 2019-02-23 MED ORDER — METOCLOPRAMIDE HCL 5 MG/ML IJ SOLN
10.0000 mg | Freq: Once | INTRAMUSCULAR | Status: AC
  Administered 2019-02-23: 10 mg via INTRAVENOUS
  Filled 2019-02-23: qty 2

## 2019-02-23 MED ORDER — OXYTOCIN 40 UNITS IN NORMAL SALINE INFUSION - SIMPLE MED
1.0000 m[IU]/min | INTRAVENOUS | Status: DC
  Administered 2019-02-23: 2 m[IU]/min via INTRAVENOUS
  Administered 2019-02-23: 20 m[IU]/min via INTRAVENOUS
  Administered 2019-02-24: 4 m[IU]/min via INTRAVENOUS
  Filled 2019-02-23 (×2): qty 1000

## 2019-02-23 MED ORDER — EPHEDRINE 5 MG/ML INJ: 10.0000 mg | INTRAVENOUS | Status: DC | PRN

## 2019-02-23 MED ORDER — SODIUM CHLORIDE (PF) 0.9 % IJ SOLN
INTRAMUSCULAR | Status: DC | PRN
  Administered 2019-02-23: 12 mL/h via EPIDURAL

## 2019-02-23 NOTE — Progress Notes (Signed)
Amy Church is a 33 y.o. Q8G5003 at [redacted]w[redacted]d admitted for IOL 2/2 pre-E with severe features (headache and vision changes)  Subjective: Feeling more contractions. Feeling baby move.  Objective: BP 140/89   Pulse (!) 106   Temp 98.6 F (37 C) (Oral)   Resp 18   Ht 5\' 6"  (1.676 m)   Wt 135.2 kg   LMP 04/25/2018 (Approximate)   BMI 48.10 kg/m  Total I/O In: 600.2 [I.V.:400.3; IV Piggyback:199.9] Out: 700 [Urine:700]  FHT:  FHR: 155 bpm, variability: moderate,  accelerations:  Present,  decelerations:  Absent UC:   irregular, every 2-5 minutes  SVE:   Dilation: 1 Effacement (%): Thick Station: Ballotable Exam by:: Darrol Poke, CNM  Labs: Lab Results  Component Value Date   WBC 7.8 02/22/2019   HGB 11.9 (L) 02/22/2019   HCT 34.6 (L) 02/22/2019   MCV 88.0 02/22/2019   PLT 355 02/22/2019    Assessment / Plan: Amy Church is a 33 y.o. [id number] at [redacted]w[redacted]d here for IOL 2/2 Pre-E with severe features (HA and visual changes).  1. Labor: S/p cyto x2 @0320 . Consider more cytotec, pitocin, AROM when appropriate.  2. FWB: Cat 1, EFW 3900 by [place] 02/21/19. 3. Pain: epidural as desired 4. GBS: positive in urine, allergic to PCN so on Vancomycin. 5. Contraception: BTL (consent signed 12/15/2018). 6. A2GDM- not well controlled. Most recent CBG 109. If >130, initiaite glucose-stabilizer.  Merilyn Baba DO OB Fellow, Faculty [facility] 02/23/2019, 3:23 AM

## 2019-02-23 NOTE — Anesthesia Preprocedure Evaluation (Signed)
Anesthesia Evaluation  Patient identified by MRN, date of birth, ID band Patient awake    Reviewed: Allergy & Precautions, NPO status , Patient's Chart, lab work & pertinent test results  Airway Mallampati: II  TM Distance: >3 FB Neck ROM: Full    Dental no notable dental hx.    Pulmonary neg pulmonary ROS, former smoker,    Pulmonary exam normal breath sounds clear to auscultation       Cardiovascular hypertension, negative cardio ROS Normal cardiovascular exam Rhythm:Regular Rate:Normal     Neuro/Psych negative neurological ROS  negative psych ROS   GI/Hepatic negative GI ROS, Neg liver ROS,   Endo/Other  diabetesMorbid obesity  Renal/GU negative Renal ROS  negative genitourinary   Musculoskeletal negative musculoskeletal ROS (+)   Abdominal (+) + obese,   Peds negative pediatric ROS (+)  Hematology negative hematology ROS (+)   Anesthesia Other Findings   Reproductive/Obstetrics (+) Pregnancy                             Anesthesia Physical Anesthesia Plan  ASA: III  Anesthesia Plan: Epidural   Post-op Pain Management:    Induction:   PONV Risk Score and Plan:   Airway Management Planned:   Additional Equipment:   Intra-op Plan:   Post-operative Plan:   Informed Consent:   Plan Discussed with:   Anesthesia Plan Comments:         Anesthesia Quick Evaluation

## 2019-02-23 NOTE — Progress Notes (Signed)
LABOR PROGRESS NOTE  Amandeep Hogston is a 33 y.o. [id number] at [redacted]w[redacted]d  admitted for IOL for cHTN with superimposed PreE w SF (HA/visual changes).  Subjective: Strip review  Objective: BP 135/80   Pulse 91   Temp 98.1 F (36.7 C) (Oral)   Resp 18   Ht 5\' 6"  (1.676 m)   Wt 135.2 kg   LMP 04/25/2018 (Approximate)   BMI 48.10 kg/m  or  Vitals:   02/23/19 2200 02/23/19 2230 02/23/19 2300 02/23/19 2330  BP: 104/78 116/80 131/81 135/80  Pulse: 91 95 91 91  Resp: 18 18 18 18   Temp:      TempSrc:      Weight:      Height:         Dilation: 4 Effacement (%): 70 Cervical Position: Middle Station: -2 Presentation: Vertex Exam by:: Annagrace Carr FHT: baseline rate 130, moderate varibility, +acel, -decel Toco: q2-5 min, irregular pattern, MVU's 150-180  Labs: Lab Results  Component Value Date   WBC 10.1 02/23/2019   HGB 12.3 02/23/2019   HCT 35.6 (L) 02/23/2019   MCV 87.5 02/23/2019   PLT 329 02/23/2019    Patient Active Problem List   Diagnosis Date Noted  . Poorly controlled diabetes mellitus ([place]) 02/22/2019  . Excessive fetal growth affecting management of mother in third trimester, antepartum 02/21/2019  . Unwanted fertility 02/09/2019  . GDM (gestational diabetes mellitus) 12/17/2018  . BMI 40.0-44.9, adult ([place]) 10/23/2018  . Obesity in pregnancy 10/23/2018  . GBS bacteriuria 08/28/2018  . Supervision of high risk pregnancy, antepartum 08/07/2018  . Chronic hypertension in pregnancy 08/07/2018    Assessment / Plan: 33 y.o. G4P1021 at [redacted]w[redacted]d here for IOL for cHTN with superimposed PreE w SF, hx of GDMA2, cHTN.  Labor: IUPC shows irregular pattern and inadequate MVU's. Given ruptured and inadequate will defer cervical exams until adequate pattern achieved or patient has sensation of pressure.  Fetal Wellbeing:  Cat I Pain Control:  epidural GBS: Positive, on Vanc, has received adequate treatment at this point Anticipated MOD:  SVD Contraception: BTL papers signed  12/15/2018  cHTN w SI PreE w SF: normotensive to mild range, on Mg gtt.    Prematurity: minimal benefit to betamethasone at this gestational age and particularly in setting of poorly controlled GDM.   GDMA2: on metformin 500mg  BID as an outpatient, A1c from 02/21/2019 was 6.4%. Poor control since admission, last few sugars have been 118>134>117>107>100. Comfortable with sugars <140 as no definite evidence that sugars below this value are associated with neonatal hypoglycemia but low threshold to start insulin gtt.   LGA: leopolds ~3700g but difficult due to habitus. [place] 02/21/2019 with EFW 99% and EFW 3902g. Last infant 2948g at 42wks. Has previously had shoulder precautions discussed.    Augustin Coupe, MD/MPH OB Fellow  02/23/2019, 11:52 PM

## 2019-02-23 NOTE — Progress Notes (Signed)
LABOR PROGRESS NOTE  Amy Church is a 33 y.o. B2I2035 at [redacted]w[redacted]d  admitted for IOL for cHTN with superimposed PreE w SF (HA/visual changes).  Subjective: Frustrated she can not eat  Objective: BP 137/85   Pulse 100   Temp 97.7 F (36.5 C) (Oral)   Resp 16   Ht 5\' 6"  (1.676 m)   Wt 135.2 kg   LMP 04/25/2018 (Approximate)   BMI 48.10 kg/m  or  Vitals:   02/23/19 1732 02/23/19 1802 02/23/19 1832 02/23/19 1901  BP: 129/82 130/79 120/80 137/85  Pulse: 100 89 95 100  Resp: 16 16 16 16   Temp:    97.7 F (36.5 C)  TempSrc:    Oral  Weight:      Height:         Dilation: 4 Effacement (%): 70 Cervical Position: Middle Station: -2 Presentation: Vertex Exam by:: Ayvah Caroll FHT: baseline rate 125, moderate varibility, +acel, -decel Toco: q3 min  Labs: Lab Results  Component Value Date   WBC 10.1 02/23/2019   HGB 12.3 02/23/2019   HCT 35.6 (L) 02/23/2019   MCV 87.5 02/23/2019   PLT 329 02/23/2019    Patient Active Problem List   Diagnosis Date Noted  . Poorly controlled diabetes mellitus ([place]) 02/22/2019  . Excessive fetal growth affecting management of mother in third trimester, antepartum 02/21/2019  . Unwanted fertility 02/09/2019  . GDM (gestational diabetes mellitus) 12/17/2018  . BMI 40.0-44.9, adult ([place]) 10/23/2018  . Obesity in pregnancy 10/23/2018  . GBS bacteriuria 08/28/2018  . Supervision of high risk pregnancy, antepartum 08/07/2018  . Chronic hypertension in pregnancy 08/07/2018    Assessment / Plan: 33 y.o. G4P1021 at [redacted]w[redacted]d here for IOL for cHTN with superimposed PreE w SF, hx of GDMA2, cHTN.  Labor: unchanged on exam, IUPC placed given lack of regular contractions on monitor, strangely after placement of IUPC appears to have much improved pattern though contractions do not appear adequate (~125 MVU's in past 10 minutes). Have exchanged pitocin bag, cont to uptitrate until adequate contractions are achieved.  Fetal Wellbeing:  Cat I Pain Control:   epidural GBS: Positive, on Vanc, has received adequate treatment at this point Anticipated MOD:  SVD Contraception: BTL papers signed 12/15/2018  cHTN w SI PreE w SF: normotensive to mild range, on Mg gtt. Reports headache and some nausea currently, treating with reglan and tylenol.   Prematurity: minimal benefit to betamethasone at this gestational age and particularly in setting of poorly controlled GDM.   GDMA2: on metformin 500mg  BID as an outpatient, A1c from 02/21/2019 was 6.4%. Poor control since admission, last few sugars have been 118>134>117>107. Comfortable with sugars <140 as no definite evidence that sugars below this value are associated with neonatal hypoglycemia but low threshold to start insulin gtt.  On entry to room for this exam noted to be drinking regular ginger ale. Discussed importance of controlling sugars during labor and risk of neonatal hypoglycemia with uncontrolled sugars.   LGA: leopolds ~3700g but difficult due to habitus. [place] 02/21/2019 with EFW 99% and EFW 3902g. Last infant 2948g at 42wks. Has previously had shoulder precautions discussed.    Augustin Coupe, MD/MPH OB Fellow  02/23/2019, 7:20 PM

## 2019-02-23 NOTE — Progress Notes (Signed)
LABOR PROGRESS NOTE  Athziri Freundlich is a 33 y.o. [id number] at [redacted]w[redacted]d  admitted for IOL for cHTN with superimposed PreE w SF (HA/visual changes).  Subjective: Still slightly uncomfortable with epidural hungry  Objective: BP 132/79   Pulse 99   Temp (!) 97.3 F (36.3 C) (Oral)   Resp 18   Ht 5\' 6"  (1.676 m)   Wt 135.2 kg   LMP 04/25/2018 (Approximate)   BMI 48.10 kg/m  or  Vitals:   02/23/19 1451 02/23/19 1502 02/23/19 1511 02/23/19 1532  BP: 125/75 122/72 133/78 132/79  Pulse: 96 94 98 99  Resp:  18  18  Temp:      TempSrc:      Weight:      Height:         Dilation: 4 Effacement (%): 70 Cervical Position: Middle Station: -2 Presentation: Vertex Exam by:: k fields, rn FHT: baseline rate 125, moderate varibility, +acel, -decel Toco: q4-5 min  Labs: Lab Results  Component Value Date   WBC 10.1 02/23/2019   HGB 12.3 02/23/2019   HCT 35.6 (L) 02/23/2019   MCV 87.5 02/23/2019   PLT 329 02/23/2019    Patient Active Problem List   Diagnosis Date Noted  . Poorly controlled diabetes mellitus ([place]) 02/22/2019  . Excessive fetal growth affecting management of mother in third trimester, antepartum 02/21/2019  . Unwanted fertility 02/09/2019  . GDM (gestational diabetes mellitus) 12/17/2018  . BMI 40.0-44.9, adult ([place]) 10/23/2018  . Obesity in pregnancy 10/23/2018  . GBS bacteriuria 08/28/2018  . Supervision of high risk pregnancy, antepartum 08/07/2018  . Chronic hypertension in pregnancy 08/07/2018    Assessment / Plan: 33 y.o. G4P1021 at [redacted]w[redacted]d here for IOL for cHTN with superimposed PreE w SF, hx of GDMA2, cHTN.  Labor: has remained 4 cm since ~0500, and has been on pitocin since 0735, with only minimal effacement. AROM for clear at this exam, hopefully will start to progress, will monitor labor curve closely.  Fetal Wellbeing:  Cat I Pain Control:  epidural GBS: Positive, on Vanc, has received adequate treatment at this point Anticipated MOD:   SVD Contraception: BTL papers signed 12/15/2018  cHTN w SI PreE w SF: normotensive to mild range, on Mg gtt.   Prematurity: minimal benefit to betamethasone at this gestational age and particularly in setting of poorly controlled GDM.   GDMA2: on metformin 500mg  BID as an outpatient, A1c from 02/21/2019 was 6.4%. Poor control since admission, last few sugars have been 118>134>117. Comfortable with sugars <140 as no definite evidence that sugars below this value are associated with neonatal hypoglycemia but low threshold to start insulin gtt.   LGA: leopolds ~3700g but difficult due to habitus. [place] 02/21/2019 with EFW 99% and EFW 3902g. Last infant 2948g at 42wks. Has previously had shoulder precautions discussed.     Augustin Coupe, MD/MPH OB Fellow  02/23/2019, 3:54 PM

## 2019-02-23 NOTE — Progress Notes (Signed)
Amy Church is a 33 y.o. [id number] at [redacted]w[redacted]d admitted for  IOL 2/2 Pre-E with severe features (HA and visual changes).  Subjective: Comfortable, not really feeling contractions. Feeling her baby move.  Objective: BP 128/74   Pulse (!) 115   Temp 98.6 F (37 C) (Oral)   Resp 18   Ht 5\' 6"  (1.676 m)   Wt 135.2 kg   LMP 04/25/2018 (Approximate)   BMI 48.10 kg/m  No intake/output data recorded.  FHT:  FHR: 150 bpm, variability: moderate,  accelerations:  Present,  decelerations:  Absent UC:   Uterine irritability  SVE:   Dilation: 4 Effacement (%): 50 Station: -3 Exam by:: Dr. Darene Lamer  Labs: Lab Results  Component Value Date   WBC 9.9 02/23/2019   HGB 11.6 (L) 02/23/2019   HCT 34.7 (L) 02/23/2019   MCV 89.7 02/23/2019   PLT 336 02/23/2019    Assessment / Plan: Amy Collinsis a 35 y.V.[id number] at [redacted]w[redacted]d here for IOL 2/2 Pre-E with severe features (HA and visual changes).  1. Labor:S/p cyto x2 @0320 . FB out at 0536. Starting pitocin. Consider AROM when appropriate. 2. FWB:Cat 1, EFW 3900 by [place] 02/21/19. 3. Pain:IV pain meds, epidural as desired 4. BPZ:WCHENIDP in urine, allergic to PCN so on Vancomycin. 5. Contraception: BTL (consent signed 12/15/2018). 6. A2GDM- not well controlled. Most recent CBG 135. If one more CBG >130, initiaite glucose-stabilizer. 7. Pre-E w/ SF (HA and visual changes). On mag. No HA or visual changes. BPs normotensive.  Amy Baba DO OB Fellow, Faculty Practice 02/23/2019, 7:30 AM

## 2019-02-23 NOTE — Anesthesia Procedure Notes (Signed)
Epidural Patient location during procedure: OB Start time: 02/23/2019 2:02 PM End time: 02/23/2019 2:21 PM  Staffing Anesthesiologist: Lynda Rainwater, MD Performed: anesthesiologist   Preanesthetic Checklist Completed: patient identified, IV checked, site marked, risks and benefits discussed, surgical consent, monitors and equipment checked, pre-op evaluation and timeout performed  Epidural Patient position: sitting Prep: ChloraPrep Patient monitoring: heart rate, cardiac monitor, continuous pulse ox and blood pressure Approach: midline Location: L2-L3 Injection technique: LOR saline  Needle:  Needle type: Tuohy  Needle gauge: 17 G Needle length: 9 cm Needle insertion depth: 9 cm Catheter type: closed end flexible Catheter size: 20 Guage Catheter at skin depth: 18 cm Test dose: negative  Assessment Events: blood not aspirated, injection not painful, no injection resistance, no paresthesia and negative IV test  Additional Notes Reason for block:procedure for pain

## 2019-02-24 ENCOUNTER — Other Ambulatory Visit: Payer: Self-pay | Admitting: Student

## 2019-02-24 ENCOUNTER — Other Ambulatory Visit (HOSPITAL_COMMUNITY): Payer: No Typology Code available for payment source

## 2019-02-24 ENCOUNTER — Encounter (HOSPITAL_COMMUNITY): Payer: Self-pay | Admitting: Obstetrics and Gynecology

## 2019-02-24 DIAGNOSIS — Z8759 Personal history of other complications of pregnancy, childbirth and the puerperium: Secondary | ICD-10-CM | POA: Diagnosis present

## 2019-02-24 DIAGNOSIS — O24429 Gestational diabetes mellitus in childbirth, unspecified control: Secondary | ICD-10-CM

## 2019-02-24 DIAGNOSIS — O1414 Severe pre-eclampsia complicating childbirth: Secondary | ICD-10-CM

## 2019-02-24 DIAGNOSIS — Z3A36 36 weeks gestation of pregnancy: Secondary | ICD-10-CM

## 2019-02-24 DIAGNOSIS — O99824 Streptococcus B carrier state complicating childbirth: Secondary | ICD-10-CM

## 2019-02-24 DIAGNOSIS — O164 Unspecified maternal hypertension, complicating childbirth: Secondary | ICD-10-CM

## 2019-02-24 LAB — COMPREHENSIVE METABOLIC PANEL
ALT: 24 U/L (ref 0–44)
AST: 39 U/L (ref 15–41)
Albumin: 2.4 g/dL — ABNORMAL LOW (ref 3.5–5.0)
Alkaline Phosphatase: 92 U/L (ref 38–126)
Anion gap: 9 (ref 5–15)
BUN: <5 mg/dL — ABNORMAL LOW (ref 6–20)
CO2: 19 mmol/L — ABNORMAL LOW (ref 22–32)
Calcium: 6.3 mg/dL — CL (ref 8.9–10.3)
Chloride: 103 mmol/L (ref 98–111)
Creatinine, Ser: 0.68 mg/dL (ref 0.44–1.00)
GFR calc Af Amer: >60 mL/min (ref >60)
GFR calc non Af Amer: >60 mL/min (ref >60)
Glucose, Bld: 125 mg/dL — ABNORMAL HIGH (ref 70–99)
Potassium: 3.1 mmol/L — ABNORMAL LOW (ref 3.5–5.1)
Sodium: 131 mmol/L — ABNORMAL LOW (ref 135–145)
Total Bilirubin: 0.4 mg/dL (ref 0.3–1.2)
Total Protein: 5.7 g/dL — ABNORMAL LOW (ref 6.5–8.1)

## 2019-02-24 LAB — CBC
HCT: 34.1 % — ABNORMAL LOW (ref 36.0–46.0)
HCT: 32.4 % — ABNORMAL LOW (ref 36.0–46.0)
Hemoglobin: 11.5 g/dL — ABNORMAL LOW (ref 12.0–15.0)
Hemoglobin: 11 g/dL — ABNORMAL LOW (ref 12.0–15.0)
MCH: 30.3 pg (ref 26.0–34.0)
MCH: 30.1 pg (ref 26.0–34.0)
MCHC: 33.7 g/dL (ref 30.0–36.0)
MCHC: 34 g/dL (ref 30.0–36.0)
MCV: 90 fL (ref 80.0–100.0)
MCV: 88.5 fL (ref 80.0–100.0)
Platelets: 291 10*3/uL (ref 150–400)
Platelets: 296 10*3/uL (ref 150–400)
RBC: 3.79 MIL/uL — ABNORMAL LOW (ref 3.87–5.11)
RBC: 3.66 MIL/uL — ABNORMAL LOW (ref 3.87–5.11)
RDW: 13.4 % (ref 11.5–15.5)
RDW: 13.1 % (ref 11.5–15.5)
WBC: 12 10*3/uL — ABNORMAL HIGH (ref 4.0–10.5)
WBC: 15.1 10*3/uL — ABNORMAL HIGH (ref 4.0–10.5)
nRBC: 0 % (ref 0.0–0.2)
nRBC: 0 % (ref 0.0–0.2)

## 2019-02-24 LAB — GLUCOSE, CAPILLARY
Glucose-Capillary: 122 mg/dL — ABNORMAL HIGH (ref 70–99)
Glucose-Capillary: 106 mg/dL — ABNORMAL HIGH (ref 70–99)
Glucose-Capillary: 115 mg/dL — ABNORMAL HIGH (ref 70–99)
Glucose-Capillary: 113 mg/dL — ABNORMAL HIGH (ref 70–99)
Glucose-Capillary: 182 mg/dL — ABNORMAL HIGH (ref 70–99)

## 2019-02-24 LAB — MAGNESIUM: Magnesium: 7.9 mg/dL (ref 1.7–2.4)

## 2019-02-24 MED ORDER — LACTATED RINGERS AMNIOINFUSION
INTRAVENOUS | Status: DC
  Filled 2019-02-24 (×3): qty 1000

## 2019-02-24 MED ORDER — MAGNESIUM SULFATE 40 GM/1000ML IV SOLN
1.0000 g/h | INTRAVENOUS | Status: AC
  Administered 2019-02-24: 2 g/h via INTRAVENOUS
  Filled 2019-02-24: qty 1000

## 2019-02-24 MED ORDER — LACTATED RINGERS IV SOLN: INTRAVENOUS | Status: DC

## 2019-02-24 MED ORDER — SENNOSIDES-DOCUSATE SODIUM 8.6-50 MG PO TABS
2.0000 | ORAL_TABLET | ORAL | Status: DC
  Administered 2019-02-24 – 2019-02-26 (×2): 2 via ORAL
  Filled 2019-02-24 (×2): qty 2

## 2019-02-24 MED ORDER — ONDANSETRON HCL 4 MG PO TABS: 4.0000 mg | ORAL_TABLET | ORAL | Status: DC | PRN

## 2019-02-24 MED ORDER — DIBUCAINE (PERIANAL) 1 % EX OINT: 1.0000 "application " | TOPICAL_OINTMENT | CUTANEOUS | Status: DC | PRN

## 2019-02-24 MED ORDER — WITCH HAZEL-GLYCERIN EX PADS: 1.0000 "application " | MEDICATED_PAD | CUTANEOUS | Status: DC | PRN

## 2019-02-24 MED ORDER — ZOLPIDEM TARTRATE 5 MG PO TABS: 5.0000 mg | ORAL_TABLET | Freq: Every evening | ORAL | Status: DC | PRN

## 2019-02-24 MED ORDER — OXYTOCIN 40 UNITS IN NORMAL SALINE INFUSION - SIMPLE MED: 1.0000 m[IU]/min | INTRAVENOUS | Status: DC

## 2019-02-24 MED ORDER — PRENATAL MULTIVITAMIN CH
1.0000 | ORAL_TABLET | Freq: Every day | ORAL | Status: DC
  Administered 2019-02-25 – 2019-02-26 (×2): 1 via ORAL
  Filled 2019-02-24 (×2): qty 1

## 2019-02-24 MED ORDER — COCONUT OIL OIL: 1.0000 "application " | TOPICAL_OIL | Status: DC | PRN

## 2019-02-24 MED ORDER — BUTALBITAL-APAP-CAFFEINE 50-325-40 MG PO TABS: 2.0000 | ORAL_TABLET | ORAL | Status: DC | PRN

## 2019-02-24 MED ORDER — OXYCODONE HCL 5 MG PO TABS
5.0000 mg | ORAL_TABLET | Freq: Once | ORAL | Status: AC
  Administered 2019-02-24: 5 mg via ORAL
  Filled 2019-02-24: qty 1

## 2019-02-24 MED ORDER — BENZOCAINE-MENTHOL 20-0.5 % EX AERO: 1.0000 "application " | INHALATION_SPRAY | CUTANEOUS | Status: DC | PRN

## 2019-02-24 MED ORDER — SIMETHICONE 80 MG PO CHEW: 80.0000 mg | CHEWABLE_TABLET | ORAL | Status: DC | PRN

## 2019-02-24 MED ORDER — IBUPROFEN 600 MG PO TABS
600.0000 mg | ORAL_TABLET | Freq: Four times a day (QID) | ORAL | Status: DC
  Administered 2019-02-24 – 2019-02-26 (×8): 600 mg via ORAL
  Filled 2019-02-24 (×8): qty 1

## 2019-02-24 MED ORDER — TETANUS-DIPHTH-ACELL PERTUSSIS 5-2.5-18.5 LF-MCG/0.5 IM SUSP: 0.5000 mL | Freq: Once | INTRAMUSCULAR | Status: DC

## 2019-02-24 MED ORDER — ONDANSETRON HCL 4 MG/2ML IJ SOLN: 4.0000 mg | INTRAMUSCULAR | Status: DC | PRN

## 2019-02-24 MED ORDER — DIPHENHYDRAMINE HCL 25 MG PO CAPS: 25.0000 mg | ORAL_CAPSULE | Freq: Four times a day (QID) | ORAL | Status: DC | PRN

## 2019-02-24 MED ORDER — MISOPROSTOL 200 MCG PO TABS: 600.0000 ug | ORAL_TABLET | Freq: Once | ORAL | Status: AC

## 2019-02-24 MED ORDER — MISOPROSTOL 200 MCG PO TABS
ORAL_TABLET | ORAL | Status: AC
  Administered 2019-02-24: 600 ug via ORAL
  Filled 2019-02-24: qty 3

## 2019-02-24 MED ORDER — ACETAMINOPHEN 325 MG PO TABS
650.0000 mg | ORAL_TABLET | ORAL | Status: DC | PRN
  Administered 2019-02-25 – 2019-02-26 (×2): 650 mg via ORAL
  Filled 2019-02-24 (×2): qty 2

## 2019-02-24 NOTE — Progress Notes (Signed)
CRITICAL VALUE STICKER  CRITICAL VALUE: 7.9 mag level  RECEIVER (on-site recipient of call): Elza Rafter  DATE & TIME NOTIFIED: 02/24/19 at 69  MESSENGER (representative from lab): Vennie Homans  MD NOTIFIED: Ardean Larsen [place]: [id number]  RESPONSE: no new orders at this time

## 2019-02-24 NOTE — Progress Notes (Signed)
Called by nurse to see patient. Feeling funny on mag. Wants it off. Discussed that magnesium is used to treat preeclampsia with severe features and to help prevent eclampsia. Patient compromised and would like it cut down to 1g/hr. Order changed.  Truett Mainland, DO 02/24/2019 4:24 PM

## 2019-02-24 NOTE — Progress Notes (Signed)
Called by RN to bedside as patient is reporting seeing double vision.  Patient had an NSVD at 1240 (three hours ago). Her pregnancy was complicated by [facility] with super imposed pre-e, GDM on metformin. Intrapartum, she received MgSo4. Postpartum she received 600 mcg of cytotec for PPH.   Upon arrival in the room, patient was alert and oriented times 3; interacting with staff appropriately. DTRs are normal; HA is an 8/10. Upon questioning, patient reports that her double vision is probably related to the Coke just had. She also relates that the double vision is only when "looking at people" but not when looking at objects. She denies RUQ pain, floating spots.   -last BP was 137/92; blood glucose was 189.  -Will give Fioricet for HA; continue to monitor for other concerning signs while on PP.   Maye Hides

## 2019-02-24 NOTE — Progress Notes (Signed)
Dr. Nehemiah Settle notified in regards to patient wanting her Magnesium turned off. Pt states that she "has had enough" of the medicine and it is "interfering with her caring for her baby". Dr. Nehemiah Settle will come to the bedside to talk to the patient. Toya Smothers RN

## 2019-02-24 NOTE — Progress Notes (Signed)
Called by nurse to see patient. Feeling funny on mag. Wants it off. Discussed that magnesium is used to treat preeclampsia with severe features and to help prevent eclampsia. Patient compromised and would like it cut down to 1g/hr. Discussed that this may not adequately cover her, but would change the order.  Truett Mainland, DO 02/24/2019 4:24 PM

## 2019-02-24 NOTE — Discharge Summary (Signed)
Postpartum Discharge Summary      Patient Name: Amy Church DOB: 03-24-1985 MRN: [id number]  Date of admission: 02/22/2019 Delivering Provider: Jonnie Kind   Date of discharge: 02/26/2019  Admitting diagnosis: Poorly controlled diabetes mellitus ([place]) [E11.65] Intrauterine pregnancy: [redacted]w[redacted]d    Secondary diagnosis:  Principal Problem:   Severe preeclampsia Active Problems:   Supervision of high risk pregnancy, antepartum   Chronic hypertension in pregnancy   GBS bacteriuria   Obesity in pregnancy   GDM (gestational diabetes mellitus)   Unwanted fertility   Excessive fetal growth affecting management of mother in third trimester, antepartum   Poorly controlled diabetes mellitus ([place](postpartum hemorrhage)  Additional problems:      Discharge diagnosis: Term Pregnancy Delivered                                                                                                Post partum procedures:Anti-hypertensives  Augmentation: AROM, Pitocin, Cytotec and Foley Balloon  Complications: None  Hospital course:  Induction of Labor With Vaginal Delivery   33y.o. yo [id number] 321w6das admitted to the hospital 02/22/2019 for induction of labor after reporting to MAU on 02/22/2019 with HA and visual changes, although BP was not elevated in MAU. She was given cytotec, FB, and pitocin and placed on a MgSo4 drip. She delivered at 1240 on 12/26 after Vaccuum-assisted descent and then NSVD. Patient had [place] (665 EBL), and was given 600 mcg of cytotec orally. 2nd degree and perineal laceration repaired.  Indication for induction: Preeclampsia with severe features; HA and visual changes.  Patient had an uncomplicated labor course as follows: Membrane Rupture Time/Date: 3:41 PM ,02/23/2019   Intrapartum Procedures: Episiotomy: None [1]                                         Lacerations:  2nd degree [3];Perineal [11]  Patient had delivery of a Viable infant.   Information for the patient's newborn:  CoDarneshia, Demary0[[id number]]Delivery Method: Vag-Vacuum    02/24/2019  Details of delivery can be found in separate delivery note.  Patient had a routine postpartum course. Underwent BTL PPD#1. She was started on pre-pregnancy antihypertensive of norvasc 5 mg on day of discharge. She is doing well, has some edema but no evidence of DVT. Some headache but not drinking much fluids or sleeping, encouraged PO intake. She will room in with infant overnight. Patient is discharged home 02/26/19.  Meds sent via meds to beds. BP check in one week.  Delivery time: 12:40 PM    Magnesium Sulfate received: Yes BMZ received: No Rhophylac:No MMR:No Transfusion:No  Physical exam  Vitals:   02/25/19 1952 02/25/19 2306 02/26/19 0423 02/26/19 0734  BP: 133/76 140/80 (!) 146/101 122/67  Pulse: 75 76 84 61  Resp: _0 Temp: 97.7 F (36.5 C) 97.6 F (36.4 C) 97.7 F (36.5 C) 98 F (36.7 C)  TempSrc: Oral Oral Oral Oral  SpO2: 97% 98% 98% 100%  Weight:      Height:       General: alert, cooperative and no distress Lochia: appropriate Uterine Fundus: unable to palpate 2/2 habitus Incision: Healing well with no significant drainage DVT Evaluation: No evidence of DVT seen on physical exam. Labs: Lab Results  Component Value Date   WBC 15.1 (H) 02/24/2019   HGB 11.0 (L) 02/24/2019   HCT 32.4 (L) 02/24/2019   MCV 88.5 02/24/2019   PLT 296 02/24/2019   CMP Latest Ref Rng & Units 02/25/2019  Glucose 70 - 99 mg/dL 108(H)  BUN 6 - 20 mg/dL 7  Creatinine 0.44 - 1.00 mg/dL 0.71  Sodium 135 - 145 mmol/L 133(L)  Potassium 3.5 - 5.1 mmol/L 3.2(L)  Chloride 98 - 111 mmol/L 104  CO2 22 - 32 mmol/L 22  Calcium 8.9 - 10.3 mg/dL 6.2(LL)  Total Protein 6.5 - 8.1 g/dL 4.4(L)  Total Bilirubin 0.3 - 1.2 mg/dL 0.4  Alkaline Phos 38 - 126 U/L 81  AST 15 - 41 U/L 27  ALT 0 - 44 U/L 20    Discharge instruction: per After Visit Summary and "Baby and  Me Booklet".  After visit meds:  Allergies as of 02/26/2019      Reactions   Penicillins Hives, Shortness Of Breath, Swelling   Has patient had a PCN reaction causing immediate rash, facial/tongue/throat swelling, SOB or lightheadedness with hypotension: yes Has patient had a PCN reaction causing severe rash involving mucus membranes or skin necrosis: no Has patient had a PCN reaction that required hospitalization yes Has patient had a PCN reaction occurring within the last 10 years: yes If all of the above answers are "NO", then may proceed with Cephalosporin use.      Medication List    STOP taking these medications   aspirin 81 MG chewable tablet   cyclobenzaprine 5 MG tablet Commonly known as: FLEXERIL   metFORMIN 500 MG tablet Commonly known as: Glucophage   NIFEdipine 30 MG 24 hr tablet Commonly known as: PROCARDIA-XL/NIFEDICAL-XL     TAKE these medications   AMBULATORY NON FORMULARY MEDICATION Blood pressure cuff/Monitored regularly at home.  ICD 10: ZO09.90   amLODipine 5 MG tablet Commonly known as: NORVASC Take 1 tablet (5 mg total) by mouth daily.   ibuprofen 600 MG tablet Commonly known as: ADVIL Take 1 tablet (600 mg total) by mouth every 6 (six) hours.   prenatal vitamin w/FE, FA 27-1 MG Tabs tablet Take 1 tablet by mouth daily at 12 noon.       Diet: carb modified diet  Activity: Advance as tolerated. Pelvic rest for 6 weeks.   Outpatient follow up:1 week for BP check Follow up Appt: Future Appointments  Date Time Provider Devol  03/06/2019 11:40 AM Blakely Wildwood [facility]   Follow up Visit: [place] for [facility]. Schedule an appointment as soon as possible for a visit in 6 day(s).   Specialty: Obstetrics and Gynecology Contact information: [address] [place], [place] [id number] [place] [address] [phone]          Message sent to [place]  cl[facility]:   Please schedule this patient for Postpartum visit in: 4 weeks with the following provider: MD For C/S patients schedule nurse incision check in weeks 2 weeks: no High risk pregnancy complicated by: GDM on metformin and CHTN not on any medication Delivery mode:  Vaccuum assisted descent and then delivered  with spontaneous maternal effort.  Anticipated Birth Control:  planning BTL in hospital; currently scheduled for 12/27 in the AM. Will need BC counseling if she does not get BTL in hospital.  PP Procedures needed: BP check in one week and 2 hour GTT at her postpartum visit  Schedule Integrated BH visit: no   Newborn Data: Live born female  Birth Weight:   APGAR: ,   Newborn Delivery   Birth date/time: 02/24/2019 12:40:00 Delivery type: Vaginal, Vacuum (Extractor)      Baby Feeding: Bottle Disposition:rooming in   02/26/2019 Sloan Leiter, MD

## 2019-02-24 NOTE — Progress Notes (Signed)
Notified by RN of two elevated temps 30 min apart. Currently meets temperature criteria for chorio however does not meet any other criteria (fetal tachycardia, purulent discharge, most recent CBC with normal WBC but will recheck). DDx includes epidural fever. Will send repeat CBC and recheck temp in 2 hours.

## 2019-02-24 NOTE — Progress Notes (Signed)
   Amy Church is a 33 y.o. [id number] at [redacted]w[redacted]d  admitted for induction of labor due to pre-e with severe features.   Subjective: Feeling pressure between contractions, no signs of pre-e.   Objective: Vitals:   02/24/19 0702 02/24/19 0732 02/24/19 0802 02/24/19 0832  BP: (!) 141/43 (!) 152/92 (!) 149/104 116/77  Pulse: (!) 146 92 98 99  Resp: 18 18 16 14   Temp:  (!) 100.5 F (38.1 C)    TempSrc:  Axillary    Weight:      Height:       No intake/output data recorded.  FHT:  FHR: 140 bpm, variability: moderate,  accelerations:  Abscent,  decelerations:  Present occasional  non-repetitive variables UC:   irregular, every 4 minutes SVE:   Dilation: Lip/rim Effacement (%): 90 Station: Plus 1 Exam by:: Ignacia Felling, RN Pitocin @ 75mu/min  Labs: Lab Results  Component Value Date   WBC 12.0 (H) 02/24/2019   HGB 11.5 (L) 02/24/2019   HCT 34.1 (L) 02/24/2019   MCV 90.0 02/24/2019   PLT 291 02/24/2019    Assessment / Plan: --BP have been occasional elevated but none severe range; stable pre-e with no symptoms at this time.  -RN noted presence of cervix , patient not yet 10 cms dilated. Reports now at 0/+1 station.  -Discussed in rounds with Dr. Glo Herring, who is aware of plan to continue to labor management; anticipate NSVD  Labor: Close to beginning 2nd stage; repositioned on her right side.  Fetal Wellbeing:  Category II; repositioned on right side Pain Control:  Epidural Anticipated MOD:  NSVD  [name] Beverly Suriano 02/24/2019, 8:52 AM

## 2019-02-24 NOTE — Progress Notes (Signed)
LABOR PROGRESS NOTE  Amy Church is a 33 y.o. H6K0881 at [redacted]w[redacted]d  admitted for IOL for cHTN with superimposed PreE w SF (HA/visual changes).  Subjective: Uncomfortable in L leg, wondering if epidural is not even  Objective: BP 120/80   Pulse (!) 111   Temp 98.1 F (36.7 C) (Oral)   Resp 18   Ht 5\' 6"  (1.676 m)   Wt 135.2 kg   LMP 04/25/2018 (Approximate)   BMI 48.10 kg/m  or  Vitals:   02/24/19 0005 02/24/19 0030 02/24/19 0100 02/24/19 0133  BP: 100/62 122/84 106/64 120/80  Pulse: 96 95 94 (!) 111  Resp: 18 18 18 18   Temp:      TempSrc:      Weight:      Height:         Dilation: 4.5 Effacement (%): 90 Cervical Position: Middle Station: -2 Presentation: Vertex Exam by:: Dr Dione Plover  FHT: baseline rate 155, moderate varibility, +acel, early and variable decel Toco: q2-5 min, irregular pattern, MVU's 150-190  Labs: Lab Results  Component Value Date   WBC 10.1 02/23/2019   HGB 12.3 02/23/2019   HCT 35.6 (L) 02/23/2019   MCV 87.5 02/23/2019   PLT 329 02/23/2019    Patient Active Problem List   Diagnosis Date Noted  . Poorly controlled diabetes mellitus ([place]) 02/22/2019  . Excessive fetal growth affecting management of mother in third trimester, antepartum 02/21/2019  . Unwanted fertility 02/09/2019  . GDM (gestational diabetes mellitus) 12/17/2018  . BMI 40.0-44.9, adult ([place]) 10/23/2018  . Obesity in pregnancy 10/23/2018  . GBS bacteriuria 08/28/2018  . Supervision of high risk pregnancy, antepartum 08/07/2018  . Chronic hypertension in pregnancy 08/07/2018    Assessment / Plan: 33 y.o. G4P1021 at [redacted]w[redacted]d here for IOL for cHTN with superimposed PreE w SF, hx of GDMA2, cHTN.  Labor: s/p miso x2 and foley bulb, on pitocin since 12/25 @ 0735. IUPC placed around 1900 continues to show irregular pattern and inadequate MVU's. Small but definite change on my exam from 4/70/-2 > 4.5/90/-2. Currently at 30u pitocin.   Fetal Wellbeing:  Cat II for variables but  overall reassuring with moderate variability and accels Pain Control:  epidural GBS: Positive, on Vanc, has received adequate treatment at this point Anticipated MOD:  SVD Contraception: BTL papers signed 12/15/2018  cHTN w SI PreE w SF: normotensive to mild range, on Mg gtt.    Prematurity: minimal benefit to betamethasone at this gestational age and particularly in setting of poorly controlled GDM.   GDMA2: on metformin 500mg  BID as an outpatient, A1c from 02/21/2019 was 6.4%. Poor control since admission, last few sugars have been 118>134>117>107>100>122. Comfortable with sugars <140 as no definite evidence that sugars below this value are associated with neonatal hypoglycemia but low threshold to start insulin gtt.   LGA: leopolds ~3700g but difficult due to habitus. [place] 02/21/2019 with EFW 99% and EFW 3902g. Last infant 2948g at 42wks. Has previously had shoulder precautions discussed.    Augustin Coupe, MD/MP[facility] OB Fellow  02/24/2019, 1:59 AM

## 2019-02-24 NOTE — Progress Notes (Signed)
   Amy Church is a 33 y.o. [id number] at [redacted]w[redacted]d  admitted for pre-e with severe features.   Subjective: Patient resting in bed, complaining of a HA and feeling weak. Mg level drawn and blood glucose is 113.   Objective: Vitals:   02/24/19 1043 02/24/19 1101 02/24/19 1132 02/24/19 1201  BP:  120/72 (!) 109/58 119/80  Pulse:  97 (!) 106 89  Resp:  18 16   Temp:      TempSrc:      Weight: 136.1 kg     Height: 5\' 6"  (1.676 m)      Total I/O In: 1310.2 [P.O.:600; I.V.:710.2] Out: 275 [Urine:275]  FHT:  FHR: 135 bpm, variability: minimal ,  accelerations:  Present,  decelerations:  Absent UC:   irregular, every 1-2 minutes SVE:   Dilation: 10 Effacement (%): 90 Station: 0, Plus 1 Exam by:: Ignacia Felling, RN Pitocin @ 34 mu/min  Labs: Lab Results  Component Value Date   WBC 12.0 (H) 02/24/2019   HGB 11.5 (L) 02/24/2019   HCT 34.1 (L) 02/24/2019   MCV 90.0 02/24/2019   PLT 291 02/24/2019    Assessment / Plan: Patient's tracing now with late decelerations. Dr. Glo Herring is at the bedside to evaluate patient; patient being repositioned.  SVE and FSE by Dr. Glo Herring.   Labor: Anticipate SVD, Dr. Glo Herring preparing for [facility] delivery.  Fetal Wellbeing:  Category II Pain Control:  Epidural Anticipated MOD:  NSVD  [name] [facility] 02/24/2019, 12:05 PM

## 2019-02-25 ENCOUNTER — Encounter (HOSPITAL_COMMUNITY): Payer: Self-pay | Admitting: Anesthesiology

## 2019-02-25 ENCOUNTER — Inpatient Hospital Stay (HOSPITAL_COMMUNITY): Payer: Medicaid Other | Admitting: Anesthesiology

## 2019-02-25 ENCOUNTER — Inpatient Hospital Stay (HOSPITAL_COMMUNITY): Payer: Medicaid Other

## 2019-02-25 ENCOUNTER — Encounter (HOSPITAL_COMMUNITY)
Admission: AD | Disposition: A | Discharge status: Home / Self Care | Payer: Self-pay | Source: Home / Self Care | Attending: Obstetrics and Gynecology

## 2019-02-25 ENCOUNTER — Inpatient Hospital Stay (HOSPITAL_COMMUNITY)
Admission: AD | Admit: 2019-02-25 | Payer: Medicaid Other | Source: Home / Self Care | Admitting: Obstetrics and Gynecology

## 2019-02-25 ENCOUNTER — Encounter (HOSPITAL_COMMUNITY): Payer: Self-pay | Admitting: Obstetrics and Gynecology

## 2019-02-25 DIAGNOSIS — O1413 Severe pre-eclampsia, third trimester: Secondary | ICD-10-CM | POA: Diagnosis not present

## 2019-02-25 DIAGNOSIS — Z302 Encounter for sterilization: Secondary | ICD-10-CM

## 2019-02-25 HISTORY — PX: TUBAL LIGATION: SHX77

## 2019-02-25 LAB — COMPREHENSIVE METABOLIC PANEL
ALT: 20 U/L (ref 0–44)
AST: 27 U/L (ref 15–41)
Albumin: 1.9 g/dL — ABNORMAL LOW (ref 3.5–5.0)
Alkaline Phosphatase: 81 U/L (ref 38–126)
Anion gap: 7 (ref 5–15)
BUN: 7 mg/dL (ref 6–20)
CO2: 22 mmol/L (ref 22–32)
Calcium: 6.2 mg/dL — CL (ref 8.9–10.3)
Chloride: 104 mmol/L (ref 98–111)
Creatinine, Ser: 0.71 mg/dL (ref 0.44–1.00)
GFR calc Af Amer: >60 mL/min (ref >60)
GFR calc non Af Amer: >60 mL/min (ref >60)
Glucose, Bld: 108 mg/dL — ABNORMAL HIGH (ref 70–99)
Potassium: 3.2 mmol/L — ABNORMAL LOW (ref 3.5–5.1)
Sodium: 133 mmol/L — ABNORMAL LOW (ref 135–145)
Total Bilirubin: 0.4 mg/dL (ref 0.3–1.2)
Total Protein: 4.4 g/dL — ABNORMAL LOW (ref 6.5–8.1)

## 2019-02-25 SURGERY — LIGATION, FALLOPIAN TUBE, POSTPARTUM
Anesthesia: Spinal | Laterality: Bilateral

## 2019-02-25 MED ORDER — BUPIVACAINE HCL (PF) 0.75 % IJ SOLN
INTRAMUSCULAR | Status: DC | PRN
  Administered 2019-02-25: 1.6 mL via INTRATHECAL

## 2019-02-25 MED ORDER — DEXAMETHASONE SODIUM PHOSPHATE 10 MG/ML IJ SOLN
INTRAMUSCULAR | Status: DC | PRN
  Administered 2019-02-25: 10 mg via INTRAVENOUS

## 2019-02-25 MED ORDER — LIDOCAINE HCL (CARDIAC) PF 100 MG/5ML IV SOSY
PREFILLED_SYRINGE | INTRAVENOUS | Status: DC | PRN
  Administered 2019-02-25: 100 mg via INTRAVENOUS

## 2019-02-25 MED ORDER — KETOROLAC TROMETHAMINE 30 MG/ML IJ SOLN
30.0000 mg | Freq: Once | INTRAMUSCULAR | Status: AC | PRN
  Administered 2019-02-25: 30 mg via INTRAVENOUS

## 2019-02-25 MED ORDER — LACTATED RINGERS IV SOLN: INTRAVENOUS | Status: DC | PRN

## 2019-02-25 MED ORDER — KETOROLAC TROMETHAMINE 30 MG/ML IJ SOLN
INTRAMUSCULAR | Status: AC
  Filled 2019-02-25: qty 1

## 2019-02-25 MED ORDER — MAGNESIUM SULFATE 40 GM/1000ML IV SOLN
INTRAVENOUS | Status: DC | PRN
  Administered 2019-02-25 (×2): 1 g/h via INTRAVENOUS

## 2019-02-25 MED ORDER — BUPIVACAINE HCL (PF) 0.25 % IJ SOLN
INTRAMUSCULAR | Status: DC | PRN
  Administered 2019-02-25: 20 mL

## 2019-02-25 MED ORDER — BUPIVACAINE HCL (PF) 0.25 % IJ SOLN
INTRAMUSCULAR | Status: AC
  Filled 2019-02-25: qty 10

## 2019-02-25 MED ORDER — MIDAZOLAM HCL 2 MG/2ML IJ SOLN
INTRAMUSCULAR | Status: DC | PRN
  Administered 2019-02-25: 2 mg via INTRAVENOUS

## 2019-02-25 MED ORDER — PHENYLEPHRINE HCL-NACL 10-0.9 MG/250ML-% IV SOLN
INTRAVENOUS | Status: DC | PRN
  Administered 2019-02-25: 60 ug/min via INTRAVENOUS

## 2019-02-25 MED ORDER — PROPOFOL 10 MG/ML IV BOLUS
INTRAVENOUS | Status: DC | PRN
  Administered 2019-02-25: 30 mg via INTRAVENOUS
  Administered 2019-02-25: 200 mg via INTRAVENOUS

## 2019-02-25 MED ORDER — SUCCINYLCHOLINE CHLORIDE 20 MG/ML IJ SOLN
INTRAMUSCULAR | Status: DC | PRN
  Administered 2019-02-25: 160 mg via INTRAVENOUS

## 2019-02-25 MED ORDER — OXYCODONE HCL 5 MG/5ML PO SOLN: 5.0000 mg | Freq: Once | ORAL | Status: DC | PRN

## 2019-02-25 MED ORDER — HYDROMORPHONE HCL 1 MG/ML IJ SOLN: 0.2500 mg | INTRAMUSCULAR | Status: DC | PRN

## 2019-02-25 MED ORDER — OXYCODONE HCL 5 MG PO TABS: 5.0000 mg | ORAL_TABLET | Freq: Once | ORAL | Status: DC | PRN

## 2019-02-25 MED ORDER — PROMETHAZINE HCL 25 MG/ML IJ SOLN: 6.2500 mg | INTRAMUSCULAR | Status: DC | PRN

## 2019-02-25 MED ORDER — FENTANYL CITRATE (PF) 100 MCG/2ML IJ SOLN
INTRAMUSCULAR | Status: DC | PRN
  Administered 2019-02-25 (×2): 100 ug via INTRAVENOUS

## 2019-02-25 MED ORDER — ONDANSETRON HCL 4 MG/2ML IJ SOLN
INTRAMUSCULAR | Status: DC | PRN
  Administered 2019-02-25: 4 mg via INTRAVENOUS

## 2019-02-25 SURGICAL SUPPLY — 25 items
BLADE SURG 11 STRL SS (BLADE) ×3 IMPLANT
CLOSURE WOUND 1/2 X4 (GAUZE/BANDAGES/DRESSINGS) ×1
DRSG OPSITE POSTOP 3X4 (GAUZE/BANDAGES/DRESSINGS) ×3 IMPLANT
DURAPREP 26ML APPLICATOR (WOUND CARE) ×3 IMPLANT
GLOVE BIOGEL PI IND STRL 7.0 (GLOVE) ×1 IMPLANT
GLOVE BIOGEL PI IND STRL 7.5 (GLOVE) ×1 IMPLANT
GLOVE BIOGEL PI INDICATOR 7.0 (GLOVE) ×2
GLOVE BIOGEL PI INDICATOR 7.5 (GLOVE) ×2
GLOVE ECLIPSE 7.5 STRL STRAW (GLOVE) ×3 IMPLANT
GOWN STRL REUS W/TWL LRG LVL3 (GOWN DISPOSABLE) ×6 IMPLANT
HIBICLENS CHG 4% 4OZ BTL (MISCELLANEOUS) ×3 IMPLANT
NEEDLE HYPO 22GX1.5 SAFETY (NEEDLE) ×3 IMPLANT
NS IRRIG 1000ML POUR BTL (IV SOLUTION) ×3 IMPLANT
PACK ABDOMINAL MINOR (CUSTOM PROCEDURE TRAY) ×3 IMPLANT
PROTECTOR NERVE ULNAR (MISCELLANEOUS) ×3 IMPLANT
SPONGE LAP 4X18 RFD (DISPOSABLE) IMPLANT
STRIP CLOSURE SKIN 1/2X4 (GAUZE/BANDAGES/DRESSINGS) ×2 IMPLANT
SUT PLAIN 0 NONE (SUTURE) ×3 IMPLANT
SUT VIC AB 3-0 SH 27 (SUTURE) ×2
SUT VIC AB 3-0 SH 27X BRD (SUTURE) ×1 IMPLANT
SUT VICRYL 0 UR6 27IN ABS (SUTURE) ×3 IMPLANT
SUT VICRYL 4-0 PS2 18IN ABS (SUTURE) ×3 IMPLANT
SYR CONTROL 10ML LL (SYRINGE) ×3 IMPLANT
TOWEL OR 17X24 6PK STRL BLUE (TOWEL DISPOSABLE) ×6 IMPLANT
TRAY FOLEY W/BAG SLVR 14FR (SET/KITS/TRAYS/PACK) ×3 IMPLANT

## 2019-02-25 NOTE — Lactation Note (Addendum)
This note was copied from a baby's chart. Lactation Consultation Note  Patient Name: Amy Church URKYH'C Date: 02/25/2019 Reason for consult: Follow-up assessment;1st time breastfeeding;Infant < 6lbs;Late-preterm 34-36.6wks  106 hours old LPI female who is being partially BF but mostly formula fed by her mother; baby is on [employer]. Mom has not pumped today, she told LC she had to have surgery earlier. She was sitting on the bed with CNA assisting her. She has been set up with both, a hand pump and a DEBP but has not used them. Explained to her the importance of consistent pumping for the onset of lactogenesis II, she voiced understanding.  Offered assistance with latch and mom politely declined, she said she wasn't feeling well but that she'll try to put baby to breast at a later time; she was asleep on her bassinet. Asked mom to call for assistance when needed. Mom told LC that she'll try using the DEBP when she's done eating her popsicle, once she did, LC assisted with pumping session.  Started Symphony pump on initiation setting, but mom was disappointed that she had to hold the flanges while pumping. Advised to try a pumping bra or to convert an old sport bra into a pumping bra once she's home. Mom replied: "I don't wear bras" she has very large breast with flat nipples. Mom asked LC to stop the DEBP at the 2-3 minutes mark, she voiced she didn't like it.  Feeding plan:  1. Encouraged mom to put baby to breast on cues, ideally 8-12 times/24 hours 2. Advised mom to pump whenever she's giving baby a bottle of formula to protect her supply; and to offer baby any amount of EBM she may get 3. She'll continue supplementing with Gerber Gentle according to LPI policy  Reviewed pumping schedule and feeding cues. Unsure if mom will follow feeding plan, she was proper and agreeable but didn't show much interest in breastfeeding or pumping. Let her know that as long as baby is fed we'll  support her feeding choice and acknowledged that she came as breast/formula. Mom reported all questions and concerns were answered; she's aware of [facility] OP services and will call PRN.    Maternal Data    Feeding    LATCH Score                   Interventions Interventions: Breast feeding basics reviewed;DEBP  Lactation Tools Discussed/Used Tools: Pump Breast pump type: Double-Electric Breast Pump   Consult Status Consult Status: PRN Date: 02/26/19 Follow-up type: In-patient    Amy Church Amy Church 02/25/2019, 4:55 PM

## 2019-02-25 NOTE — Anesthesia Preprocedure Evaluation (Signed)
Anesthesia Evaluation  Patient identified by MRN, date of birth, ID band Patient awake    Reviewed: Allergy & Precautions, NPO status , Patient's Chart, lab work & pertinent test results  Airway Mallampati: II  TM Distance: >3 FB Neck ROM: Full    Dental no notable dental hx.    Pulmonary neg pulmonary ROS, former smoker,    Pulmonary exam normal breath sounds clear to auscultation       Cardiovascular hypertension, negative cardio ROS Normal cardiovascular exam Rhythm:Regular Rate:Normal     Neuro/Psych negative neurological ROS  negative psych ROS   GI/Hepatic negative GI ROS, Neg liver ROS,   Endo/Other  diabetesMorbid obesity  Renal/GU negative Renal ROS  negative genitourinary   Musculoskeletal negative musculoskeletal ROS (+)   Abdominal (+) + obese,   Peds negative pediatric ROS (+)  Hematology negative hematology ROS (+)   Anesthesia Other Findings   Reproductive/Obstetrics                             Anesthesia Physical  Anesthesia Plan  ASA: III  Anesthesia Plan: Spinal   Post-op Pain Management:    Induction:   PONV Risk Score and Plan: 2 and Ondansetron, Midazolam and Treatment may vary due to age or medical condition  Airway Management Planned: Simple Face Mask  Additional Equipment:   Intra-op Plan:   Post-operative Plan:   Informed Consent:   Plan Discussed with:   Anesthesia Plan Comments:         Anesthesia Quick Evaluation

## 2019-02-25 NOTE — Lactation Note (Signed)
This note was copied from a baby's chart. Lactation Consultation Note  Patient Name: Amy Church [id number] Date: 02/25/2019 Reason for consult: Initial assessment;1st time breastfeeding;Infant < 6lbs;Late-preterm 34-36.6wks;Difficult latch P2, 15 hour female,  LPTI  Mom is active on the [facility] program in [place] and [facility] referral  was done for mom to receive [facility] loaner DEBP.  Tools given: breast shells, hand pump to pre-pump prior to latching infant at breast, DEBP due to mom having flat nipples and infant being LPTI. Per mom, she attempted to latch infant earlier yesterday but infant was sleepy only latch maybe 2 minutes mom mostly has been formula feeding infant. Mom feeding choice at admission was breast and formula feeding.  Per mom, she doesn't want to latch infant to breast at this time, Mom is tired and sleepy and she recently had  given infant 7 mls of formula prior to [facility] entering the room. LC discussed hand expression and mom taught back. Mom wants to see [facility] services to help with latching infant to breast latter today. Mom will use DEBP every 3 hours pumping both breast for 15 minutes on initial setting. Mom knows to breastfeed infant according to [place], 8 to 12 times within 24 hours, not exceed 3 hours without breastfeeding infant and not breastfeed infant longer than 30 minutes (green sheet given). Mom understands supplemental guidelines for LPTI after latching infant at breast based on infant's age/ hours of life. Mom understands to wear breast shells in bra during the day, mom will  pre-pump her breast prior to latching infant at breast with hand pump. Mom knows to call RN or LC if she has any questions, concerns or need assistance with latching infant at breast. Mom was doing STS with infant as [facility] left the room. Reviewed Baby & Me book's Breastfeeding Basics.  Mom shown how to use DEBP & how to disassemble, clean, & reassemble parts. Mom made aware of O/P services,  breastfeeding support groups, community resources, and our phone # for post-discharge questions.   Maternal Data Formula Feeding for Exclusion: Yes Reason for exclusion: Mother's choice to formula and breast feed on admission Has patient been taught Hand Expression?: Yes Does the patient have breastfeeding experience prior to this delivery?: No  Feeding Feeding Type: Bottle Fed - Formula Nipple Type: Slow - flow  LATCH Score                   Interventions Interventions: Breast feeding basics reviewed;Skin to skin;Hand express;Pre-pump if needed;Shells;Hand pump;DEBP  Lactation Tools Discussed/Used Tools: Shells;Pump Shell Type: Other (comment)(flat nipples) Breast pump type: Double-Electric Breast Pump WIC Program: Yes Pump Review: Setup, frequency, and cleaning;Milk Storage Initiated by:: Vicente Serene, IBCLC Date initiated:: 02/25/19   Consult Status Consult Status: Follow-up Date: 02/25/19 Follow-up type: In-patient    Vicente Serene 02/25/2019, 4:31 AM

## 2019-02-25 NOTE — Anesthesia Procedure Notes (Signed)
Procedure Name: Intubation Date/Time: 02/25/2019 9:58 AM Performed by: Purvis Kilts, CRNA Pre-anesthesia Checklist: Patient identified, Emergency Drugs available, Suction available, Patient being monitored and Timeout performed Patient Re-evaluated:Patient Re-evaluated prior to induction Oxygen Delivery Method: Circle system utilized Preoxygenation: Pre-oxygenation with 100% oxygen Induction Type: IV induction Laryngoscope Size: Mac and 3 Grade View: Grade II Tube type: Oral Tube size: 7.0 mm Number of attempts: 1 Airway Equipment and Method: Stylet Placement Confirmation: ETT inserted through vocal cords under direct vision,  positive ETCO2 and breath sounds checked- equal and bilateral Secured at: 22 cm Tube secured with: Tape Dental Injury: Teeth and Oropharynx as per pre-operative assessment

## 2019-02-25 NOTE — Progress Notes (Signed)
Post Partum Day #1 Subjective: no complaints, up ad lib and voiding, wants a BTL  Objective: Blood pressure 115/70, pulse 85, temperature (!) 97.5 F (36.4 C), temperature source Oral, resp. rate 18, height 5\' 6"  (1.676 m), weight 136.1 kg, last menstrual period 04/25/2018, SpO2 97 %, unknown if currently breastfeeding.  Physical Exam:  General: alert Lochia: appropriate Uterine Fundus: firm DVT Evaluation: No evidence of DVT seen on physical exam.  Recent Labs    02/24/19 0412 02/24/19 1709  HGB 11.5* 11.0*  HCT 34.1* 32.4*    Assessment/Plan: Plan for discharge tomorrow  BTL this morning   LOS: 3 days   Allure Greaser C Bernis Stecher 02/25/2019, 8:08 AM

## 2019-02-25 NOTE — Op Note (Signed)
Sentoria Brent 02/22/2019 - 02/25/2019  PREOPERATIVE DIAGNOSIS:  Undesired fertility  POSTOPERATIVE DIAGNOSIS:  Undesired fertility  PROCEDURE:  Postpartum Bilateral Tubal Sterilization using Filshie Clip on left and modified Pomeroy on the right.  SURGEON:  Dr Loma Boston  ANESTHESIA:  Epidural  COMPLICATIONS:  None immediate.  ESTIMATED BLOOD LOSS:  Less than 20cc.  FLUIDS: 400 mL LR.  URINE OUTPUT:  Unmeasured amount of clear urine.  INDICATIONS: 33 y.o. yo [id number]  with undesired fertility,status post vaginal delivery, desires permanent sterilization. Risks and benefits of procedure discussed with patient including permanence of method, bleeding, infection, injury to surrounding organs and need for additional procedures. Risk failure of 0.5-1% with increased risk of ectopic gestation if pregnancy occurs was also discussed with patient.   FINDINGS:  Normal uterus, tubes, and ovaries.  TECHNIQUE:  The patient was taken to the operating room where her epidural anesthesia was dosed up to surgical level and found to be adequate.  She was then placed in the dorsal supine position and prepped and draped in sterile fashion.  After an adequate timeout was performed, attention was turned to the patient's abdomen where a small transverse skin incision was made under the umbilical fold. The incision was taken down to the layer of fascia using the scalpel, and fascia was incised, and extended bilaterally using Mayo scissors.   The peritoneum was entered in a sharp fashion. Attention was then turned to the patient's uterus, and left fallopian tube was identified and followed out to the fimbriated end.  A Filshie clip was placed on the left fallopian tube about 2 cm from the cornual attachment, with care given to incorporate the underlying mesosalpinx.  The left tube was returned to the abdomen. The right tube was identified, grasped with a babcock and elevated from the lapartomy, during which the  mesosalpinx tore and started to bleed. A kelly was clamped across the fallopian tube and mesosalpinx. The segment of tube was then doubly ligated with free tie of plain suture, transected and excised in a modified Polmeroy technique. Excellent hemostasis was noted.  The instruments were then removed from the patient's abdomen and the fascial incision was repaired with 0 Vicryl, and the skin was closed with a 3-0 Monocryl subcuticular stitch. The patient tolerated the procedure well.  Sponge, lap, and needle counts were correct times two.  The patient was then taken to the recovery room awake, extubated and in stable condition.   Truett Mainland, DO 02/25/2019 10:33 AM

## 2019-02-25 NOTE — Anesthesia Procedure Notes (Signed)
Spinal  Patient location during procedure: OB End time: 02/25/2019 9:36 AM Staffing Performed: anesthesiologist  Anesthesiologist: Lynda Rainwater, MD Preanesthetic Checklist Completed: patient identified, IV checked, risks and benefits discussed, surgical consent, monitors and equipment checked, pre-op evaluation and timeout performed Spinal Block Patient position: sitting Prep: DuraPrep and site prepped and draped Patient monitoring: heart rate, cardiac monitor, continuous pulse ox and blood pressure Approach: midline Location: L3-4 Injection technique: single-shot Needle Needle type: Quincke  Needle gauge: 22 G Needle length: 15 cm Assessment Sensory level: T4

## 2019-02-25 NOTE — Progress Notes (Signed)
Pt refused fundal assessment. Pt described pressing on stomach hurt her surgical incision. This RN discussed the reason for fundal assessments. Pt described understanding that fundal rubs were to check for bleeding but that she did not want her stomach pushed on.

## 2019-02-25 NOTE — Transfer of Care (Signed)
Immediate Anesthesia Transfer of Care Note  Patient: Amy Church  Procedure(s) Performed: POST PARTUM TUBAL LIGATION (Bilateral )  Patient Location: PACU  Anesthesia Type:General  Level of Consciousness: awake and alert   Airway & Oxygen Therapy: Patient Spontanous Breathing and Patient connected to nasal cannula oxygen  Post-op Assessment: Report given to RN and Post -op Vital signs reviewed and stable  Post vital signs: Reviewed and stable  Last Vitals:  Vitals Value Taken Time  BP 117/85 02/25/19 1045  Temp 36.5 C 02/25/19 1045  Pulse 80 02/25/19 1049  Resp 18 02/25/19 1049  SpO2 94 % 02/25/19 1049  Vitals shown include unvalidated device data.  Last Pain:  Vitals:   02/25/19 0806  TempSrc: Oral  PainSc:       Patients Stated Pain Goal: 4 (27/07/86 7544)  Complications: No apparent anesthesia complications

## 2019-02-25 NOTE — Anesthesia Postprocedure Evaluation (Signed)
Anesthesia Post Note  Patient: Amy Church  Procedure(s) Performed: POST PARTUM TUBAL LIGATION (Bilateral )     Patient location during evaluation: PACU Anesthesia Type: General Level of consciousness: awake and alert Pain management: pain level controlled Vital Signs Assessment: post-procedure vital signs reviewed and stable Respiratory status: spontaneous breathing, nonlabored ventilation and respiratory function stable Cardiovascular status: blood pressure returned to baseline and stable Postop Assessment: no apparent nausea or vomiting Anesthetic complications: no    Last Vitals:  Vitals:   02/25/19 1130 02/25/19 1156  BP: 121/83 130/84  Pulse: 79 73  Resp: 15 16  Temp: (!) 36.4 C 36.4 C  SpO2: 98% 98%    Last Pain:  Vitals:   02/25/19 1200  TempSrc:   PainSc: 10-Worst pain ever   Pain Goal: Patients Stated Pain Goal: 4 (02/25/19 0546)                 Lynda Rainwater

## 2019-02-25 NOTE — Anesthesia Postprocedure Evaluation (Signed)
Anesthesia Post Note  Patient: Amy Church  Procedure(s) Performed: AN AD [place]     Patient location during evaluation: Mother Baby Anesthesia Type: Epidural Level of consciousness: awake Pain management: satisfactory to patient Vital Signs Assessment: post-procedure vital signs reviewed and stable Respiratory status: spontaneous breathing Cardiovascular status: stable Anesthetic complications: no    Last Vitals:  Vitals:   02/24/19 2316 02/25/19 0330  BP: 107/66 115/70  Pulse: 85 85  Resp: 18 18  Temp: 36.7 C (!) 36.4 C  SpO2: 96% 97%    Last Pain:  Vitals:   02/25/19 0546  TempSrc:   PainSc: 8    Pain Goal: Patients Stated Pain Goal: 4 (02/25/19 0546)                 Casimer Lanius

## 2019-02-26 ENCOUNTER — Encounter: Payer: Medicaid Other | Admitting: Family Medicine

## 2019-02-26 ENCOUNTER — Ambulatory Visit: Payer: Self-pay

## 2019-02-26 MED ORDER — AMLODIPINE BESYLATE 5 MG PO TABS: 5.0000 mg | ORAL_TABLET | Freq: Every day | ORAL | 2 refills | Status: DC

## 2019-02-26 MED ORDER — AMLODIPINE BESYLATE 5 MG PO TABS
5.0000 mg | ORAL_TABLET | Freq: Every day | ORAL | Status: DC
  Administered 2019-02-26: 5 mg via ORAL
  Filled 2019-02-26: qty 1

## 2019-02-26 MED ORDER — IBUPROFEN 600 MG PO TABS: 600.0000 mg | ORAL_TABLET | Freq: Four times a day (QID) | ORAL | 0 refills | Status: DC

## 2019-02-26 MED FILL — AMLODIPINE BESYLATE 5 MG TA: 5 | 30 days supply | Qty: 30 | Fill #0

## 2019-02-26 MED FILL — IBUPROFEN 600 MG TABLET: 600 | 8 days supply | Qty: 30 | Fill #0

## 2019-02-26 NOTE — Lactation Note (Signed)
This note was copied from a baby's chart. Lactation Consultation Note  Patient Name: Amy Church [id number] Date: 02/26/2019 Reason for consult: Follow-up assessment;Late-preterm 34-36.6wks;1st time breastfeeding  P2 mother whose infant is now 20 hours old.  This is a LPTI at 36+6 weeks with a CGA of 37+1 weeks.  Mother's feeding preference on admission was breast/bottle.  Baby was on the bili blanket and swaddled when I arrived.  Mother has been formula feeding only since the last lactation consultant visited yesterday.  Mother has not been interested in pumping but has been set up with the DEBP.  She stated that she has been "too exhausted" to pump.  Mother shared her Labor and delivery story with me and made it clear that she will be pumping and bottle feeding after she gets home.  I, again, expressed the importance of using the DEBP here in the hospital to help initiate and establish a good milk supply.  Mother verbalized understanding but does not wish to pump at this time.  She informed me that she pumped for 5 minutes yesterday and that was "too much."  She has a sister in law that is breast feeding a 52 year old and mother stated she thinks that is "disgusting."  She does not plan to latch.  Encouraged to pump for 15 minutes every three hours.  Acknowledged her feeling about being tired and exhausted while continuing to stress the importance of pumping.  Mother does not have a DEBP for home use.  She has not thought about how she will obtain a DEBP.  However, she is a [facility] participant in [place].  Offered to fax a referral and mother is in agreement.  She also stated that she can probably get a family member's pump but has not done any follow up with this that I am aware of.    Encouraged quiet time today and spoke with her RN to limit visits and interruptions if possible.  RN willing to provided more quiet time today.  Mother will call for any further questions/concerns.      Maternal Data Formula Feeding for Exclusion: Yes Reason for exclusion: Mother's choice to formula and breast feed on admission Has patient been taught Hand Expression?: Yes  Feeding Feeding Type: Bottle Fed - Formula Nipple Type: Slow - flow  LATCH Score                   Interventions    Lactation Tools Discussed/Used Breast pump type: Double-Electric Breast Pump;Manual WIC Program: Yes   Consult Status Consult Status: Follow-up Date: 02/27/19 Follow-up type: In-patient    Amy Church R Amy Church 02/26/2019, 9:16 AM

## 2019-02-26 NOTE — Discharge Instructions (Signed)
Postpartum Baby Blues The postpartum period begins right after the birth of a baby. During this time, there is often a lot of joy and excitement. It is also a time of many changes in the life of the parents. No matter how many times a mother gives birth, each child brings new challenges to the family, including different ways of relating to one another. It is common to have feelings of excitement along with confusing changes in moods, emotions, and thoughts. You may feel happy one minute and sad or stressed the next. These feelings of sadness usually happen in the period right after you have your baby, and they go away within a week or two. This is called the "baby blues." What are the causes? There is no known cause of baby blues. It is likely caused by a combination of factors. However, changes in hormone levels after childbirth are believed to trigger some of the symptoms. Other factors that can play a role in these mood changes include:  Lack of sleep.  Stressful life events, such as poverty, caring for a loved one, or death of a loved one.  Genetics. What are the signs or symptoms? Symptoms of this condition include:  Brief changes in mood, such as going from extreme happiness to sadness.  Decreased concentration.  Difficulty sleeping.  Crying spells and tearfulness.  Loss of appetite.  Irritability.  Anxiety. If the symptoms of baby blues last for more than 2 weeks or become more severe, you may have postpartum depression. How is this diagnosed? This condition is diagnosed based on an evaluation of your symptoms. There are no medical or lab tests that lead to a diagnosis, but there are various questionnaires that a health care provider may use to identify women with the baby blues or postpartum depression. How is this treated? Treatment is not needed for this condition. The baby blues usually go away on their own in 1-2 weeks. Social support is often all that is needed. You will  be encouraged to get adequate sleep and rest. Follow these instructions at home: Lifestyle      Get as much rest as you can. Take a nap when the baby sleeps.  Exercise regularly as told by your health care provider. Some women find yoga and walking to be helpful.  Eat a balanced and nourishing diet. This includes plenty of fruits and vegetables, whole grains, and lean proteins.  Do little things that you enjoy. Have a cup of tea, take a bubble bath, read your favorite magazine, or listen to your favorite music.  Avoid alcohol.  Ask for help with household chores, cooking, grocery shopping, or running errands. Do not try to do everything yourself. Consider hiring a postpartum doula to help. This is a professional who specializes in providing support to new mothers.  Try not to make any major life changes during pregnancy or right after giving birth. This can add stress. General instructions  Talk to people close to you about how you are feeling. Get support from your partner, family members, friends, or other new moms. You may want to join a support group.  Find ways to cope with stress. This may include: ? Writing your thoughts and feelings in a journal. ? Spending time outside. ? Spending time with people who make you laugh.  Try to stay positive in how you think. Think about the things you are grateful for.  Take over-the-counter and prescription medicines only as told by your health care provider.    Let your health care provider know if you have any concerns.  Keep all postpartum visits as told by your health care provider. This is important. Contact a health care provider if:  Your baby blues do not go away after 2 weeks. Get help right away if:  You have thoughts of taking your own life (suicidal thoughts).  You think you may harm the baby or other people.  You see or hear things that are not there (hallucinations). Summary  After giving birth, you may feel happy  one minute and sad or stressed the next. Feelings of sadness that happen right after the baby is born and go away after a week or two are called the "baby blues."  You can manage the baby blues by getting enough rest, eating a healthy diet, exercising, spending time with supportive people, and finding ways to cope with stress.  If feelings of sadness and stress last longer than 2 weeks or get in the way of caring for your baby, talk to your health care provider. This may mean you have postpartum depression. This information is not intended to replace advice given to you by your health care provider. Make sure you discuss any questions you have with your health care provider. Document Released: 11/20/2003 Document Revised: 06/09/2018 Document Reviewed: 04/13/2016 Elsevier Patient Education  2020 [employer] Preeclampsia and Eclampsia Preeclampsia is a serious condition that may develop during pregnancy. This condition causes high blood pressure and increased protein in your urine along with other symptoms, such as headaches and vision changes. These symptoms may develop as the condition gets worse. Preeclampsia may occur at 20 weeks of pregnancy or later. Diagnosing and treating preeclampsia early is very important. If not treated early, it can cause serious problems for you and your baby. One problem it can lead to is eclampsia. Eclampsia is a condition that causes muscle jerking or shaking (convulsions or seizures) and other serious problems for the mother. During pregnancy, delivering your baby may be the best treatment for preeclampsia or eclampsia. For most women, preeclampsia and eclampsia symptoms go away after giving birth. In rare cases, a woman may develop preeclampsia after giving birth (postpartum preeclampsia). This usually occurs within 48 hours after childbirth but may occur up to 6 weeks after giving birth. What are the causes? The cause of preeclampsia is not known. What increases  the risk? The following risk factors make you more likely to develop preeclampsia:  Being pregnant for the first time.  Having had preeclampsia during a past pregnancy.  Having a family history of preeclampsia.  Having high blood pressure.  Being pregnant with more than one baby.  Being 5335 or older.  Being African-American.  Having kidney disease or diabetes.  Having medical conditions such as lupus or blood diseases.  Being very overweight (obese). What are the signs or symptoms? The most common symptoms are:  Severe headaches.  Vision problems, such as blurred or double vision.  Abdominal pain, especially upper abdominal pain. Other symptoms that may develop as the condition gets worse include:  Sudden weight gain.  Sudden swelling of the hands, face, legs, and feet.  Severe nausea and vomiting.  Numbness in the face, arms, legs, and feet.  Dizziness.  Urinating less than usual.  Slurred speech.  Convulsions or seizures. How is this diagnosed? There are no screening tests for preeclampsia. Your health care provider will ask you about symptoms and check for signs of preeclampsia during your prenatal visits. You may also have  tests that include:  Checking your blood pressure.  Urine tests to check for protein. Your health care provider will check for this at every prenatal visit.  Blood tests.  Monitoring your baby's heart rate.  Ultrasound. How is this treated? You and your health care provider will determine the treatment approach that is best for you. Treatment may include:  Having more frequent prenatal exams to check for signs of preeclampsia, if you have an increased risk for preeclampsia.  Medicine to lower your blood pressure.  Staying in the hospital, if your condition is severe. There, treatment will focus on controlling your blood pressure and the amount of fluids in your body (fluid retention).  Taking medicine (magnesium sulfate) to  prevent seizures. This may be given as an injection or through an IV.  Taking a low-dose aspirin during your pregnancy.  Delivering your baby early. You may have your labor started with medicine (induced), or you may have a cesarean delivery. Follow these instructions at home: Eating and drinking   Drink enough fluid to keep your urine pale yellow.  Avoid caffeine. Lifestyle  Do not use any products that contain nicotine or tobacco, such as cigarettes and e-cigarettes. If you need help quitting, ask your health care provider.  Do not use alcohol or drugs.  Avoid stress as much as possible. Rest and get plenty of sleep. General instructions  Take over-the-counter and prescription medicines only as told by your health care provider.  When lying down, lie on your left side. This keeps pressure off your major blood vessels.  When sitting or lying down, raise (elevate) your feet. Try putting some pillows underneath your lower legs.  Exercise regularly. Ask your health care provider what kinds of exercise are best for you.  Keep all follow-up and prenatal visits as told by your health care provider. This is important. How is this prevented? There is no known way of preventing preeclampsia or eclampsia from developing. However, to lower your risk of complications and detect problems early:  Get regular prenatal care. Your health care provider may be able to diagnose and treat the condition early.  Maintain a healthy weight. Ask your health care provider for help managing weight gain during pregnancy.  Work with your health care provider to manage any long-term (chronic) health conditions you have, such as diabetes or kidney problems.  You may have tests of your blood pressure and kidney function after giving birth.  Your health care provider may have you take low-dose aspirin during your next pregnancy. Contact a health care provider if:  You have symptoms that your health care  provider told you may require more treatment or monitoring, such as: ? Headaches. ? Nausea or vomiting. ? Abdominal pain. ? Dizziness. ? Light-headedness. Get help right away if:  You have severe: ? Abdominal pain. ? Headaches that do not get better. ? Dizziness. ? Vision problems. ? Confusion. ? Nausea or vomiting.  You have any of the following: ? A seizure. ? Sudden, rapid weight gain. ? Sudden swelling in your hands, ankles, or face. ? Trouble moving any part of your body. ? Numbness in any part of your body. ? Trouble speaking. ? Abnormal bleeding.  You faint. Summary  Preeclampsia is a serious condition that may develop during pregnancy.  This condition causes high blood pressure and increased protein in your urine along with other symptoms, such as headaches and vision changes.  Diagnosing and treating preeclampsia early is very important. If not treated early,  it can cause serious problems for you and your baby.  Get help right away if you have symptoms that your health care provider told you to watch for. This information is not intended to replace advice given to you by your health care provider. Make sure you discuss any questions you have with your health care provider. Document Released: 02/13/2000 Document Revised: 10/18/2017 Document Reviewed: 09/22/2015 Elsevier Patient Education  2020 Else[employer] Inc. Laparoscopic Tubal Ligation, Care After This sheet gives you information about how to care for yourself after your procedure. Your health care provider may also give you more specific instructions. If you have problems or questions, contact your health care provider. What can I expect after the procedure? After the procedure, it is common to have:  A sore throat.  Discomfort in your shoulder.  Mild discomfort or cramping in your abdomen.  Gas pains.  Pain or soreness in the area where the surgical incision was made.  A bloated  feeling.  Tiredness.  Nausea.  Vomiting. Follow these instructions at home: Medicines  Take over-the-counter and prescription medicines only as told by your health care provider.  Do not take aspirin because it can cause bleeding.  Ask your health care provider if the medicine prescribed to you: ? Requires you to avoid driving or using heavy machinery. ? Can cause constipation. You may need to take actions to prevent or treat constipation, such as:  Drink enough fluid to keep your urine pale yellow.  Take over-the-counter or prescription medicines.  Eat foods that are high in fiber, such as beans, whole grains, and fresh fruits and vegetables.  Limit foods that are high in fat and processed sugars, such as fried or sweet foods. Incision care      Follow instructions from your health care provider about how to take care of your incision. Make sure you: ? Wash your hands with soap and water before and after you change your bandage (dressing). If soap and water are not available, use hand sanitizer. ? Change your dressing as told by your health care provider. ? Leave stitches (sutures), skin glue, or adhesive strips in place. These skin closures may need to stay in place for 2 weeks or longer. If adhesive strip edges start to loosen and curl up, you may trim the loose edges. Do not remove adhesive strips completely unless your health care provider tells you to do that.  Check your incision area every day for signs of infection. Check for: ? Redness, swelling, or pain. ? Fluid or blood. ? Warmth. ? Pus or a bad smell. Activity  Rest as told by your health care provider.  Avoid sitting for a long time without moving. Get up to take short walks every 1-2 hours. This is important to improve blood flow and breathing. Ask for help if you feel weak or unsteady.  Return to your normal activities as told by your health care provider. Ask your health care provider what activities are  safe for you. General instructions  Do not take baths, swim, or use a hot tub until your health care provider approves. Ask your health care provider if you may take showers. You may only be allowed to take sponge baths.  Have someone help you with your daily household tasks for the first few days.  Keep all follow-up visits as told by your health care provider. This is important. Contact a health care provider if:  You have redness, swelling, or pain around your incision.  Your  incision feels warm to the touch.  You have pus or a bad smell coming from your incision.  The edges of your incision break open after the sutures have been removed.  Your pain does not improve after 2-3 days.  You have a rash.  You repeatedly become dizzy or light-headed.  Your pain medicine is not helping. Get help right away if you:  Have a fever.  Faint.  Have increasing pain in your abdomen.  Have severe pain in one or both of your shoulders.  Have fluid or blood coming from your sutures or from your vagina.  Have shortness of breath or difficulty breathing.  Have chest pain or leg pain.  Have ongoing nausea, vomiting, or diarrhea. Summary  After the procedure, it is common to have mild discomfort or cramping in your abdomen.  Take over-the-counter and prescription medicines only as told by your health care provider.  Watch for symptoms that should prompt you to call your health care provider.  Keep all follow-up visits as told by your health care provider. This is important. This information is not intended to replace advice given to you by your health care provider. Make sure you discuss any questions you have with your health care provider. Document Released: 09/04/2004 Document Revised: 01/10/2018 Document Reviewed: 01/10/2018 Elsevier Patient Education  2020 [employer] Inc. Postpartum Care After Vaginal Delivery This sheet gives you information about how to care for yourself  from the time you deliver your baby to up to 6-12 weeks after delivery (postpartum period). Your health care provider may also give you more specific instructions. If you have problems or questions, contact your health care provider. Follow these instructions at home: Vaginal bleeding  It is normal to have vaginal bleeding (lochia) after delivery. Wear a sanitary pad for vaginal bleeding and discharge. ? During the first week after delivery, the amount and appearance of lochia is often similar to a menstrual period. ? Over the next few weeks, it will gradually decrease to a dry, yellow-brown discharge. ? For most women, lochia stops completely by 4-6 weeks after delivery. Vaginal bleeding can vary from woman to woman.  Change your sanitary pads frequently. Watch for any changes in your flow, such as: ? A sudden increase in volume. ? A change in color. ? Large blood clots.  If you pass a blood clot from your vagina, save it and call your health care provider to discuss. Do not flush blood clots down the toilet before talking with your health care provider.  Do not use tampons or douches until your health care provider says this is safe.  If you are not breastfeeding, your period should return 6-8 weeks after delivery. If you are feeding your child breast milk only (exclusive breastfeeding), your period may not return until you stop breastfeeding. Perineal care  Keep the area between the vagina and the anus (perineum) clean and dry as told by your health care provider. Use medicated pads and pain-relieving sprays and creams as directed.  If you had a cut in the perineum (episiotomy) or a tear in the vagina, check the area for signs of infection until you are healed. Check for: ? More redness, swelling, or pain. ? Fluid or blood coming from the cut or tear. ? Warmth. ? Pus or a bad smell.  You may be given a squirt bottle to use instead of wiping to clean the perineum area after you go to  the bathroom. As you start healing, you may use  the squirt bottle before wiping yourself. Make sure to wipe gently.  To relieve pain caused by an episiotomy, a tear in the vagina, or swollen veins in the anus (hemorrhoids), try taking a warm sitz bath 2-3 times a day. A sitz bath is a warm water bath that is taken while you are sitting down. The water should only come up to your hips and should cover your buttocks. Breast care  Within the first few days after delivery, your breasts may feel heavy, full, and uncomfortable (breast engorgement). Milk may also leak from your breasts. Your health care provider can suggest ways to help relieve the discomfort. Breast engorgement should go away within a few days.  If you are breastfeeding: ? Wear a bra that supports your breasts and fits you well. ? Keep your nipples clean and dry. Apply creams and ointments as told by your health care provider. ? You may need to use breast pads to absorb milk that leaks from your breasts. ? You may have uterine contractions every time you breastfeed for up to several weeks after delivery. Uterine contractions help your uterus return to its normal size. ? If you have any problems with breastfeeding, work with your health care provider or Science writer.  If you are not breastfeeding: ? Avoid touching your breasts a lot. Doing this can make your breasts produce more milk. ? Wear a good-fitting bra and use cold packs to help with swelling. ? Do not squeeze out (express) milk. This causes you to make more milk. Intimacy and sexuality  Ask your health care provider when you can engage in sexual activity. This may depend on: ? Your risk of infection. ? How fast you are healing. ? Your comfort and desire to engage in sexual activity.  You are able to get pregnant after delivery, even if you have not had your period. If desired, talk with your health care provider about methods of birth control  (contraception). Medicines  Take over-the-counter and prescription medicines only as told by your health care provider.  If you were prescribed an antibiotic medicine, take it as told by your health care provider. Do not stop taking the antibiotic even if you start to feel better. Activity  Gradually return to your normal activities as told by your health care provider. Ask your health care provider what activities are safe for you.  Rest as much as possible. Try to rest or take a nap while your baby is sleeping. Eating and drinking   Drink enough fluid to keep your urine pale yellow.  Eat high-fiber foods every day. These may help prevent or relieve constipation. High-fiber foods include: ? Whole grain cereals and breads. ? Brown rice. ? Beans. ? Fresh fruits and vegetables.  Do not try to lose weight quickly by cutting back on calories.  Take your prenatal vitamins until your postpartum checkup or until your health care provider tells you it is okay to stop. Lifestyle  Do not use any products that contain nicotine or tobacco, such as cigarettes and e-cigarettes. If you need help quitting, ask your health care provider.  Do not drink alcohol, especially if you are breastfeeding. General instructions  Keep all follow-up visits for you and your baby as told by your health care provider. Most women visit their health care provider for a postpartum checkup within the first 3-6 weeks after delivery. Contact a health care provider if:  You feel unable to cope with the changes that your child brings to  your life, and these feelings do not go away.  You feel unusually sad or worried.  Your breasts become red, painful, or hard.  You have a fever.  You have trouble holding urine or keeping urine from leaking.  You have little or no interest in activities you used to enjoy.  You have not breastfed at all and you have not had a menstrual period for 12 weeks after delivery.  You  have stopped breastfeeding and you have not had a menstrual period for 12 weeks after you stopped breastfeeding.  You have questions about caring for yourself or your baby.  You pass a blood clot from your vagina. Get help right away if:  You have chest pain.  You have difficulty breathing.  You have sudden, severe leg pain.  You have severe pain or cramping in your lower abdomen.  You bleed from your vagina so much that you fill more than one sanitary pad in one hour. Bleeding should not be heavier than your heaviest period.  You develop a severe headache.  You faint.  You have blurred vision or spots in your vision.  You have bad-smelling vaginal discharge.  You have thoughts about hurting yourself or your baby. If you ever feel like you may hurt yourself or others, or have thoughts about taking your own life, get help right away. You can go to the nearest emergency department or call:  Your local emergency services (911 in the [place]  A suicide crisis helpline, such as the National Suicide Prevention Lifeline at 309 879 7584. This is open 24 hours a day. Summary  The period of time right after you deliver your newborn up to 6-12 weeks after delivery is called the postpartum period.  Gradually return to your normal activities as told by your health care provider.  Keep all follow-up visits for you and your baby as told by your health care provider. This information is not intended to replace advice given to you by your health care provider. Make sure you discuss any questions you have with your health care provider. Document Released: 12/13/2006 Document Revised: 02/18/2017 Document Reviewed: 11/29/2016 Elsevier Patient Education  2020 [employer].

## 2019-02-26 NOTE — Lactation Note (Signed)
This note was copied from a baby's chart. Lactation Consultation Note  Patient Name: Amy Church Today's Date: 02/26/2019   Spoke with RN who reports mom has decided to pump.  She will be discharged and baby will be a baby patient.  RN asked if I would go over pumping with her.  Attempted to see mom.  She is in rest room.  Let her know I would be back later.  Maternal Data    Feeding Feeding Type: Bottle Fed - Formula Nipple Type: Slow - flow  LATCH Score                   Interventions    Lactation Tools Discussed/Used     Consult Status      Amy Church 02/26/2019, 4:37 PM

## 2019-02-26 NOTE — Progress Notes (Signed)
Discharge instructions and prescriptions given to pt. Discussed pre-eclampsia, PP depression, signs and symptoms to report to the MD, upcoming appointments, and meds. Pt verbalizes understanding and has no questions or concerns at this time.

## 2019-02-26 NOTE — Lactation Note (Signed)
This note was copied from a baby's chart. Lactation Consultation Note  Patient Name: Amy Church LMBEM'L Date: 02/26/2019  [facility] went back to see patient as requested by RN.  Mom reports she just pumped.  Mom reports she didn't get anything with pumping. Mom reports she has not had any breast changes since delivery.  Mom inquired about how often to pump.  Urged pump to pump every time infant fed a bottle.  Mom reports she doesnt know if she can do that right now she's just too tired. Urged her to pump as often as shes able.  Call lactation as needed.  Maternal Data    Feeding Feeding Type: Bottle Fed - Formula Nipple Type: Slow - flow  LATCH Score                   Interventions    Lactation Tools Discussed/Used     Consult Status      Amy Church 02/26/2019, 6:30 PM

## 2019-02-26 NOTE — Progress Notes (Signed)
This RN found pt co-sleeping with infant when rounding. This RN woke the pt in order to safely remove the infant from the patient's bed as the patient's arm was hanging over the infants phototherapy cord. This RN informed the patient about the hospital policy on safe sleep and that co-sleeping with the infant put the infant at risk for suffocation and SIDS. This RN informed the patient that safe sleeping practices include the infant sleeping on a firm, flat surface, alone, and without anything else in the crib. The patient verbalized understanding but refused putting the infant back into the bassinet.

## 2019-02-27 ENCOUNTER — Ambulatory Visit (HOSPITAL_COMMUNITY): Payer: No Typology Code available for payment source

## 2019-02-27 ENCOUNTER — Encounter (HOSPITAL_COMMUNITY): Payer: Self-pay | Admitting: Obstetrics and Gynecology

## 2019-02-27 ENCOUNTER — Inpatient Hospital Stay (HOSPITAL_COMMUNITY)
Admission: AD | Admit: 2019-02-27 | Discharge: 2019-02-27 | Disposition: A | Discharge status: Home / Self Care | Payer: Medicaid Other | Attending: Obstetrics and Gynecology | Admitting: Obstetrics and Gynecology

## 2019-02-27 ENCOUNTER — Ambulatory Visit: Payer: Self-pay

## 2019-02-27 ENCOUNTER — Other Ambulatory Visit: Payer: Self-pay

## 2019-02-27 ENCOUNTER — Encounter (HOSPITAL_COMMUNITY): Payer: Self-pay

## 2019-02-27 DIAGNOSIS — O0990 Supervision of high risk pregnancy, unspecified, unspecified trimester: Secondary | ICD-10-CM

## 2019-02-27 DIAGNOSIS — Z808 Family history of malignant neoplasm of other organs or systems: Secondary | ICD-10-CM | POA: Insufficient documentation

## 2019-02-27 DIAGNOSIS — I10 Essential (primary) hypertension: Secondary | ICD-10-CM | POA: Diagnosis not present

## 2019-02-27 DIAGNOSIS — Z833 Family history of diabetes mellitus: Secondary | ICD-10-CM | POA: Diagnosis not present

## 2019-02-27 DIAGNOSIS — O99893 Other specified diseases and conditions complicating puerperium: Secondary | ICD-10-CM

## 2019-02-27 DIAGNOSIS — R519 Headache, unspecified: Secondary | ICD-10-CM

## 2019-02-27 DIAGNOSIS — Z791 Long term (current) use of non-steroidal anti-inflammatories (NSAID): Secondary | ICD-10-CM | POA: Diagnosis not present

## 2019-02-27 DIAGNOSIS — Z87891 Personal history of nicotine dependence: Secondary | ICD-10-CM | POA: Insufficient documentation

## 2019-02-27 DIAGNOSIS — Z880 Allergy status to penicillin: Secondary | ICD-10-CM | POA: Insufficient documentation

## 2019-02-27 DIAGNOSIS — O9089 Other complications of the puerperium, not elsewhere classified: Secondary | ICD-10-CM | POA: Insufficient documentation

## 2019-02-27 DIAGNOSIS — Z79899 Other long term (current) drug therapy: Secondary | ICD-10-CM | POA: Diagnosis not present

## 2019-02-27 DIAGNOSIS — O10919 Unspecified pre-existing hypertension complicating pregnancy, unspecified trimester: Secondary | ICD-10-CM

## 2019-02-27 DIAGNOSIS — Z8249 Family history of ischemic heart disease and other diseases of the circulatory system: Secondary | ICD-10-CM | POA: Insufficient documentation

## 2019-02-27 LAB — COMPREHENSIVE METABOLIC PANEL
ALT: 30 U/L (ref 0–44)
AST: 45 U/L — ABNORMAL HIGH (ref 15–41)
Albumin: 2.2 g/dL — ABNORMAL LOW (ref 3.5–5.0)
Alkaline Phosphatase: 70 U/L (ref 38–126)
Anion gap: 10 (ref 5–15)
BUN: 8 mg/dL (ref 6–20)
CO2: 23 mmol/L (ref 22–32)
Calcium: 8.1 mg/dL — ABNORMAL LOW (ref 8.9–10.3)
Chloride: 106 mmol/L (ref 98–111)
Creatinine, Ser: 0.62 mg/dL (ref 0.44–1.00)
GFR calc Af Amer: >60 mL/min (ref >60)
GFR calc non Af Amer: >60 mL/min (ref >60)
Glucose, Bld: 116 mg/dL — ABNORMAL HIGH (ref 70–99)
Potassium: 3.8 mmol/L (ref 3.5–5.1)
Sodium: 139 mmol/L (ref 135–145)
Total Bilirubin: 0.3 mg/dL (ref 0.3–1.2)
Total Protein: 5.2 g/dL — ABNORMAL LOW (ref 6.5–8.1)

## 2019-02-27 LAB — URINALYSIS, ROUTINE W REFLEX MICROSCOPIC
Bilirubin Urine: NEGATIVE
Glucose, UA: NEGATIVE mg/dL
Ketones, ur: NEGATIVE mg/dL
Nitrite: NEGATIVE
Protein, ur: 30 mg/dL — AB
Specific Gravity, Urine: 1.015 (ref 1.005–1.030)
pH: 6 (ref 5.0–8.0)

## 2019-02-27 LAB — CBC
HCT: 30.1 % — ABNORMAL LOW (ref 36.0–46.0)
Hemoglobin: 10.1 g/dL — ABNORMAL LOW (ref 12.0–15.0)
MCH: 30.1 pg (ref 26.0–34.0)
MCHC: 33.6 g/dL (ref 30.0–36.0)
MCV: 89.9 fL (ref 80.0–100.0)
Platelets: 354 10*3/uL (ref 150–400)
RBC: 3.35 MIL/uL — ABNORMAL LOW (ref 3.87–5.11)
RDW: 13.2 % (ref 11.5–15.5)
WBC: 5.4 10*3/uL (ref 4.0–10.5)
nRBC: 0 % (ref 0.0–0.2)

## 2019-02-27 LAB — URINALYSIS, MICROSCOPIC (REFLEX): RBC / HPF: >50 RBC/hpf (ref 0–5)

## 2019-02-27 MED ORDER — METOCLOPRAMIDE HCL 5 MG/ML IJ SOLN
10.0000 mg | Freq: Once | INTRAMUSCULAR | Status: AC
  Administered 2019-02-27: 10 mg via INTRAVENOUS
  Filled 2019-02-27: qty 2

## 2019-02-27 MED ORDER — LACTATED RINGERS IV BOLUS
250.0000 mL | Freq: Once | INTRAVENOUS | Status: AC
  Administered 2019-02-27: 250 mL via INTRAVENOUS

## 2019-02-27 MED ORDER — DEXAMETHASONE SODIUM PHOSPHATE 10 MG/ML IJ SOLN
10.0000 mg | Freq: Once | INTRAMUSCULAR | Status: AC
  Administered 2019-02-27: 10 mg via INTRAVENOUS
  Filled 2019-02-27: qty 1

## 2019-02-27 MED ORDER — DIPHENHYDRAMINE HCL 50 MG/ML IJ SOLN
25.0000 mg | Freq: Once | INTRAMUSCULAR | Status: AC
  Administered 2019-02-27: 25 mg via INTRAVENOUS
  Filled 2019-02-27: qty 1

## 2019-02-27 NOTE — Discharge Instructions (Signed)
Vaginal Delivery Care After Refer to this sheet in the next few weeks. These discharge instructions provide you with information on caring for yourself after delivery. Your caregiver may also give you specific instructions. Your treatment has been planned according to the most current medical practices available, but problems sometimes occur. Call your caregiver if you have any problems or questions after you go home. HOME CARE INSTRUCTIONS  Take over-the-counter or prescription medicines only as directed by your caregiver or pharmacist.  Do not drink alcohol, especially if you are breastfeeding or taking medicine to relieve pain.  Do not chew or smoke tobacco.  Do not use illegal drugs.  Continue to use good perineal care. Good perineal care includes:  Wiping your perineum from front to back.  Keeping your perineum clean.  Do not use tampons or douche until your caregiver says it is okay.  Shower, wash your hair, and take tub baths as directed by your caregiver.  Wear a well-fitting bra that provides breast support.  Eat healthy foods.  Drink enough fluids to keep your urine clear or pale yellow.  Eat high-fiber foods such as whole grain cereals and breads, brown rice, beans, and fresh fruits and vegetables every day. These foods may help prevent or relieve constipation.  Follow your cargiver's recommendations regarding resumption of activities such as climbing stairs, driving, lifting, exercising, or traveling.  Talk to your caregiver about resuming sexual activities. Resumption of sexual activities is dependent upon your risk of infection, your rate of healing, and your comfort and desire to resume sexual activity.  Try to have someone help you with your household activities and your newborn for at least a few days after you leave the hospital.  Rest as much as possible. Try to rest or take a nap when your newborn is sleeping.  Increase your activities gradually.  Keep all  of your scheduled postpartum appointments. It is very important to keep your scheduled follow-up appointments. At these appointments, your caregiver will be checking to make sure that you are healing physically and emotionally. SEEK MEDICAL CARE IF:   You are passing large clots from your vagina. Save any clots to show your caregiver.  You have a foul smelling discharge from your vagina.  You have trouble urinating.  You are urinating frequently.  You have pain when you urinate.  You have a change in your bowel movements.  You have increasing redness, pain, or swelling near your vaginal incision (episiotomy) or vaginal tear.  You have pus draining from your episiotomy or vaginal tear.  Your episiotomy or vaginal tear is separating.  You have painful, hard, or reddened breasts.  You have a severe headache.  You have blurred vision or see spots.  You feel sad or depressed.  You have thoughts of hurting yourself or your newborn.  You have questions about your care, the care of your newborn, or medicines.  You are dizzy or lightheaded.  You have a rash.  You have nausea or vomiting.  You were breastfeeding and have not had a menstrual period within 12 weeks after you stopped breastfeeding.  You are not breastfeeding and have not had a menstrual period by the 12th week after delivery.  You have a fever. SEEK IMMEDIATE MEDICAL CARE IF:   You have persistent pain.  You have chest pain.  You have shortness of breath.  You faint.  You have leg pain.  You have stomach pain.  Your vaginal bleeding saturates two or more sanitary pads  in 1 hour. MAKE SURE YOU:   Understand these instructions.  Will watch your condition.  Will get help right away if you are not doing well or get worse. Document Released: 02/13/2000 Document Revised: 11/10/2011 Document Reviewed: 10/13/2011 [facility] Patient Information 2014 [place].    Perinatal Depression When a woman  feels excessive sadness, anger, or anxiety during pregnancy or during the first 12 months after she gives birth, she has a condition called perinatal depression. Depression can interfere with work, school, relationships, and other everyday activities. If it is not managed properly, it can also cause problems in the mother and her baby. Sometimes, perinatal depression is left untreated because symptoms are thought to be normal mood swings during and right after pregnancy. If you have symptoms of depression, it is important to talk with your health care provider. What are the causes? The exact cause of this condition is not known. Hormonal changes during and after pregnancy may play a role in causing perinatal depression. What increases the risk? You are more likely to develop this condition if:  You have a personal or family history of depression, anxiety, or mood disorders.  You experience a stressful life event during pregnancy, such as the death of a loved one.  You have a lot of regular life stress.  You do not have support from family members or loved ones, or you are in an abusive relationship. What are the signs or symptoms? Symptoms of this condition include:  Feeling sad or hopeless.  Feelings of guilt.  Feeling irritable or overwhelmed.  Changes in your appetite.  Lack of energy or motivation.  Sleep problems.  Difficulty concentrating or completing tasks.  Loss of interest in hobbies or relationships.  Headaches or stomach problems that do not go away. How is this diagnosed? This condition is diagnosed based on a physical exam and mental evaluation. In some cases, your health care provider may use a depression screening tool. These tools include a list of questions that can help a health care provider diagnose depression. Your health care provider may refer you to a mental health expert who specializes in depression. How is this treated? This condition may be treated  with:  Medicines. Your health care provider will only give you medicines that have been proven safe for pregnancy and breastfeeding.  Talk therapy with a mental health professional to help change your patterns of thinking (cognitive behavioral therapy).  Support groups.  Brain stimulation or light therapies.  Stress reduction therapies, such as mindfulness. Follow these instructions at home: Lifestyle  Do not use any products that contain nicotine or tobacco, such as cigarettes and e-cigarettes. If you need help quitting, ask your health care provider.  Do not use alcohol when you are pregnant. After your baby is born, limit alcohol intake to no more than 1 drink a day. One drink equals 12 oz of beer, 5 oz of wine, or 1 oz of hard liquor.  Consider joining a support group for new mothers. Ask your health care provider for recommendations.  Take good care of yourself. Make sure you: ? Get plenty of sleep. If you are having trouble sleeping, talk with your health care provider. ? Eat a healthy diet. This includes plenty of fruits and vegetables, whole grains, and lean proteins. ? Exercise regularly, as told by your health care provider. Ask your health care provider what exercises are safe for you. General instructions  Take over-the-counter and prescription medicines only as told by your health  care provider.  Talk with your partner or family members about your feelings during pregnancy. Share any concerns or anxieties that you may have.  Ask for help with tasks or chores when you need it. Ask friends and family members to provide meals, watch your children, or help with cleaning.  Keep all follow-up visits as told by your health care provider. This is important. Contact a health care provider if:  You (or people close to you) notice that you have any symptoms of depression.  You have depression and your symptoms get worse.  You experience side effects from medicines, such as  nausea or sleep problems. Get help right away if:  You feel like hurting yourself, your baby, or someone else. If you ever feel like you may hurt yourself or others, or have thoughts about taking your own life, get help right away. You can go to your nearest emergency department or call:  Your local emergency services (911 in the [place]  A suicide crisis helpline, such as the National Suicide Prevention Lifeline at 315-241-8875. This is open 24 hours a day. Summary  Perinatal depression is when a woman feels excessive sadness, anger, or anxiety during pregnancy or during the first 12 months after she gives birth.  If perinatal depression is not treated, it can lead to health problems for the mother and her baby.  This condition is treated with medicines, talk therapy, stress reduction therapies, or a combination of two or more treatments.  Talk with your partner or family members about your feelings. Do not be afraid to ask for help. This information is not intended to replace advice given to you by your health care provider. Make sure you discuss any questions you have with your health care provider. Document Released: 04/14/2016 Document Revised: 02/18/2017 Document Reviewed: 04/14/2016 Elsevier Patient Education  2020 [employer].

## 2019-02-27 NOTE — MAU Provider Note (Addendum)
History     CSN: [id number]  Arrival date and time: 02/27/19 3785  First Provider Initiated Contact with Patient 02/27/19 1035     Chief Complaint  Patient presents with  . Headache  . Neck Pain   HPI Amy Church is a 33 y.o. [id number] postpartum patient who presents to MAU with chief complaints of headache and neck pain. Patient is s/p vacuum assisted vaginal delivery on 02/24/19 and BTL on 02/25/19. Patient states she has had a continuous headache that began prior to her hospital admission. She states her headache has been 10/10 for about 6-7 days. She has taken medication as prescribed but has not experienced relief. She denies aggravating or alleviating factors.  Patient also endorses recurrent bilateral neck pain which radiates to her deltoids. She states she thinks it is from sleeping in hospital beds and leaning over her baby's crib. Her neck pain improves slightly when she adopts a High Fowler's position.  Patient is complaint with Norvasc 5mg  as prescribed. She denies visual disturbances, RUQ/epigastric pain, new onset weight gain. Patient is concerned that her legs are swollen "I'm a big girl but I usually have really skinny legs". She denies unilateral swelling, calf pain, calf discoloration, SOB, weakness.  Patient is extremely tearful. Her baby is under bilirubin lights on [facility] and patient is "doing everything for her". She states she has "never been so alone" and "have so much help at home but no one will help me now". She endorses history of depression, previously on medications. She denies SI, HI, IPV.  OB History as of 02/06/2019    Gravida  4   Para  1   Term  1   Preterm      AB  2   Living  1     SAB      TAB  2   Ectopic      Multiple      Live Births  1           Past Medical History:  Diagnosis Date  . Chronic fungal otitis externa   . Gestational diabetes   . Hypertension    Did not have during previous pregnancy  .  Temporomandibular jaw dysfunction     Past Surgical History:  Procedure Laterality Date  . TUBAL LIGATION Bilateral 02/25/2019   Procedure: POST PARTUM TUBAL LIGATION;  Surgeon: 02/27/2019, DO;  Location: [facility] LD ORS;  Service: Gynecology;  Laterality: Bilateral;  . WISDOM TOOTH EXTRACTION      Family History  Problem Relation Age of Onset  . Heart disease Paternal Grandmother   . Hypertension Paternal Grandmother   . Diabetes Paternal Grandmother   . Cancer Mother        pelvic    Social History   Tobacco Use  . Smoking status: Former Smoker    Packs/day: 0.25    Types: Cigarettes  . Smokeless tobacco: Never Used  Substance Use Topics  . Alcohol use: Not Currently  . Drug use: Not Currently    Types: Marijuana    Comment: stopped with +UPT    Allergies:  Allergies  Allergen Reactions  . Penicillins Hives, Shortness Of Breath and Swelling    Has patient had a PCN reaction causing immediate rash, facial/tongue/throat swelling, SOB or lightheadedness with hypotension: yes Has patient had a PCN reaction causing severe rash involving mucus membranes or skin necrosis: no Has patient had a PCN reaction that required hospitalization yes Has patient had a PCN  reaction occurring within the last 10 years: yes If all of the above answers are "NO", then may proceed with Cephalosporin use.     Medications Prior to Admission  Medication Sig Dispense Refill Last Dose  . amLODipine (NORVASC) 5 MG tablet Take 1 tablet (5 mg total) by mouth daily. 30 tablet 2 02/27/2019 at Unknown time  . ibuprofen (ADVIL) 600 MG tablet Take 1 tablet (600 mg total) by mouth every 6 (six) hours. 30 tablet 0 02/27/2019 at Unknown time  . prenatal vitamin w/FE, FA (PRENATAL 1 + 1) 27-1 MG TABS tablet Take 1 tablet by mouth daily at 12 noon. 30 each 11 Past Month at Unknown time  . AMBULATORY NON FORMULARY MEDICATION Blood pressure cuff/Monitored regularly at home.  ICD 10: ZO09.90 1 Device 0      Review of Systems  Constitutional: Positive for fatigue. Negative for activity change, appetite change, chills and fever.  Eyes: Negative for photophobia and visual disturbance.  Respiratory: Negative for shortness of breath.   Cardiovascular: Negative for chest pain and palpitations.  Gastrointestinal: Negative for abdominal pain, nausea and vomiting.  Genitourinary: Negative for vaginal discharge and vaginal pain.  Musculoskeletal: Positive for neck pain and neck stiffness. Negative for back pain.       Patient denies difficulty walking but requests wheelchair to and from MAU  Neurological: Positive for headaches. Negative for dizziness, syncope and weakness.  All other systems reviewed and are negative.  Physical Exam   Blood pressure 132/78, pulse 70, temperature 98.2 F (36.8 C), temperature source Oral, resp. rate 18, weight (!) 137 kg, last menstrual period 04/25/2018, SpO2 99 %, unknown if currently breastfeeding.  Physical Exam  Nursing note and vitals reviewed. Constitutional: She is oriented to person, place, and time. Vital signs are normal. She appears well-developed and well-nourished.  Non-toxic appearance. She does not have a sickly appearance. She does not appear ill. She appears distressed.  Cardiovascular: Normal rate and normal pulses.  Respiratory: Effort normal and breath sounds normal. No respiratory distress. She has no decreased breath sounds.  GI: Soft. Bowel sounds are normal. She exhibits no distension. There is no abdominal tenderness. There is no rebound, no guarding and no CVA tenderness.  Musculoskeletal:        General: Normal range of motion.  Neurological: She is alert and oriented to person, place, and time. She has normal strength. She is not disoriented. No cranial nerve deficit or sensory deficit.  Skin: Skin is warm and dry.  Psychiatric: She has a normal mood and affect. Her speech is normal and behavior is normal. Judgment and thought content  normal.   Lower extremity measurements symmetrical bilaterally.  MAU Course/MDM  Procedures  --Sleeping 20 minutes after IV headache cocktail --Discussed history of depression, influence on risk for postpartum depression --Reassured that current level of swelling is WNL, continue PO hydration with water. Patient to start elevating legs when seated, ask relative to drop off her compression socks from home --Encouraged patient to monitor body mechanics while caring for her baby as neck pain presents as musculoskeletal --Abnormal UA with Squam noted. Urine culture ordered, confirmed add-on order received by lab --S/p IOL for Chronic Hypertension with Superimposed Severe Preeclampsia. Patient managed with Magnesium Sulfate infusion  Patient Vitals for the past 24 hrs:  BP Temp Temp src Pulse Resp SpO2 Weight  02/27/19 1237 139/78 -- -- -- -- -- --  02/27/19 1020 132/78 -- -- 63 -- 97 % --  02/27/19 1019 132/78 98.2  F (36.8 C) Oral 70 18 -- --  02/27/19 1018 -- -- -- -- -- 99 % --  02/27/19 1013 -- -- -- -- -- -- (!) 137 kg   Results for orders placed or performed during the hospital encounter of 02/27/19 (from the past 24 hour(s))  CBC     Status: Abnormal   Collection Time: 02/27/19 10:53 AM  Result Value Ref Range   WBC 5.4 4.0 - 10.5 K/uL   RBC 3.35 (L) 3.87 - 5.11 MIL/uL   Hemoglobin 10.1 (L) 12.0 - 15.0 g/dL   HCT 16.1 (L) 09.6 - 04.5 %   MCV 89.9 80.0 - 100.0 fL   MCH 30.1 26.0 - 34.0 pg   MCHC 33.6 30.0 - 36.0 g/dL   RDW 40.9 81.1 - 91.4 %   Platelets 354 150 - 400 K/uL   nRBC 0.0 0.0 - 0.2 %  Comprehensive metabolic panel     Status: Abnormal   Collection Time: 02/27/19 10:53 AM  Result Value Ref Range   Sodium 139 135 - 145 mmol/L   Potassium 3.8 3.5 - 5.1 mmol/L   Chloride 106 98 - 111 mmol/L   CO2 23 22 - 32 mmol/L   Glucose, Bld 116 (H) 70 - 99 mg/dL   BUN 8 6 - 20 mg/dL   Creatinine, Ser 7.82 0.44 - 1.00 mg/dL   Calcium 8.1 (L) 8.9 - 10.3 mg/dL   Total  Protein 5.2 (L) 6.5 - 8.1 g/dL   Albumin 2.2 (L) 3.5 - 5.0 g/dL   AST 45 (H) 15 - 41 U/L   ALT 30 0 - 44 U/L   Alkaline Phosphatase 70 38 - 126 U/L   Total Bilirubin 0.3 0.3 - 1.2 mg/dL   GFR calc non Af Amer >60 >60 mL/min   GFR calc Af Amer >60 >60 mL/min   Anion gap 10 5 - 15  Urinalysis, Routine w reflex microscopic     Status: Abnormal   Collection Time: 02/27/19 10:55 AM  Result Value Ref Range   Color, Urine RED (A) YELLOW   APPearance CLOUDY (A) CLEAR   Specific Gravity, Urine 1.015 1.005 - 1.030   pH 6.0 5.0 - 8.0   Glucose, UA NEGATIVE NEGATIVE mg/dL   Hgb urine dipstick LARGE (A) NEGATIVE   Bilirubin Urine NEGATIVE NEGATIVE   Ketones, ur NEGATIVE NEGATIVE mg/dL   Protein, ur 30 (A) NEGATIVE mg/dL   Nitrite NEGATIVE NEGATIVE   Leukocytes,Ua MODERATE (A) NEGATIVE  Urinalysis, Microscopic (reflex)     Status: Abnormal   Collection Time: 02/27/19 10:55 AM  Result Value Ref Range   RBC / HPF >50 0 - 5 RBC/hpf   WBC, UA 11-20 0 - 5 WBC/hpf   Bacteria, UA FEW (A) NONE SEEN   Squamous Epithelial / LPF 6-10 0 - 5   Meds ordered this encounter  Medications  . metoCLOPramide (REGLAN) injection 10 mg  . diphenhydrAMINE (BENADRYL) injection 25 mg  . dexamethasone (DECADRON) injection 10 mg  . lactated ringers bolus 250 mL   Assessment and Plan  --33 y.o. Postpartum patient, S/p VAVD and BTL --Headache resolved with medications given in MAU --Normotensive on Norvasc --Mild AST elevation, continue to monitor. Otherwise no concerning findings on exam or labs --Urine culture in work --Discharge home in stable condition  F/U: --BP check in one week, postpartum visit in four weeks --Message sent to clinic to add two-week virtual mood check for new mention of history of depression  Calvert Cantor,  CNM 02/27/2019, 2:15 PM

## 2019-02-27 NOTE — Lactation Note (Signed)
This note was copied from a baby's chart. Lactation Consultation Note  Patient Name: Amy Church QMGNO'I Date: 02/27/2019 Reason for consult: Follow-up assessment;Late-preterm 34-36.6wks;1st time breastfeeding;Other (Comment)(per dad mom in MAU - [place] updated per dad - and asked hime to have mom page for [facility]) Baby is 32 hours old  As LC entered the room, baby asleep in the crib on bili- lights asleep.  LC updated the doc flow per dad.  Baby has been receiving bottles and per dad she is undecided whether to breast or formula.   Maternal Data    Feeding Feeding Type: Bottle Fed - Formula Nipple Type: Slow - flow  LATCH Score                   Interventions Interventions: Breast feeding basics reviewed  Lactation Tools Discussed/Used Tools: Pump Breast pump type: Double-Electric Breast Pump   Consult Status Consult Status: Follow-up Date: 02/27/19 Follow-up type: In-patient    [place] 02/27/2019, 12:49 PM

## 2019-02-27 NOTE — MAU Note (Signed)
Pt presents to MAU with with c/o HA that started x 3 days and neck pain x 2 days. She delivered vaginally on 12/26 due to severe pre-eclampsia. She has also noticed increased swelling since delivering.

## 2019-02-27 NOTE — Lactation Note (Signed)
This note was copied from a baby's chart. Lactation Consultation Note  Patient Name: Amy Church KPVVZ'S Date: 02/27/2019 Reason for consult: Follow-up assessment;Hyperbilirubinemia;Primapara;1st time breastfeeding;Late-preterm 34-36.6wks;Infant weight loss;Other (Comment)(D/C pending Bili results)  Baby is 68 hours old 2nd visit mom back from MAU.  Per mom has pumped x 1 in the last 24 hours with small results.  LC reviewed supply and demand and the importance of consistent stimulation to establish and protect her milk supply.  Per mom  undecided whether she will pump / bottle , or breast feeding or just formula feed.  LC reviewed th sluggishness that comes with jaundice.  Sore nipples and engorgement prevention and tx reviewed. Per mom her sister has given her a DEBP and she things its a Medela.  Feedings for the baby is by feeding cues , by 3 hours around the clock.  Mom has the [facility] pamphlet with phone numbers.     Maternal Data    Feeding Feeding Type: Bottle Fed - Formula Nipple Type: Slow - flow  LATCH Score                   Interventions Interventions: Breast feeding basics reviewed  Lactation Tools Discussed/Used Tools: Pump Breast pump type: Double-Electric Breast Pump   Consult Status Consult Status: Complete Date: 02/27/19 Follow-up type: In-patient    [place] 02/27/2019, 1:09 PM

## 2019-02-28 LAB — SURGICAL PATHOLOGY

## 2019-03-01 ENCOUNTER — Encounter (HOSPITAL_COMMUNITY): Payer: Self-pay

## 2019-03-01 ENCOUNTER — Other Ambulatory Visit: Payer: Self-pay

## 2019-03-01 ENCOUNTER — Emergency Department (HOSPITAL_COMMUNITY)
Admission: EM | Admit: 2019-03-01 | Discharge: 2019-03-02 | Discharge status: Against Medical Advice | Payer: Medicaid Other | Attending: Emergency Medicine | Admitting: Emergency Medicine

## 2019-03-01 DIAGNOSIS — I10 Essential (primary) hypertension: Secondary | ICD-10-CM | POA: Insufficient documentation

## 2019-03-01 DIAGNOSIS — Z87891 Personal history of nicotine dependence: Secondary | ICD-10-CM | POA: Diagnosis not present

## 2019-03-01 DIAGNOSIS — E119 Type 2 diabetes mellitus without complications: Secondary | ICD-10-CM | POA: Insufficient documentation

## 2019-03-01 DIAGNOSIS — Z79899 Other long term (current) drug therapy: Secondary | ICD-10-CM | POA: Insufficient documentation

## 2019-03-01 DIAGNOSIS — R519 Headache, unspecified: Secondary | ICD-10-CM | POA: Insufficient documentation

## 2019-03-01 DIAGNOSIS — F1210 Cannabis abuse, uncomplicated: Secondary | ICD-10-CM | POA: Diagnosis not present

## 2019-03-01 LAB — URINALYSIS, ROUTINE W REFLEX MICROSCOPIC
Bilirubin Urine: NEGATIVE
Glucose, UA: NEGATIVE mg/dL
Ketones, ur: NEGATIVE mg/dL
Nitrite: NEGATIVE
Protein, ur: NEGATIVE mg/dL
Specific Gravity, Urine: 1.006 (ref 1.005–1.030)
WBC, UA: >50 WBC/hpf — ABNORMAL HIGH (ref 0–5)
pH: 8 (ref 5.0–8.0)

## 2019-03-01 LAB — CULTURE, OB URINE: Culture: 60000 — AB

## 2019-03-01 MED ORDER — DIPHENHYDRAMINE HCL 50 MG/ML IJ SOLN
25.0000 mg | Freq: Once | INTRAMUSCULAR | Status: AC
  Administered 2019-03-02: 25 mg via INTRAVENOUS
  Filled 2019-03-01: qty 1

## 2019-03-01 MED ORDER — SODIUM CHLORIDE 0.9 % IV BOLUS
1000.0000 mL | Freq: Once | INTRAVENOUS | Status: AC
  Administered 2019-03-01: 1000 mL via INTRAVENOUS

## 2019-03-01 MED ORDER — MAGNESIUM SULFATE 2 GM/50ML IV SOLN
2.0000 g | Freq: Once | INTRAVENOUS | Status: AC
  Administered 2019-03-01: 2 g via INTRAVENOUS
  Filled 2019-03-01: qty 50

## 2019-03-01 MED ORDER — DEXAMETHASONE SODIUM PHOSPHATE 10 MG/ML IJ SOLN
10.0000 mg | Freq: Once | INTRAMUSCULAR | Status: AC
  Administered 2019-03-02: 10 mg via INTRAVENOUS
  Filled 2019-03-01: qty 1

## 2019-03-01 MED ORDER — PROCHLORPERAZINE EDISYLATE 10 MG/2ML IJ SOLN
10.0000 mg | Freq: Once | INTRAMUSCULAR | Status: AC
  Administered 2019-03-02: 10 mg via INTRAVENOUS
  Filled 2019-03-01: qty 2

## 2019-03-01 NOTE — ED Triage Notes (Signed)
Pt had an epidural on 12/26 and then again on 12/27 to deliver her baby. She is complaining of a severe headache that radiates to her neck and upper back. Pt tearful in triage. Denies any complications from the delivery.

## 2019-03-02 ENCOUNTER — Other Ambulatory Visit: Payer: Self-pay | Admitting: Advanced Practice Midwife

## 2019-03-02 LAB — CBC WITH DIFFERENTIAL/PLATELET
Abs Immature Granulocytes: 0.18 10*3/uL — ABNORMAL HIGH (ref 0.00–0.07)
Basophils Absolute: 0 10*3/uL (ref 0.0–0.1)
Basophils Relative: 0 %
Eosinophils Absolute: 0.2 10*3/uL (ref 0.0–0.5)
Eosinophils Relative: 2 %
HCT: 34.2 % — ABNORMAL LOW (ref 36.0–46.0)
Hemoglobin: 11.4 g/dL — ABNORMAL LOW (ref 12.0–15.0)
Immature Granulocytes: 2 %
Lymphocytes Relative: 26 %
Lymphs Abs: 2.6 10*3/uL (ref 0.7–4.0)
MCH: 30.8 pg (ref 26.0–34.0)
MCHC: 33.3 g/dL (ref 30.0–36.0)
MCV: 92.4 fL (ref 80.0–100.0)
Monocytes Absolute: 0.8 10*3/uL (ref 0.1–1.0)
Monocytes Relative: 8 %
Neutro Abs: 6.3 10*3/uL (ref 1.7–7.7)
Neutrophils Relative %: 62 %
Platelets: 496 10*3/uL — ABNORMAL HIGH (ref 150–400)
RBC: 3.7 MIL/uL — ABNORMAL LOW (ref 3.87–5.11)
RDW: 13.3 % (ref 11.5–15.5)
WBC: 10 10*3/uL (ref 4.0–10.5)
nRBC: 0 % (ref 0.0–0.2)

## 2019-03-02 LAB — COMPREHENSIVE METABOLIC PANEL
ALT: 47 U/L — ABNORMAL HIGH (ref 0–44)
AST: 45 U/L — ABNORMAL HIGH (ref 15–41)
Albumin: 3.1 g/dL — ABNORMAL LOW (ref 3.5–5.0)
Alkaline Phosphatase: 72 U/L (ref 38–126)
Anion gap: 13 (ref 5–15)
BUN: 6 mg/dL (ref 6–20)
CO2: 25 mmol/L (ref 22–32)
Calcium: 8.6 mg/dL — ABNORMAL LOW (ref 8.9–10.3)
Chloride: 103 mmol/L (ref 98–111)
Creatinine, Ser: 0.56 mg/dL (ref 0.44–1.00)
GFR calc Af Amer: >60 mL/min (ref >60)
GFR calc non Af Amer: >60 mL/min (ref >60)
Glucose, Bld: 121 mg/dL — ABNORMAL HIGH (ref 70–99)
Potassium: 3.3 mmol/L — ABNORMAL LOW (ref 3.5–5.1)
Sodium: 141 mmol/L (ref 135–145)
Total Bilirubin: 0.3 mg/dL (ref 0.3–1.2)
Total Protein: 6.5 g/dL (ref 6.5–8.1)

## 2019-03-02 LAB — BLOOD GAS, VENOUS
Acid-Base Excess: 3 mmol/L — ABNORMAL HIGH (ref 0.0–2.0)
Bicarbonate: 27.4 mmol/L (ref 20.0–28.0)
O2 Saturation: 85.2 %
Patient temperature: 98.6
pCO2, Ven: 42.7 mmHg — ABNORMAL LOW (ref 44.0–60.0)
pH, Ven: 7.423 (ref 7.250–7.430)
pO2, Ven: 53.9 mmHg — ABNORMAL HIGH (ref 32.0–45.0)

## 2019-03-02 MED ORDER — SULFAMETHOXAZOLE-TRIMETHOPRIM 800-160 MG PO TABS: 1.0000 | ORAL_TABLET | Freq: Two times a day (BID) | ORAL | 1 refills | Status: DC

## 2019-03-02 NOTE — ED Notes (Signed)
Pelvic cart at bedside. 

## 2019-03-02 NOTE — ED Provider Notes (Signed)
[facility]-EMERGENCY DEPT Provider Note  CSN: [id number] Arrival date & time: 03/01/19 2215  Chief Complaint(s) Headache  HPI Amy Church is a 34 y.o. female G4 P1-1-2-2 who is 5 days postpartum that was complicated by preeclampsia with headache and visual changes treated in the hospital.  She presents today for continued headache that began prior to induction.  Patient received 2 epidurals during her hospital stay.  Reports that she was seen after being discharged at the [facility]U for the same headache and treated with migraine cocktail.  Headache had resolved however returned.  Pain is worse with movement.  Located in front.  She is endorsing neck pain.  Denies any fevers.  Endorses nausea without emesis.  No abdominal pain.  No diarrhea.  She reports frequency and states that it feels like she might have a urinary tract infection.  Patient did have GBS bacteriuria but was treated.   HPI  Past Medical History Past Medical History:  Diagnosis Date  . Chronic fungal otitis externa   . Gestational diabetes   . Hypertension    Did not have during previous pregnancy  . Temporomandibular jaw dysfunction    Patient Active Problem List   Diagnosis Date Noted  . Severe preeclampsia 02/24/2019  . PPH (postpartum hemorrhage) 02/24/2019  . Poorly controlled diabetes mellitus (HCC) 02/22/2019  . Excessive fetal growth affecting management of mother in third trimester, antepartum 02/21/2019  . Unwanted fertility 02/09/2019  . GDM (gestational diabetes mellitus) 12/17/2018  . BMI 40.0-44.9, adult (HCC) 10/23/2018  . Obesity in pregnancy 10/23/2018  . GBS bacteriuria 08/28/2018  . Supervision of high risk pregnancy, antepartum 08/07/2018  . Chronic hypertension in pregnancy 08/07/2018   Home Medication(s) Prior to Admission medications   Medication Sig Start Date End Date Taking? Authorizing Provider  amLODipine (NORVASC) 5 MG tablet Take 1 tablet (5 mg total) by mouth  daily. 02/26/19  Yes Conan Bowens, MD  ibuprofen (ADVIL) 600 MG tablet Take 1 tablet (600 mg total) by mouth every 6 (six) hours. 02/26/19  Yes Conan Bowens, MD  prenatal vitamin w/FE, FA (PRENATAL 1 + 1) 27-1 MG TABS tablet Take 1 tablet by mouth daily at 12 noon. 07/31/18  Yes [place] Bing, MD  AMBULATORY NON FORMULARY MEDICATION Blood pressure cuff/Monitored regularly at home.  ICD 10: ZO09.90 08/07/18   Levie Heritage, DO                                                                                                                                    Past Surgical History Past Surgical History:  Procedure Laterality Date  . TUBAL LIGATION Bilateral 02/25/2019   Procedure: POST PARTUM TUBAL LIGATION;  Surgeon: Levie Heritage, DO;  Location: [facility] LD ORS;  Service: Gynecology;  Laterality: Bilateral;  . WISDOM TOOTH EXTRACTION     Family History Family History  Problem Relation Age of Onset  . Heart disease  Paternal Grandmother   . Hypertension Paternal Grandmother   . Diabetes Paternal Grandmother   . Cancer Mother        pelvic    Social History Social History   Tobacco Use  . Smoking status: Former Smoker    Packs/day: 0.25    Types: Cigarettes  . Smokeless tobacco: Never Used  Substance Use Topics  . Alcohol use: Not Currently  . Drug use: Not Currently    Types: Marijuana    Comment: stopped with +UPT   Allergies Penicillins  Review of Systems Review of Systems All other systems are reviewed and are negative for acute change except as noted in the HPI  Physical Exam Vital Signs  I have reviewed the triage vital signs BP 134/88 (BP Location: Left Arm)   Pulse 88   Temp (!) 97.5 F (36.4 C) (Rectal)   Resp 18   SpO2 96%   Physical Exam Vitals reviewed.  Constitutional:      General: She is not in acute distress.    Appearance: She is well-developed. She is obese. She is not diaphoretic.  HENT:     Head: Normocephalic and atraumatic.     Nose: Nose  normal.  Eyes:     General: No scleral icterus.       Right eye: No discharge.        Left eye: No discharge.     Conjunctiva/sclera: Conjunctivae normal.     Pupils: Pupils are equal, round, and reactive to light.  Cardiovascular:     Rate and Rhythm: Normal rate and regular rhythm.     Heart sounds: No murmur. No friction rub. No gallop.   Pulmonary:     Effort: Pulmonary effort is normal. No respiratory distress.     Breath sounds: Normal breath sounds. No stridor. No rales.  Abdominal:     General: There is no distension.     Palpations: Abdomen is soft.     Tenderness: There is no abdominal tenderness.  Genitourinary:    Vagina: No vaginal discharge.     Cervix: Cervical bleeding present. No cervical motion tenderness, discharge, friability or erythema.  Musculoskeletal:        General: No tenderness.     Cervical back: Normal range of motion and neck supple. Muscular tenderness present. Normal range of motion.  Skin:    General: Skin is warm and dry.     Findings: No erythema or rash.  Neurological:     Mental Status: She is alert and oriented to person, place, and time.     Motor: Motor function is intact.     Coordination: Coordination is intact.     Gait: Gait is intact.     ED Results and Treatments Labs (all labs ordered are listed, but only abnormal results are displayed) Labs Reviewed  CBC WITH DIFFERENTIAL/PLATELET - Abnormal; Notable for the following components:      Result Value   RBC 3.70 (*)    Hemoglobin 11.4 (*)    HCT 34.2 (*)    Platelets 496 (*)    Abs Immature Granulocytes 0.18 (*)    All other components within normal limits  COMPREHENSIVE METABOLIC PANEL - Abnormal; Notable for the following components:   Potassium 3.3 (*)    Glucose, Bld 121 (*)    Calcium 8.6 (*)    Albumin 3.1 (*)    AST 45 (*)    ALT 47 (*)    All other components within normal limits  URINALYSIS, ROUTINE W REFLEX MICROSCOPIC - Abnormal; Notable for the following  components:   Color, Urine STRAW (*)    Hgb urine dipstick MODERATE (*)    Leukocytes,Ua MODERATE (*)    WBC, UA >50 (*)    Bacteria, UA RARE (*)    All other components within normal limits  BLOOD GAS, VENOUS - Abnormal; Notable for the following components:   pCO2, Ven 42.7 (*)    pO2, Ven 53.9 (*)    Acid-Base Excess 3.0 (*)    All other components within normal limits  URINE CULTURE                                                                                                                         EKG  EKG Interpretation  Date/Time:    Ventricular Rate:    PR Interval:    QRS Duration:   QT Interval:    QTC Calculation:   R Axis:     Text Interpretation:        Radiology No results found.  Pertinent labs & imaging results that were available during my care of the patient were reviewed by me and considered in my medical decision making (see chart for details).  Medications Ordered in ED Medications  sodium chloride 0.9 % bolus 1,000 mL (0 mLs Intravenous Stopped 03/02/19 0150)  diphenhydrAMINE (BENADRYL) injection 25 mg (25 mg Intravenous Given 03/02/19 0005)  dexamethasone (DECADRON) injection 10 mg (10 mg Intravenous Given 03/02/19 0005)  prochlorperazine (COMPAZINE) injection 10 mg (10 mg Intravenous Given 03/02/19 0005)  magnesium sulfate IVPB 2 g 50 mL (0 g Intravenous Stopped 03/02/19 0015)                                                                                                                                    Procedures Procedures  (including critical care time)  Medical Decision Making / ED Course I have reviewed the nursing notes for this encounter and the patient's prior records (if available in EHR or on provided paperwork).   Amy Church was evaluated in Emergency Department on 03/02/2019 for the symptoms described in the history of present illness. She was evaluated in the context of the global COVID-19 pandemic, which necessitated consideration  that the patient might be at risk for infection with the SARS-CoV-2 virus that causes COVID-19. Institutional protocols and algorithms that pertain to the evaluation of patients  at risk for COVID-19 are in a state of rapid change based on information released by regulatory bodies including the CDC and federal and state organizations. These policies and algorithms were followed during the patient's care in the ED.  Non focal neuro exam. No recent head trauma. No fever. Doubt meningitis. Doubt intracranial bleed. Doubt IIH. No indication for imaging.  Possible post epidural HA. Possible migraine headache. Will r/o recurrent preeclampsia given her history and HTN.   Will treat with migraine cocktail and reevaluate.  Labs not consistent with preeclampsia.  Pelvic exam performed due to blood and WBCs noted on UA.  There is no tenderness or discharge concerning for pelvic infection/endometritis.  There is rare bacteria in the urine.  Will send for culture.  On reevaluation, patient had complete resolution of her headache.  The patient appears reasonably screened and/or stabilized for discharge and I doubt any other medical condition or other [facility] requiring further screening, evaluation, or treatment in the ED at this time prior to discharge.  The patient is safe for discharge with strict return precautions.       Final Clinical Impression(s) / ED Diagnoses Final diagnoses:  Bad headache    The patient appears reasonably screened and/or stabilized for discharge and I doubt any other medical condition or other [facility] requiring further screening, evaluation, or treatment in the ED at this time prior to discharge.  Disposition: Discharge  Condition: Good  I have discussed the results, Dx and Tx plan with the patient who expressed understanding and agree(s) with the plan. Discharge instructions discussed at great length. The patient was given strict return precautions who verbalized understanding of  the instructions. No further questions at time of discharge.   She left prior to receiving DC papers     This chart was dictated using voice recognition software.  Despite best efforts to proofread,  errors can occur which can change the documentation meaning.   Nira Conn, MD 03/02/19 [phone]

## 2019-03-02 NOTE — Progress Notes (Signed)
UTI on culture from MAU visit. Patient confirmed she is bottle feeding. Patient notified via active MyChart account.   Clayton Bibles, MSN, CNM Certified Nurse Midwife, [facility] for [employer], [facility] 03/02/19 4:07 AM

## 2019-03-06 ENCOUNTER — Ambulatory Visit (HOSPITAL_COMMUNITY): Payer: No Typology Code available for payment source

## 2019-03-08 ENCOUNTER — Ambulatory Visit (INDEPENDENT_AMBULATORY_CARE_PROVIDER_SITE_OTHER): Payer: Medicaid Other

## 2019-03-08 ENCOUNTER — Other Ambulatory Visit: Payer: Self-pay

## 2019-03-08 VITALS — BP 129/93 | HR 96 | Wt 279.8 lb

## 2019-03-08 DIAGNOSIS — Z0130 Encounter for examination of blood pressure without abnormal findings: Secondary | ICD-10-CM

## 2019-03-08 MED ORDER — IBUPROFEN 600 MG PO TABS: 600.0000 mg | ORAL_TABLET | Freq: Four times a day (QID) | ORAL | 0 refills | Status: DC

## 2019-03-08 NOTE — Progress Notes (Signed)
Agree with A & P. 

## 2019-03-08 NOTE — Progress Notes (Signed)
Pt here today for BP check.  Pt reports that she has been having headaches and back pain getting the epidural for delivery.  Ibuprofen is effective for the headache which pt requests a refill.  Verified if pt has taken antibiotic for UTI.  Pt states that she did not know about the medication being filled because she did not read her MyChart.  I advised to take antibiotic as that may alleviate some of the back pain she is feeling.  Pt reports that she has not taken Norvasc 5 mg po tablet today.   BP RA 129/92 rpt 129/93 LA.  Dr. Alysia Penna notified.  Provider recommendation to take BP medication as soon gets home and to continue to monitor for sx's of HTN.  Notified pt provider's recommendation and advised pt to call the office if she has a BP that is greater than 140/90.  Pt verbalized understanding.  Addison Naegeli, RN 03/08/19

## 2019-03-08 NOTE — Addendum Note (Signed)
Addended by: Faythe Casa on: 03/08/2019 03:10 PM   Modules accepted: Orders

## 2019-03-12 NOTE — BH Specialist Note (Signed)
Pt did not arrive to video visit and did not answer the phone ; Left HIPPA-compliant message to call back Asher Muir from [facility] at [phone].  ; left MyChart message for patient.    Integrated Behavioral Health via Telemedicine Video Visit  03/12/2019 Jalyric Kaestner [phone]  Rae Lips

## 2019-03-13 ENCOUNTER — Other Ambulatory Visit: Payer: Self-pay

## 2019-03-13 ENCOUNTER — Ambulatory Visit: Payer: Medicaid Other | Admitting: Clinical

## 2019-03-13 DIAGNOSIS — Z91199 Patient's noncompliance with other medical treatment and regimen due to unspecified reason: Secondary | ICD-10-CM

## 2019-03-13 DIAGNOSIS — Z5329 Procedure and treatment not carried out because of patient's decision for other reasons: Secondary | ICD-10-CM

## 2019-03-28 ENCOUNTER — Other Ambulatory Visit: Payer: Self-pay

## 2019-03-28 ENCOUNTER — Ambulatory Visit (INDEPENDENT_AMBULATORY_CARE_PROVIDER_SITE_OTHER): Payer: Medicaid Other | Admitting: Medical

## 2019-03-28 ENCOUNTER — Encounter: Payer: Self-pay | Admitting: Medical

## 2019-03-28 ENCOUNTER — Ambulatory Visit: Payer: Medicaid Other | Admitting: Medical

## 2019-03-28 VITALS — BP 126/95 | HR 96 | Wt 285.3 lb

## 2019-03-28 DIAGNOSIS — O10913 Unspecified pre-existing hypertension complicating pregnancy, third trimester: Secondary | ICD-10-CM

## 2019-03-28 DIAGNOSIS — I10 Essential (primary) hypertension: Secondary | ICD-10-CM

## 2019-03-28 DIAGNOSIS — Z3A36 36 weeks gestation of pregnancy: Secondary | ICD-10-CM | POA: Diagnosis not present

## 2019-03-28 DIAGNOSIS — Z1389 Encounter for screening for other disorder: Secondary | ICD-10-CM | POA: Diagnosis not present

## 2019-03-28 DIAGNOSIS — O10919 Unspecified pre-existing hypertension complicating pregnancy, unspecified trimester: Secondary | ICD-10-CM

## 2019-03-28 DIAGNOSIS — Z8759 Personal history of other complications of pregnancy, childbirth and the puerperium: Secondary | ICD-10-CM | POA: Insufficient documentation

## 2019-03-28 DIAGNOSIS — Z3009 Encounter for other general counseling and advice on contraception: Secondary | ICD-10-CM

## 2019-03-28 MED ORDER — AMLODIPINE BESYLATE 5 MG PO TABS: 5.0000 mg | ORAL_TABLET | Freq: Every day | ORAL | 1 refills | Status: DC

## 2019-03-28 NOTE — Progress Notes (Signed)
Subjective:     Amy Church is a 34 y.o. female who presents for a postpartum visit. She is 4 weeks postpartum following a spontaneous vaginal delivery. I have fully reviewed the prenatal and intrapartum course. The delivery was at 36 gestational weeks. Outcome: spontaneous vaginal delivery. Anesthesia: epidural. Postpartum course has been complicated by UTI in MAU. Baby's course has been normal. Baby is feeding by bottle - [employer]. Bleeding staining only. Bowel function is normal. Bladder function is abnormal: dysuria, frequency, urgency. Patient is not sexually active. Contraception method is tubal ligation. Postpartum depression screening: negative.  The following portions of the patient's history were reviewed and updated as appropriate: allergies, current medications, past family history, past medical history, past social history, past surgical history and problem list.  Review of Systems Pertinent items are noted in HPI.   Objective:    BP (!) 126/95   Pulse 96   Wt 285 lb 4.8 oz (129.4 kg)   Breastfeeding No   BMI 46.05 kg/m   General:  alert and cooperative   Breasts:  not performed  Lungs: clear to auscultation bilaterally  Heart:  regular rate and rhythm, S1, S2 normal, no murmur, click, rub or gallop  Abdomen: normal findings: soft, non-tender   Vulva:  not evaluated  Vagina: not evaluated  Cervix:  not evaluated  Corpus: not examined  Adnexa:  not evaluated  Rectal Exam: Not performed.        Assessment:     Normal postpartum exam. Pap smear not done at today's visit.  Last pap smear was normal 10/2016. Next pap due 10/2019.  CHTN H/O GDM H/O PPH  Plan:    1. Contraception: tubal ligation 2. Refill on Norvasc sent to patient's pharmacy. Advised that when in need of next refill should return to PCP at [facility]  3. Follow up in: 2 weeks for 2 hour GTT and CBC or sooner as needed.    Marny Lowenstein, PA-C 03/28/2019 11:11 AM

## 2019-04-05 ENCOUNTER — Other Ambulatory Visit: Payer: Self-pay | Admitting: *Deleted

## 2019-04-05 DIAGNOSIS — Z8632 Personal history of gestational diabetes: Secondary | ICD-10-CM

## 2019-04-09 ENCOUNTER — Other Ambulatory Visit: Payer: Medicaid Other

## 2019-04-16 ENCOUNTER — Other Ambulatory Visit: Payer: Medicaid Other

## 2019-05-06 IMAGING — CT CT RENAL STONE PROTOCOL
2 of 4 series · 17 of 46 positions shown, 19 images · non-contrast
Comparison: 12/03/2014

CLINICAL DATA: Hematuria

EXAM:
CT ABDOMEN AND PELVIS WITHOUT CONTRAST
TECHNIQUE: Multidetector CT imaging of the abdomen and pelvis was performed
following the standard protocol without IV contrast.

[Series 2: axial st · axial · 0.83mm/px · z∈[-466,-11]mm · 14 of 101 slices shown, 16 images]
[im 5/101  soft-tissue]
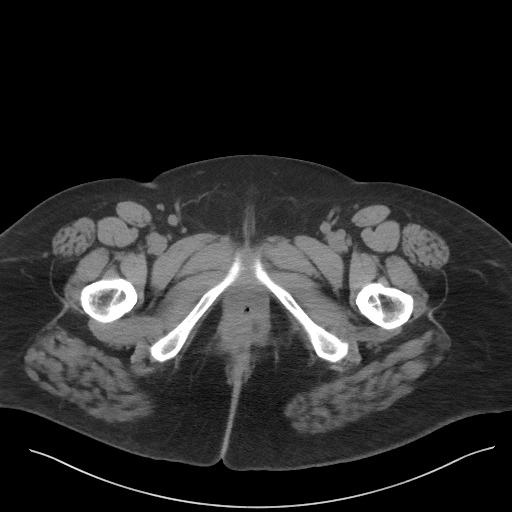
[im 5/101  bone]
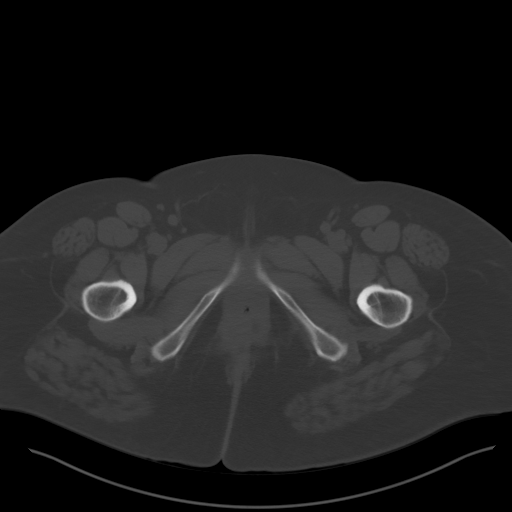
[im 13/101  soft-tissue]
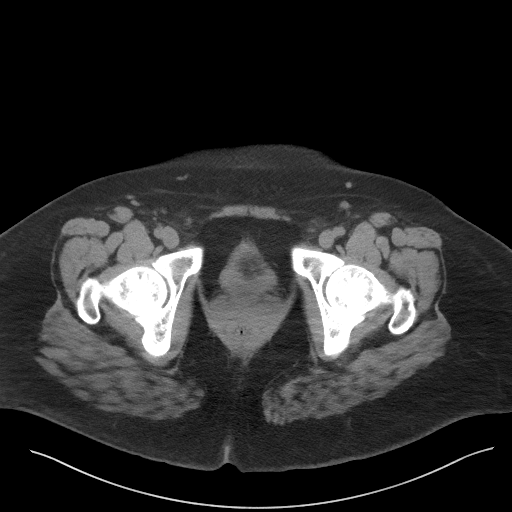
[im 21/101  soft-tissue]
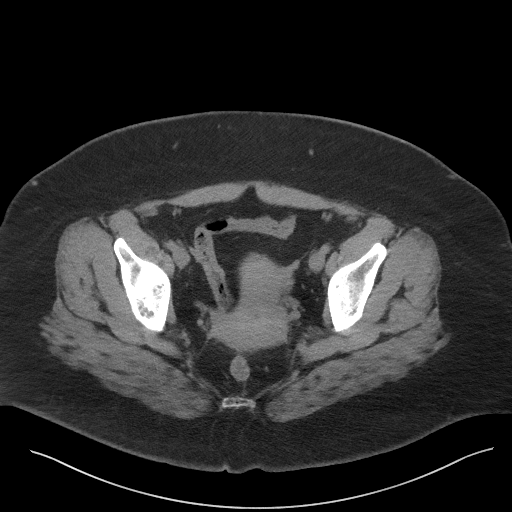
[im 26/101  soft-tissue]
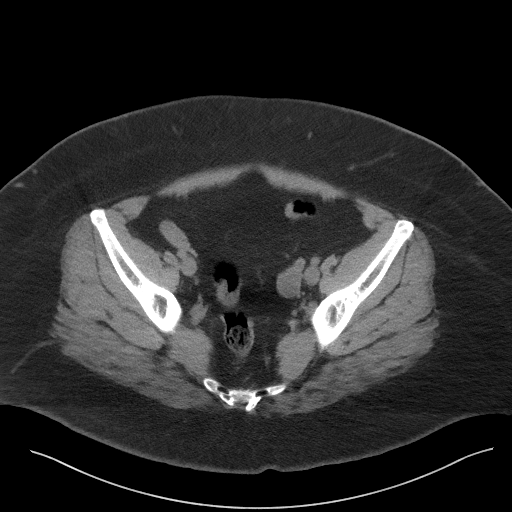
[im 34/101  soft-tissue]
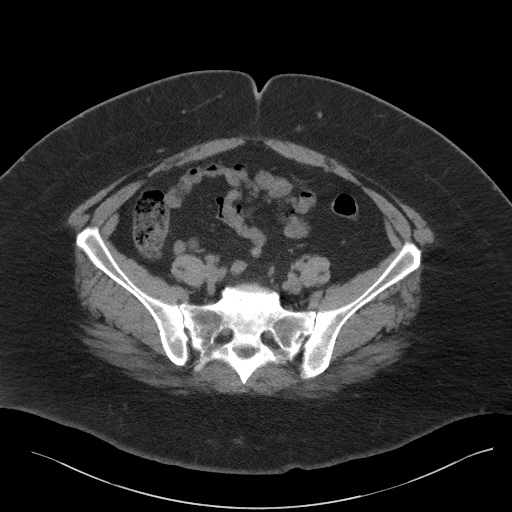
[im 42/101  soft-tissue]
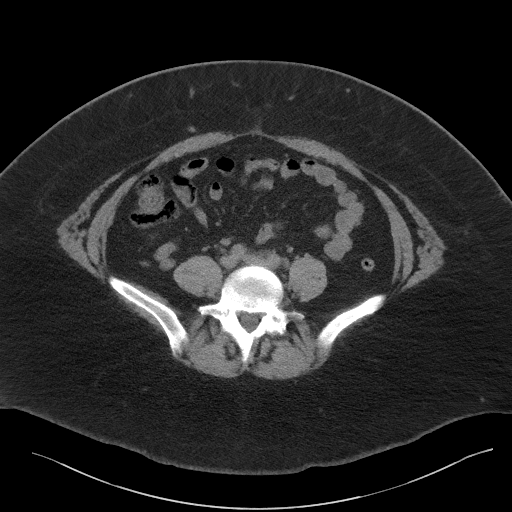
[im 46/101  soft-tissue]
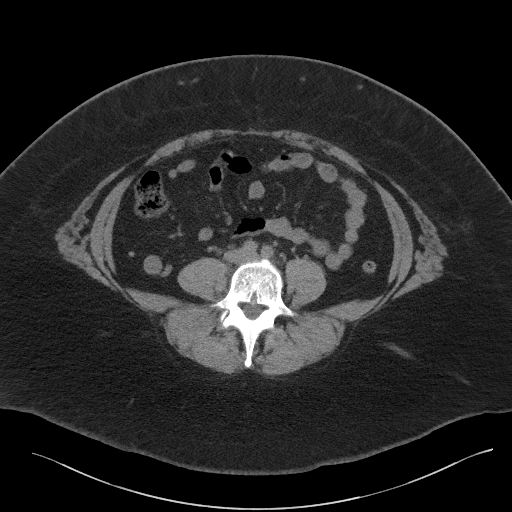
[im 55/101  soft-tissue]
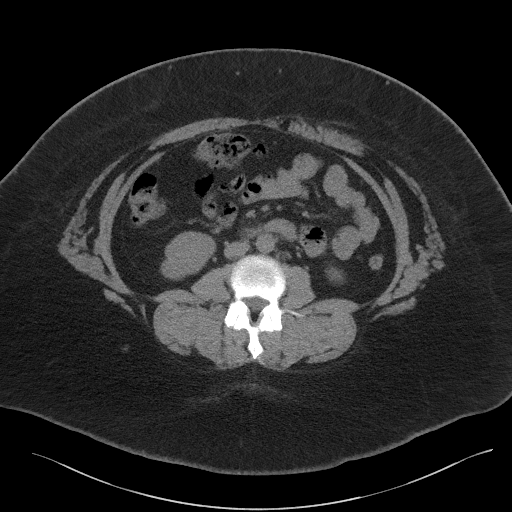
[im 59/101  soft-tissue]
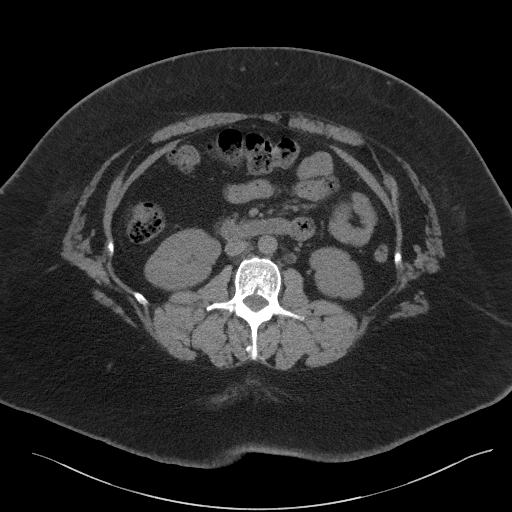
[im 59/101  bone]
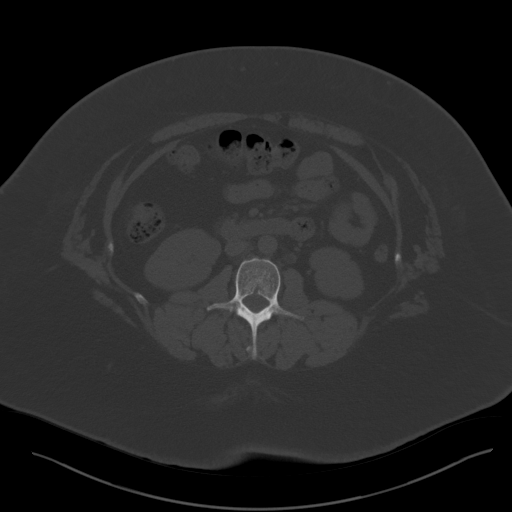
[im 67/101  soft-tissue]
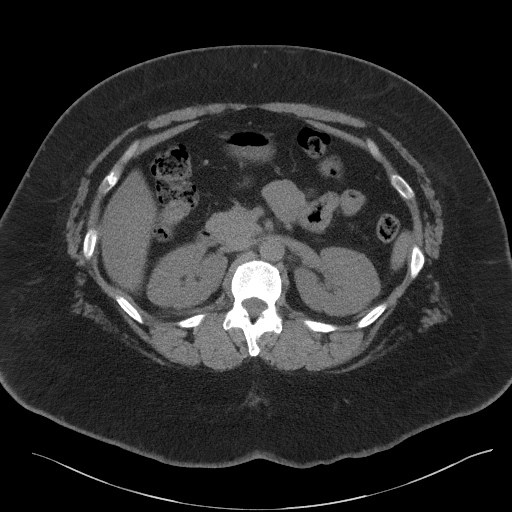
[im 76/101  soft-tissue]
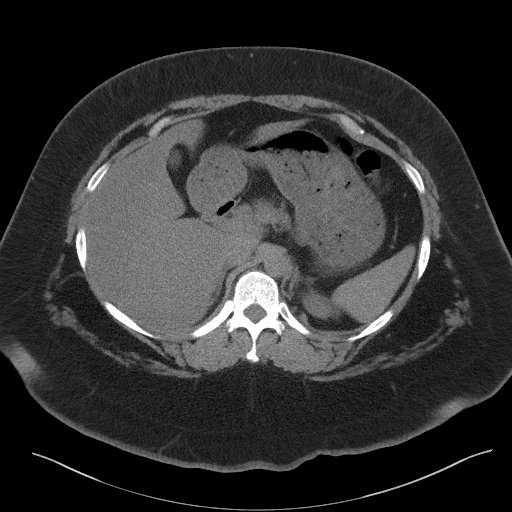
[im 80/101  soft-tissue]
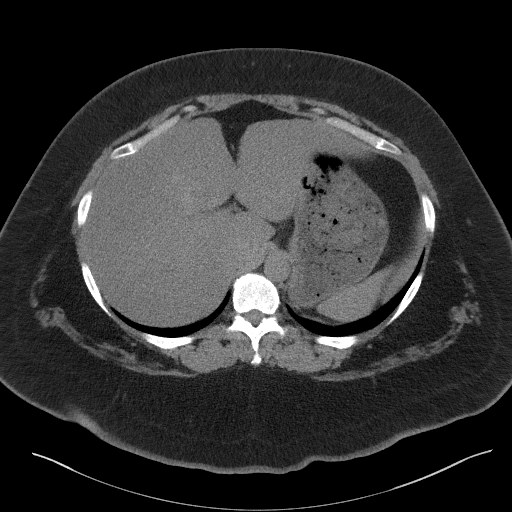
[im 88/101  soft-tissue]
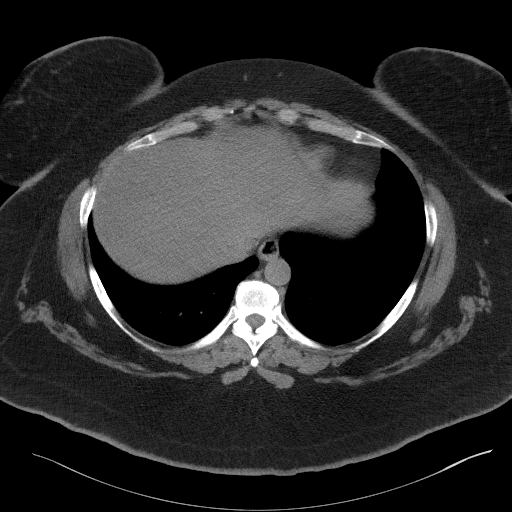
[im 96/101  soft-tissue]
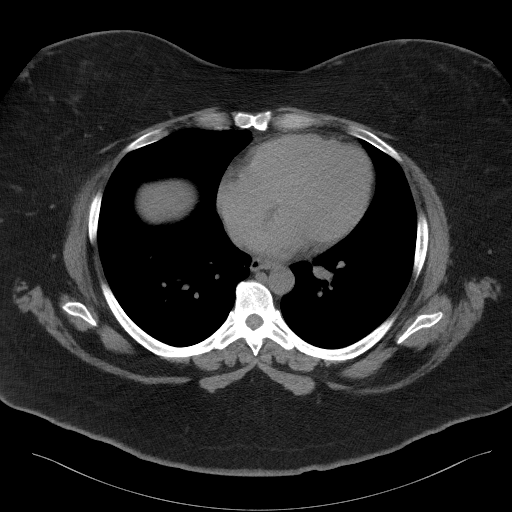

[Series 5: coronal st · coronal · 0.97mm/px · 3 of 122 slices shown]
[im 41/122  soft-tissue]
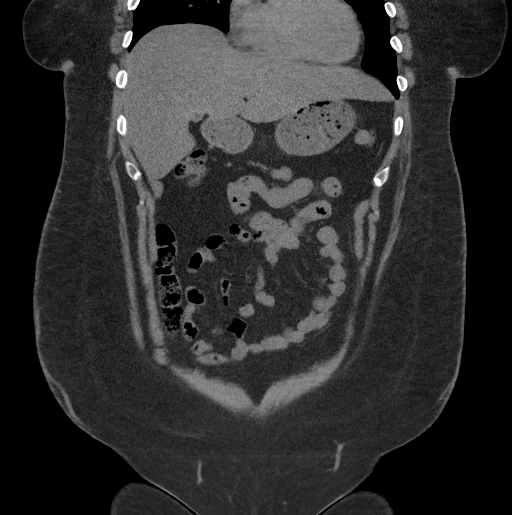
[im 54/122  soft-tissue]
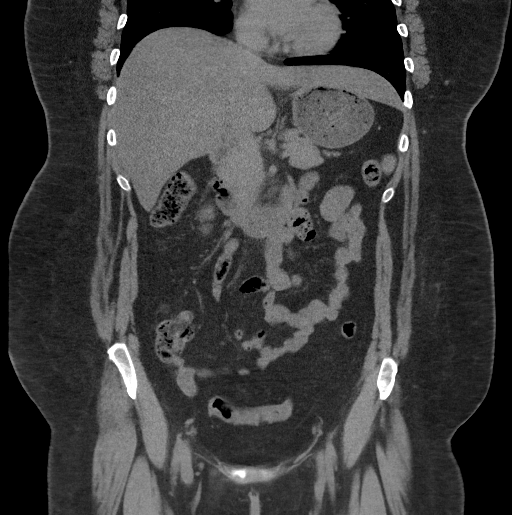
[im 68/122  soft-tissue]
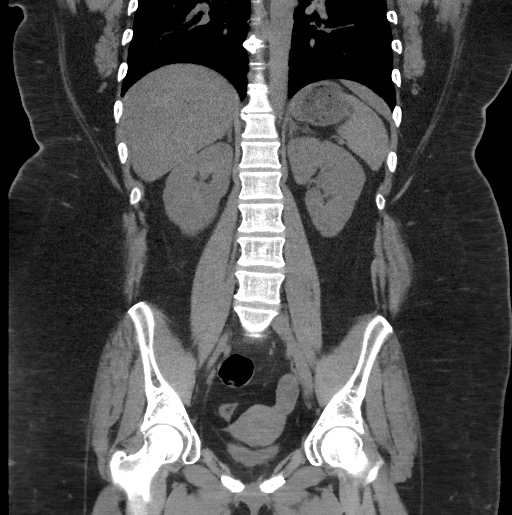

[17 of 46 positions shown; findings below may reference images not displayed]

FINDINGS: Lower chest: No acute abnormality.

Hepatobiliary: Hepatic steatosis. Mild sparing near the gallbladder
fossa. No calcified stones or biliary dilatation

Pancreas: Unremarkable. No pancreatic ductal dilatation or
surrounding inflammatory changes.

Spleen: Normal in size without focal abnormality.

Adrenals/Urinary Tract: Adrenal glands are unremarkable. Kidneys are
normal, without renal calculi, focal lesion, or hydronephrosis.
Bladder is unremarkable.

Stomach/Bowel: Stomach is within normal limits. Appendix appears
normal. No evidence of bowel wall thickening, distention, or
inflammatory changes.

Vascular/Lymphatic: No significant vascular findings are present. No
enlarged abdominal or pelvic lymph nodes.

Reproductive: Uterus and bilateral adnexa are unremarkable.

Other: Negative for free air or free fluid.

Musculoskeletal: No acute or significant osseous findings.
IMPRESSION: 1. Negative for hydronephrosis or ureteral stone.
2. Hepatic steatosis

## 2019-05-22 ENCOUNTER — Encounter (HOSPITAL_COMMUNITY): Payer: Self-pay | Admitting: Emergency Medicine

## 2019-05-22 ENCOUNTER — Ambulatory Visit (HOSPITAL_COMMUNITY)
Admission: EM | Admit: 2019-05-22 | Discharge: 2019-05-22 | Disposition: A | Discharge status: Home / Self Care | Payer: Medicaid Other | Attending: Physician Assistant | Admitting: Physician Assistant

## 2019-05-22 ENCOUNTER — Other Ambulatory Visit: Payer: Self-pay

## 2019-05-22 DIAGNOSIS — G8929 Other chronic pain: Secondary | ICD-10-CM | POA: Diagnosis not present

## 2019-05-22 DIAGNOSIS — M5441 Lumbago with sciatica, right side: Secondary | ICD-10-CM | POA: Diagnosis not present

## 2019-05-22 MED ORDER — CYCLOBENZAPRINE HCL 10 MG PO TABS: 10.0000 mg | ORAL_TABLET | Freq: Two times a day (BID) | ORAL | 0 refills | Status: DC | PRN

## 2019-05-22 MED ORDER — IBUPROFEN 600 MG PO TABS: 600.0000 mg | ORAL_TABLET | Freq: Three times a day (TID) | ORAL | 0 refills | Status: DC

## 2019-05-22 MED ORDER — KETOROLAC TROMETHAMINE 60 MG/2ML IM SOLN
INTRAMUSCULAR | Status: AC
  Filled 2019-05-22: qty 2

## 2019-05-22 MED ORDER — KETOROLAC TROMETHAMINE 60 MG/2ML IM SOLN
60.0000 mg | Freq: Once | INTRAMUSCULAR | Status: AC
  Administered 2019-05-22: 60 mg via INTRAMUSCULAR

## 2019-05-22 NOTE — ED Triage Notes (Signed)
Back pain, pain radiating into right leg.  Pain is in right lower back.  History of back pain.  This episode of back pain started 05/17/2019, radiation into right leg started 05/19/2019. Last night, decided this pain is too bad and must get it checked out

## 2019-05-22 NOTE — Discharge Instructions (Addendum)
Take the ibuprofen 3 times a day.  Take the Flexeril up to twice a day.  This will make you sleepy so I recommend taking this at night.  Utilize the stretching exercises attached.  Please follow-up with the primary care listed for establishment of care and continued monitoring of your low back pain.  Limit the amount of heavy lifting over 20 pounds you do for the next 1 to 2 weeks.

## 2019-05-22 NOTE — ED Provider Notes (Signed)
[place] [facility] [facility]    CSN: [id number] Arrival date & time: 05/22/19  1326      History   Chief Complaint Chief Complaint  Patient presents with  . Back Pain    HPI Amy Church is a 34 y.o. female.   Patient presents with right-sided low back pain that is radiating into her right leg.  She reports this is been present for 4 days.  She reports similar episodes previously.  She describes aching pain in her right side lower back with shooting pain down her right leg.  This is worse with immediate standing or bending actions.  She does endorse some tingling in her toes occasionally.  Denies numbness or weakness in the right leg.  Denies bowel or bladder incontinence.  She denies previous injury to her low back.  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  She is not currently breast-feeding.  She reports similar back pain on 03/01/2019 there is also accompanied by headache which she received a headache medicine cocktail.  She did not receive any pain medicine and that cocktail however this did include Decadron.  She does report a history of gestational diabetes and her blood sugar at that visit was under 23.  She was 1 week postpartum at that point.     Past Medical History:  Diagnosis Date  . Chronic fungal otitis externa   . Gestational diabetes   . Hypertension    Did not have during previous pregnancy  . Temporomandibular jaw dysfunction     Patient Active Problem List   Diagnosis Date Noted  . History of postpartum hemorrhage 03/28/2019  . History of pre-eclampsia 02/24/2019  . Unwanted fertility 02/09/2019  . History of gestational diabetes 12/17/2018  . BMI 40.0-44.9, adult ([place]) 10/23/2018  . Chronic hypertension 08/07/2018    Past Surgical History:  Procedure Laterality Date  . TUBAL LIGATION Bilateral 02/25/2019   Procedure: POST PARTUM TUBAL LIGATION;  Surgeon: Truett Mainland, DO;  Location: [facility];  Service: Gynecology;  Laterality:  Bilateral;  . WISDOM TOOTH EXTRACTION      OB History    Gravida  4   Para  2   Term  1   Preterm  1   AB  2   Living  2     SAB      TAB  2   Ectopic      Multiple  0   Live Births  2            Home Medications    Prior to Admission medications   Medication Sig Start Date End Date Taking? Authorizing Provider  amLODipine (NORVASC) 5 MG tablet Take 1 tablet (5 mg total) by mouth daily. 03/28/19  Yes Luvenia Redden, PA-C  AMBULATORY NON FORMULARY MEDICATION Blood pressure cuff/Monitored regularly at home.  ICD 10: ZO09.90 08/07/18   Truett Mainland, DO  cyclobenzaprine (FLEXERIL) 10 MG tablet Take 1 tablet (10 mg total) by mouth 2 (two) times daily as needed for muscle spasms. 05/22/19   Kazim Corrales, Marguerita Beards, PA-C  ibuprofen (ADVIL) 600 MG tablet Take 1 tablet (600 mg total) by mouth 3 (three) times daily. 05/22/19   Lavida Patch, Marguerita Beards, PA-C  prenatal vitamin w/FE, FA (PRENATAL 1 + 1) 27-1 MG TABS tablet Take 1 tablet by mouth daily at 12 noon. Patient not taking: Reported on 03/08/2019 07/31/18   Aletha Halim, MD  sulfamethoxazole-trimethoprim (BACTRIM DS) 800-160 MG tablet Take 1 tablet  by mouth 2 (two) times daily. Patient not taking: Reported on 03/08/2019 03/02/19   Calvert Cantor, CNM    Family History Family History  Problem Relation Age of Onset  . Heart disease Paternal Grandmother   . Hypertension Paternal Grandmother   . Diabetes Paternal Grandmother   . Cancer Mother        pelvic    Social History Social History   Tobacco Use  . Smoking status: Former Smoker    Packs/day: 0.25    Types: Cigarettes  . Smokeless tobacco: Never Used  Substance Use Topics  . Alcohol use: Not Currently  . Drug use: Not Currently    Types: Marijuana    Comment: stopped with +UPT     Allergies   Penicillins   Review of Systems Review of Systems  Constitutional: Negative for fever.  Musculoskeletal: Positive for back pain and gait problem.  Skin: Negative for  color change and rash.  Neurological: Negative for weakness and numbness.  All other systems reviewed and are negative.    Physical Exam Triage Vital Signs ED Triage Vitals  Enc Vitals Group     BP 05/22/19 1434 (!) 139/95     Pulse Rate 05/22/19 1434 89     Resp 05/22/19 1434 16     Temp 05/22/19 1434 98.6 F (37 C)     Temp Source 05/22/19 1434 Oral     SpO2 05/22/19 1434 100 %     Weight --      Height --      Head Circumference --      Peak Flow --      Pain Score 05/22/19 1436 10     Pain Loc --      Pain Edu? --      Excl. in GC? --    No data found.  Updated Vital Signs BP (!) 139/95 (BP Location: Left Arm)   Pulse 89   Temp 98.6 F (37 C) (Oral)   Resp 16   LMP 04/02/2019   SpO2 100%   Visual Acuity Right Eye Distance:   Left Eye Distance:   Bilateral Distance:    Right Eye Near:   Left Eye Near:    Bilateral Near:     Physical Exam Vitals and nursing note reviewed.  Constitutional:      General: She is not in acute distress.    Appearance: She is well-developed. She is obese. She is not ill-appearing.  HENT:     Head: Normocephalic and atraumatic.  Eyes:     Conjunctiva/sclera: Conjunctivae normal.  Cardiovascular:     Rate and Rhythm: Normal rate.  Pulmonary:     Effort: Pulmonary effort is normal. No respiratory distress.  Musculoskeletal:     Cervical back: Neck supple.     Comments: Tenderness over lumbar paraspinal musculature at the SI joint.  Straight leg raise positive on right accompanied by flip test positive.  Negative straight leg raise on the left.  Patient with reported pain on rising from seated position.  Skin:    General: Skin is warm and dry.  Neurological:     General: No focal deficit present.     Mental Status: She is alert and oriented to person, place, and time.     Comments: Sensation grossly intact bilaterally.  Strength equal bilaterally      UC Treatments / Results  Labs (all labs ordered are listed, but  only abnormal results are displayed) Labs Reviewed - No data  to display  EKG   Radiology No results found.  Procedures Procedures (including critical care time)  Medications Ordered in UC Medications  ketorolac (TORADOL) injection 60 mg (has no administration in time range)    Initial Impression / Assessment and Plan / UC Course  I have reviewed the triage vital signs and the nursing notes.  Pertinent labs & imaging results that were available during my care of the patient were reviewed by me and considered in my medical decision making (see chart for details).     #Acute back pain with sciatica. Patient 34 year old presenting with acute back pain with sciatica symptoms.  She is neurologically intact at this time without alarm symptoms.  Elected for Toradol injection in clinic versus steroid injection as history of high blood sugar secondary to pregnancy.  Will send out with ibuprofen scheduled and Flexeril muscle relaxer.  Primary care follow-up was instructed and resources given. -Rehab packet given and instructed to start the stretching. -Instructed to limit lifting over 20 pounds for the next 1 to 2 weeks.  Final Clinical Impressions(s) / UC Diagnoses   Final diagnoses:  Chronic right-sided low back pain with right-sided sciatica     Discharge Instructions     Take the ibuprofen 3 times a day.  Take the Flexeril up to twice a day.  This will make you sleepy so I recommend taking this at night.  Utilize the stretching exercises attached.  Please follow-up with the primary care listed for establishment of care and continued monitoring of your low back pain.  Limit the amount of heavy lifting over 20 pounds you do for the next 1 to 2 weeks.       ED Prescriptions    Medication Sig Dispense Auth. Provider   ibuprofen (ADVIL) 600 MG tablet Take 1 tablet (600 mg total) by mouth 3 (three) times daily. 30 tablet Trecia Maring, Veryl Speak, PA-C   cyclobenzaprine (FLEXERIL) 10 MG  tablet Take 1 tablet (10 mg total) by mouth 2 (two) times daily as needed for muscle spasms. 20 tablet Daneshia Tavano, Veryl Speak, PA-C     PDMP not reviewed this encounter.   Hermelinda Medicus, PA-C 05/22/19 1525

## 2019-05-31 ENCOUNTER — Other Ambulatory Visit: Payer: Self-pay

## 2019-05-31 ENCOUNTER — Ambulatory Visit (HOSPITAL_COMMUNITY)
Admission: EM | Admit: 2019-05-31 | Discharge: 2019-05-31 | Disposition: A | Discharge status: Home / Self Care | Payer: Medicaid Other | Attending: Family Medicine | Admitting: Family Medicine

## 2019-05-31 ENCOUNTER — Encounter (HOSPITAL_COMMUNITY): Payer: Self-pay

## 2019-05-31 DIAGNOSIS — Z87891 Personal history of nicotine dependence: Secondary | ICD-10-CM | POA: Insufficient documentation

## 2019-05-31 DIAGNOSIS — R0602 Shortness of breath: Secondary | ICD-10-CM

## 2019-05-31 DIAGNOSIS — Z20822 Contact with and (suspected) exposure to covid-19: Secondary | ICD-10-CM | POA: Diagnosis not present

## 2019-05-31 DIAGNOSIS — R52 Pain, unspecified: Secondary | ICD-10-CM

## 2019-05-31 DIAGNOSIS — R05 Cough: Secondary | ICD-10-CM | POA: Insufficient documentation

## 2019-05-31 DIAGNOSIS — R5383 Other fatigue: Secondary | ICD-10-CM | POA: Diagnosis not present

## 2019-05-31 DIAGNOSIS — U071 COVID-19: Secondary | ICD-10-CM | POA: Diagnosis not present

## 2019-05-31 DIAGNOSIS — R509 Fever, unspecified: Secondary | ICD-10-CM

## 2019-05-31 DIAGNOSIS — I10 Essential (primary) hypertension: Secondary | ICD-10-CM | POA: Diagnosis not present

## 2019-05-31 DIAGNOSIS — R112 Nausea with vomiting, unspecified: Secondary | ICD-10-CM

## 2019-05-31 MED ORDER — HYDROCODONE-HOMATROPINE 5-1.5 MG/5ML PO SYRP: 5.0000 mL | ORAL_SOLUTION | Freq: Four times a day (QID) | ORAL | 0 refills | Status: DC | PRN

## 2019-05-31 MED ORDER — ONDANSETRON 4 MG PO TBDP: 4.0000 mg | ORAL_TABLET | Freq: Three times a day (TID) | ORAL | 0 refills | Status: DC | PRN

## 2019-05-31 MED ORDER — IBUPROFEN 800 MG PO TABS: 800.0000 mg | ORAL_TABLET | Freq: Three times a day (TID) | ORAL | 0 refills | Status: DC

## 2019-05-31 NOTE — Discharge Instructions (Addendum)
You have been tested for COVID-19 today. If your test returns positive, you will receive a phone call from [facility] regarding your results. Negative test results are not called. Both positive and negative results area always visible on MyChart. If you do not have a MyChart account, sign up instructions are provided in your discharge papers. Please do not hesitate to contact us should you have questions or concerns.  Please do your best to ensure adequate fluid intake in order to avoid dehydration. If you find that you are unable to tolerate drinking fluids regularly please proceed to the Emergency Department for evaluation.  Also, you should return to the hospital if you experience persistent fevers for greater than 1-2 more days, increasing abdominal pain that persists despite medications, persistent diarrhea, dizziness, syncope (fainting), or for any other concerns you may find worrisome.  Be aware, your cough medication may cause drowsiness. Please do not drive, operate heavy machinery or make important decisions while on this medication, it can cloud your judgement.

## 2019-05-31 NOTE — ED Triage Notes (Signed)
Pt c/o N/V, SOB, body aches, fever of 102 at home, loss of taste and smellx2 days.

## 2019-05-31 NOTE — ED Provider Notes (Signed)
[facility]   [id number] 05/31/19 Arrival Time: 1211  ASSESSMENT & PLAN:  1. Fever and chills   2. Intractable vomiting with nausea, unspecified vomiting type   3. SOB (shortness of breath)   4. Generalized body aches     Discussed typical duration of viral illnesses. COVID-19 testing sent. See letter/work note on file for self-isolation guidelines.   Meds ordered this encounter  Medications  . ondansetron (ZOFRAN-ODT) 4 MG disintegrating tablet    Sig: Take 1 tablet (4 mg total) by mouth every 8 (eight) hours as needed for nausea or vomiting.    Dispense:  15 tablet    Refill:  0  . HYDROcodone-homatropine (HYCODAN) 5-1.5 MG/5ML syrup    Sig: Take 5 mLs by mouth every 6 (six) hours as needed for cough.    Dispense:  90 mL    Refill:  0  . ibuprofen (ADVIL) 800 MG tablet    Sig: Take 1 tablet (800 mg total) by mouth 3 (three) times daily with meals.    Dispense:  21 tablet    Refill:  0     Follow-up Information    [facility].   Specialty: Urgent Care Why: If worsening or failing to improve as anticipated. Contact information: [address] [place] [address] [phone]          Reviewed expectations re: course of current medical issues. Questions answered. Outlined signs and symptoms indicating need for more acute intervention. Understanding verbalized. After Visit Summary given.   SUBJECTIVE: History from: patient. Amy Church is a 34 y.o. female who requests COVID-19 testing. Reports abrupt onset of fever (102 degrees F), body aches, significant coughing, along with n/v. Overall fatigued. Abrupt onset two days ago. Known COVID-19 contact: none. Recent travel: none. Denies: difficulty breathing and headache. No diarrhea. Ambulatory without difficulty.    OBJECTIVE:  Vitals:   05/31/19 1224 05/31/19 1225  BP: (!) 128/97   Pulse: (!) 108   Resp: 20   Temp: 99.3 F (37.4 C)    TempSrc: Oral   SpO2: 99%   Weight:  127 kg  Height:  5\' 6"  (1.676 m)    Slight tachycardia noted.  General appearance: alert; no distress but appears fatigued Eyes: PERRLA; EOMI; conjunctiva normal HENT: [place]; AT; nasal congestion Neck: supple  Lungs: speaks full sentences without difficulty; unlabored Extremities: no edema Skin: warm and dry Neurologic: normal gait Psychological: alert and cooperative; normal mood and affect  Labs: Labs Reviewed  SARS CORONAVIRUS 2 (TAT 6-24 HRS)      Allergies  Allergen Reactions  . Penicillins Hives, Shortness Of Breath and Swelling    Has patient had a PCN reaction causing immediate rash, facial/tongue/throat swelling, SOB or lightheadedness with hypotension: yes Has patient had a PCN reaction causing severe rash involving mucus membranes or skin necrosis: no Has patient had a PCN reaction that required hospitalization yes Has patient had a PCN reaction occurring within the last 10 years: yes If all of the above answers are "NO", then may proceed with Cephalosporin use.     Past Medical History:  Diagnosis Date  . Chronic fungal otitis externa   . Gestational diabetes   . Hypertension    Did not have during previous pregnancy  . Temporomandibular jaw dysfunction    Social History   Socioeconomic History  . Marital status: Single    Spouse name: Not on file  . Number of children: Not on file  .  Years of education: Not on file  . Highest education level: Not on file  Occupational History    Comment: unemployed  Tobacco Use  . Smoking status: Former Smoker    Packs/day: 0.25    Types: Cigarettes  . Smokeless tobacco: Never Used  Substance and Sexual Activity  . Alcohol use: Not Currently  . Drug use: Not Currently    Types: Marijuana    Comment: stopped with +UPT  . Sexual activity: Yes    Birth control/protection: None  Other Topics Concern  . Not on file  Social History Narrative  . Not on file   Social  Determinants of Health   Financial Resource Strain:   . Difficulty of Paying Living Expenses:   Food Insecurity: No Food Insecurity  . Worried About Charity fundraiser in the Last Year: Never true  . Ran Out of Food in the Last Year: Never true  Transportation Needs: No Transportation Needs  . Lack of Transportation (Medical): No  . Lack of Transportation (Non-Medical): No  Physical Activity:   . Days of Exercise per Week:   . Minutes of Exercise per Session:   Stress:   . Feeling of Stress :   Social Connections:   . Frequency of Communication with Friends and Family:   . Frequency of Social Gatherings with Friends and Family:   . Attends Religious Services:   . Active Member of Clubs or Organizations:   . Attends Archivist Meetings:   Marland Kitchen Marital Status:   Intimate Partner Violence: Not At Risk  . Fear of Current or Ex-Partner: No  . Emotionally Abused: No  . Physically Abused: No  . Sexually Abused: No   Family History  Problem Relation Age of Onset  . Heart disease Paternal Grandmother   . Hypertension Paternal Grandmother   . Diabetes Paternal Grandmother   . Cancer Mother        pelvic   Past Surgical History:  Procedure Laterality Date  . TUBAL LIGATION Bilateral 02/25/2019   Procedure: POST PARTUM TUBAL LIGATION;  Surgeon: Truett Mainland, DO;  Location: [facility];  Service: Gynecology;  Laterality: Bilateral;  . WISDOM TOOTH EXTRACTION       Vanessa Kick, MD 05/31/19 1249

## 2019-06-01 LAB — SARS CORONAVIRUS 2 (TAT 6-24 HRS): SARS Coronavirus 2: POSITIVE — AB

## 2019-06-02 ENCOUNTER — Telehealth: Payer: Self-pay | Admitting: Physician Assistant

## 2019-06-02 NOTE — Telephone Encounter (Signed)
Called to discuss with patient about Covid symptoms and the use of bamlanivimab or casirivimab/imdevimab, a monoclonal antibody infusion for those with mild to moderate Covid symptoms and at a high risk of hospitalization.  Pt is qualified for this infusion at the [facility] infusion center due to [employer] left to call back and Mychart message   Cline Crock PA-C  [facility]

## 2019-07-24 ENCOUNTER — Telehealth: Payer: Self-pay | Admitting: Lactation Services

## 2019-07-24 NOTE — Telephone Encounter (Signed)
Patient called and is requesting her TB results. There is no record that patient has had a TB test here. She does however have record of a Tdap. Tdap printed to be mailed to patient.

## 2020-02-11 ENCOUNTER — Ambulatory Visit (HOSPITAL_COMMUNITY)
Admission: EM | Admit: 2020-02-11 | Discharge: 2020-02-11 | Disposition: A | Discharge status: Home / Self Care | Payer: Medicaid Other | Attending: Family Medicine | Admitting: Family Medicine

## 2020-02-11 ENCOUNTER — Encounter (HOSPITAL_COMMUNITY): Payer: Self-pay

## 2020-02-11 ENCOUNTER — Other Ambulatory Visit: Payer: Self-pay

## 2020-02-11 DIAGNOSIS — G8929 Other chronic pain: Secondary | ICD-10-CM

## 2020-02-11 DIAGNOSIS — M5441 Lumbago with sciatica, right side: Secondary | ICD-10-CM | POA: Diagnosis not present

## 2020-02-11 MED ORDER — CYCLOBENZAPRINE HCL 5 MG PO TABS
5.0000 mg | ORAL_TABLET | Freq: Three times a day (TID) | ORAL | 0 refills | Status: DC | PRN
Start: 2020-02-11 — End: 2021-08-01

## 2020-02-11 MED ORDER — IBUPROFEN 800 MG PO TABS: 800.0000 mg | ORAL_TABLET | Freq: Three times a day (TID) | ORAL | 0 refills | Status: DC

## 2020-02-11 MED ORDER — KETOROLAC TROMETHAMINE 60 MG/2ML IM SOLN
60.0000 mg | Freq: Once | INTRAMUSCULAR | Status: AC
  Administered 2020-02-11: 11:00:00 60 mg via INTRAMUSCULAR

## 2020-02-11 NOTE — ED Provider Notes (Signed)
[facility]    CSN: [id number] Arrival date & time: 02/11/20  7124      History   Chief Complaint Chief Complaint  Patient presents with  . Back Pain    HPI Amy Church is a 34 y.o. female.   Here today with acute on chronic right sided low back pain with radiation partly down back of right leg. States this has been an issue since her recent pregnancy last year. Does home stretches and takes OTC pain relievers without much relief. Notes working and caring for her daughter tend to flare things up with extra activity here and there. Denies numbness, tingling, weakness, bowel or bladder incontinence.      Past Medical History:  Diagnosis Date  . Chronic fungal otitis externa   . Gestational diabetes   . Hypertension    Did not have during previous pregnancy  . Temporomandibular jaw dysfunction     Patient Active Problem List   Diagnosis Date Noted  . History of postpartum hemorrhage 03/28/2019  . History of pre-eclampsia 02/24/2019  . Unwanted fertility 02/09/2019  . History of gestational diabetes 12/17/2018  . BMI 40.0-44.9, adult ([facility]) 10/23/2018  . Chronic hypertension 08/07/2018    Past Surgical History:  Procedure Laterality Date  . TUBAL LIGATION Bilateral 02/25/2019   Procedure: POST PARTUM TUBAL LIGATION;  Surgeon: Levie Heritage, DO;  Location: [facility];  Service: Gynecology;  Laterality: Bilateral;  . WISDOM TOOTH EXTRACTION      OB History    Gravida  4   Para  2   Term  1   Preterm  1   AB  2   Living  2     SAB      IAB  2   Ectopic      Multiple  0   Live Births  2            Home Medications    Prior to Admission medications   Medication Sig Start Date End Date Taking? Authorizing Provider  AMBULATORY NON FORMULARY MEDICATION Blood pressure cuff/Monitored regularly at home.  ICD 10: ZO09.90 08/07/18   Levie Heritage, DO  amLODipine (NORVASC) 5 MG tablet Take 1 tablet (5 mg total) by mouth daily.  03/28/19   Marny Lowenstein, PA-C  cyclobenzaprine (FLEXERIL) 5 MG tablet Take 1 tablet (5 mg total) by mouth 3 (three) times daily as needed for muscle spasms. DO NOT DRINK ALCOHOL OR DRIVE WHILE TAKING THIS MEDICATION 02/11/20   Particia Nearing, PA-C  HYDROcodone-homatropine [facility]) 5-1.5 MG/5ML syrup Take 5 mLs by mouth every 6 (six) hours as needed for cough. 05/31/19   Mardella Layman, MD  ibuprofen (ADVIL) 800 MG tablet Take 1 tablet (800 mg total) by mouth 3 (three) times daily with meals. 02/11/20   Particia Nearing, PA-C  ondansetron (ZOFRAN-ODT) 4 MG disintegrating tablet Take 1 tablet (4 mg total) by mouth every 8 (eight) hours as needed for nausea or vomiting. 05/31/19   Mardella Layman, MD    Family History Family History  Problem Relation Age of Onset  . Heart disease Paternal Grandmother   . Hypertension Paternal Grandmother   . Diabetes Paternal Grandmother   . Cancer Mother        pelvic    Social History Social History   Tobacco Use  . Smoking status: Former Smoker    Packs/day: 0.25    Types: Cigarettes  . Smokeless tobacco: Never Used  Vaping Use  .  Vaping Use: Never used  Substance Use Topics  . Alcohol use: Not Currently  . Drug use: Not Currently    Types: Marijuana    Comment: stopped with +UPT     Allergies   Penicillins   Review of Systems Review of Systems PER HPI    Physical Exam Triage Vital Signs ED Triage Vitals  Enc Vitals Group     BP 02/11/20 1008 (!) 137/93     Pulse Rate 02/11/20 1008 89     Resp 02/11/20 1008 20     Temp --      Temp src --      SpO2 02/11/20 1008 99 %     Weight --      Height --      Head Circumference --      Peak Flow --      Pain Score 02/11/20 0955 10     Pain Loc --      Pain Edu? --      Excl. in GC? --    No data found.  Updated Vital Signs BP (!) 137/93 (BP Location: Right Arm)   Pulse 89   Resp 20   LMP 01/27/2020   SpO2 99%   Breastfeeding No   Visual Acuity Right Eye  Distance:   Left Eye Distance:   Bilateral Distance:    Right Eye Near:   Left Eye Near:    Bilateral Near:     Physical Exam Vitals and nursing note reviewed.  Constitutional:      Appearance: Normal appearance. She is not ill-appearing.  HENT:     Head: Atraumatic.  Eyes:     Extraocular Movements: Extraocular movements intact.     Conjunctiva/sclera: Conjunctivae normal.  Cardiovascular:     Rate and Rhythm: Normal rate and regular rhythm.     Pulses: Normal pulses.     Heart sounds: Normal heart sounds.  Pulmonary:     Effort: Pulmonary effort is normal.     Breath sounds: Normal breath sounds.  Abdominal:     General: Bowel sounds are normal. There is no distension.     Palpations: Abdomen is soft.     Tenderness: There is no abdominal tenderness. There is no right CVA tenderness, left CVA tenderness or guarding.  Musculoskeletal:        General: Tenderness (right lateral lumbar paraspinal ttp extending down to lateral right hip and posterior upper leg) present. No swelling or deformity. Normal range of motion.     Cervical back: Normal range of motion and neck supple.     Comments: - SLR b/l   Skin:    General: Skin is warm and dry.  Neurological:     Mental Status: She is alert and oriented to person, place, and time.     Sensory: No sensory deficit.     Motor: No weakness.     Gait: Gait normal.  Psychiatric:        Mood and Affect: Mood normal.        Thought Content: Thought content normal.        Judgment: Judgment normal.      UC Treatments / Results  Labs (all labs ordered are listed, but only abnormal results are displayed) Labs Reviewed - No data to display  EKG   Radiology No results found.  Procedures Procedures (including critical care time)  Medications Ordered in UC Medications  ketorolac (TORADOL) injection 60 mg (60 mg Intramuscular Given 02/11/20 1044)  Initial Impression / Assessment and Plan / UC Course  I have reviewed  the triage vital signs and the nursing notes.  Pertinent labs & imaging results that were available during my care of the patient were reviewed by me and considered in my medical decision making (see chart for details).     Exam overall reassuring, and patient states this is consistent with her typical flares which are helped significantly with IM toradol, home OTC pain relievers, and muscle relaxers. Will treat with this protocol, discussed home stretches, weight loss, core strengthening. F/u if not resolving or worsening.   Final Clinical Impressions(s) / UC Diagnoses   Final diagnoses:  Chronic right-sided low back pain with right-sided sciatica   Discharge Instructions   None    ED Prescriptions    Medication Sig Dispense Auth. Provider   ibuprofen (ADVIL) 800 MG tablet Take 1 tablet (800 mg total) by mouth 3 (three) times daily with meals. 60 tablet Particia Nearing, [place]   cyclobenzaprine (FLEXERIL) 5 MG tablet Take 1 tablet (5 mg total) by mouth 3 (three) times daily as needed for muscle spasms. DO NOT DRINK ALCOHOL OR DRIVE WHILE TAKING THIS MEDICATION 30 tablet Particia Nearing, [place]     PDMP not reviewed this encounter.   Particia Nearing, [place] 02/11/20 1155

## 2020-02-11 NOTE — ED Triage Notes (Signed)
Pt in with c/o lower back pain that is recurrent.  States that she has not had medication for sxs

## 2020-03-07 ENCOUNTER — Ambulatory Visit (HOSPITAL_COMMUNITY)
Admission: EM | Admit: 2020-03-07 | Discharge: 2020-03-07 | Disposition: A | Discharge status: Home / Self Care | Payer: Medicaid Other | Attending: Physician Assistant | Admitting: Physician Assistant

## 2020-03-07 ENCOUNTER — Other Ambulatory Visit: Payer: Self-pay

## 2020-03-07 ENCOUNTER — Encounter (HOSPITAL_COMMUNITY): Payer: Self-pay

## 2020-03-07 DIAGNOSIS — I10 Essential (primary) hypertension: Secondary | ICD-10-CM | POA: Insufficient documentation

## 2020-03-07 DIAGNOSIS — Z20822 Contact with and (suspected) exposure to covid-19: Secondary | ICD-10-CM | POA: Diagnosis not present

## 2020-03-07 DIAGNOSIS — J069 Acute upper respiratory infection, unspecified: Secondary | ICD-10-CM | POA: Diagnosis present

## 2020-03-07 DIAGNOSIS — R059 Cough, unspecified: Secondary | ICD-10-CM | POA: Insufficient documentation

## 2020-03-07 MED ORDER — AMLODIPINE BESYLATE 5 MG PO TABS: 5.0000 mg | ORAL_TABLET | Freq: Every day | ORAL | 1 refills | Status: DC

## 2020-03-07 MED ORDER — HYDROCODONE-HOMATROPINE 5-1.5 MG/5ML PO SYRP: 5.0000 mL | ORAL_SOLUTION | Freq: Four times a day (QID) | ORAL | 0 refills | Status: DC | PRN

## 2020-03-07 NOTE — Discharge Instructions (Signed)
Return if any problems.

## 2020-03-07 NOTE — ED Provider Notes (Signed)
[facility]    CSN: [id number] Arrival date & time: 03/07/20  1400      History   Chief Complaint Chief Complaint  Patient presents with  . Cough    HPI Amy Church is a 35 y.o. female.   The history is provided by the patient. No language interpreter was used.  Cough Cough characteristics:  Productive Sputum characteristics:  Clear Severity:  Moderate Timing:  Constant Chronicity:  New Smoker: no   Relieved by:  Nothing Worsened by:  Nothing Ineffective treatments:  None tried Associated symptoms: no fever and no sore throat     Past Medical History:  Diagnosis Date  . Chronic fungal otitis externa   . Gestational diabetes   . Hypertension    Did not have during previous pregnancy  . Temporomandibular jaw dysfunction     Patient Active Problem List   Diagnosis Date Noted  . History of postpartum hemorrhage 03/28/2019  . History of pre-eclampsia 02/24/2019  . Unwanted fertility 02/09/2019  . History of gestational diabetes 12/17/2018  . BMI 40.0-44.9, adult (HCC) 10/23/2018  . Chronic hypertension 08/07/2018    Past Surgical History:  Procedure Laterality Date  . TUBAL LIGATION Bilateral 02/25/2019   Procedure: POST PARTUM TUBAL LIGATION;  Surgeon: Levie Heritage, DO;  Location: [facility];  Service: Gynecology;  Laterality: Bilateral;  . WISDOM TOOTH EXTRACTION      OB History    Gravida  4   Para  2   Term  1   Preterm  1   AB  2   Living  2     SAB      IAB  2   Ectopic      Multiple  0   Live Births  2            Home Medications    Prior to Admission medications   Medication Sig Start Date End Date Taking? Authorizing Provider  AMBULATORY NON FORMULARY MEDICATION Blood pressure cuff/Monitored regularly at home.  ICD 10: ZO09.90 08/07/18   Levie Heritage, DO  amLODipine (NORVASC) 5 MG tablet Take 1 tablet (5 mg total) by mouth daily. 03/07/20   Elson Areas, PA-C  cyclobenzaprine (FLEXERIL) 5 MG tablet  Take 1 tablet (5 mg total) by mouth 3 (three) times daily as needed for muscle spasms. DO NOT DRINK ALCOHOL OR DRIVE WHILE TAKING THIS MEDICATION 02/11/20   Particia Nearing, PA-C  HYDROcodone-homatropine [facility]) 5-1.5 MG/5ML syrup Take 5 mLs by mouth every 6 (six) hours as needed for cough. 03/07/20   Elson Areas, PA-C  ibuprofen (ADVIL) 800 MG tablet Take 1 tablet (800 mg total) by mouth 3 (three) times daily with meals. 02/11/20   Particia Nearing, PA-C  ondansetron (ZOFRAN-ODT) 4 MG disintegrating tablet Take 1 tablet (4 mg total) by mouth every 8 (eight) hours as needed for nausea or vomiting. 05/31/19   Mardella Layman, MD    Family History Family History  Problem Relation Age of Onset  . Heart disease Paternal Grandmother   . Hypertension Paternal Grandmother   . Diabetes Paternal Grandmother   . Cancer Mother        pelvic    Social History Social History   Tobacco Use  . Smoking status: Former Smoker    Packs/day: 0.25    Types: Cigarettes  . Smokeless tobacco: Never Used  Vaping Use  . Vaping Use: Never used  Substance Use Topics  . Alcohol use: Not  Currently  . Drug use: Not Currently    Types: Marijuana    Comment: stopped with +UPT     Allergies   Penicillins   Review of Systems Review of Systems  Constitutional: Negative for fever.  HENT: Negative for sore throat.   Respiratory: Positive for cough.   All other systems reviewed and are negative.    Physical Exam Triage Vital Signs ED Triage Vitals  Enc Vitals Group     BP 03/07/20 1622 (!) 154/127     Pulse Rate 03/07/20 1622 (!) 109     Resp 03/07/20 1622 18     Temp 03/07/20 1622 98.2 F (36.8 C)     Temp Source 03/07/20 1622 Oral     SpO2 03/07/20 1622 100 %     Weight --      Height --      Head Circumference --      Peak Flow --      Pain Score 03/07/20 1620 0     Pain Loc --      Pain Edu? --      Excl. in GC? --    No data found.  Updated Vital Signs BP (!) 154/127  (BP Location: Left Arm)   Pulse (!) 109   Temp 98.2 F (36.8 C) (Oral)   Resp 18   LMP  (Within Months) Comment: 1 month   SpO2 100%   Visual Acuity Right Eye Distance:   Left Eye Distance:   Bilateral Distance:    Right Eye Near:   Left Eye Near:    Bilateral Near:     Physical Exam Vitals and nursing note reviewed.  Constitutional:      Appearance: She is well-developed and well-nourished.  HENT:     Head: Normocephalic.  Eyes:     Extraocular Movements: EOM normal.  Cardiovascular:     Rate and Rhythm: Normal rate and regular rhythm.  Pulmonary:     Effort: Pulmonary effort is normal.  Abdominal:     General: There is no distension.  Musculoskeletal:        General: Normal range of motion.     Cervical back: Normal range of motion.  Skin:    General: Skin is warm.  Neurological:     General: No focal deficit present.     Mental Status: She is alert and oriented to person, place, and time.  Psychiatric:        Mood and Affect: Mood and affect and mood normal.      UC Treatments / Results  Labs (all labs ordered are listed, but only abnormal results are displayed) Labs Reviewed  SARS CORONAVIRUS 2 (TAT 6-24 HRS)    EKG   Radiology No results found.  Procedures Procedures (including critical care time)  Medications Ordered in UC Medications - No data to display  Initial Impression / Assessment and Plan / UC Course  I have reviewed the triage vital signs and the nursing notes.  Pertinent labs & imaging results that were available during my care of the patient were reviewed by me and considered in my medical decision making (see chart for details).     MDM:  Probable viral illness.  Pt given rx for cough medication.  Covid pending Final Clinical Impressions(s) / UC Diagnoses   Final diagnoses:  Cough  Viral URI with cough  Essential hypertension  An After Visit Summary was printed and given to the patient.    Discharge Instructions  Return if any problems.    ED Prescriptions    Medication Sig Dispense Auth. Provider   amLODipine (NORVASC) 5 MG tablet Take 1 tablet (5 mg total) by mouth daily. 30 tablet Senie Lanese K, PA-C   HYDROcodone-homatropine [facility]) 5-1.5 MG/5ML syrup Take 5 mLs by mouth every 6 (six) hours as needed for cough. 90 mL Fransico Meadow, [place]     PDMP not reviewed this encounter.   Fransico Meadow, [place] 03/07/20 1739

## 2020-03-07 NOTE — ED Triage Notes (Signed)
Pt presents with cough x 4 days 

## 2020-03-08 LAB — SARS CORONAVIRUS 2 (TAT 6-24 HRS): SARS Coronavirus 2: NEGATIVE

## 2020-03-19 ENCOUNTER — Other Ambulatory Visit: Payer: Self-pay | Admitting: Internal Medicine

## 2020-03-20 LAB — CBC
HCT: 39 % (ref 35.0–45.0)
Hemoglobin: 13 g/dL (ref 11.7–15.5)
MCH: 27.4 pg (ref 27.0–33.0)
MCHC: 33.3 g/dL (ref 32.0–36.0)
MCV: 82.3 fL (ref 80.0–100.0)
MPV: 9.3 fL (ref 7.5–12.5)
Platelets: 436 10*3/uL — ABNORMAL HIGH (ref 140–400)
RBC: 4.74 Million/uL (ref 3.80–5.10)
RDW: 14.9 % (ref 11.0–15.0)
WBC: 7.5 10*3/uL (ref 3.8–10.8)

## 2020-03-20 LAB — COMPLETE METABOLIC PANEL WITH GFR
AG Ratio: 1.5 (calc) (ref 1.0–2.5)
ALT: 25 U/L (ref 6–29)
AST: 19 U/L (ref 10–30)
Albumin: 4 g/dL (ref 3.6–5.1)
Alkaline phosphatase (APISO): 54 U/L (ref 31–125)
BUN: 11 mg/dL (ref 7–25)
CO2: 25 mmol/L (ref 20–32)
Calcium: 9.6 mg/dL (ref 8.6–10.2)
Chloride: 103 mmol/L (ref 98–110)
Creat: 0.73 mg/dL (ref 0.50–1.10)
GFR, Est African American: 124 mL/min/{1.73_m2} (ref >=60)
GFR, Est Non African American: 107 mL/min/{1.73_m2} (ref >=60)
Globulin: 2.6 g/dL (calc) (ref 1.9–3.7)
Glucose, Bld: 137 mg/dL — ABNORMAL HIGH (ref 65–99)
Potassium: 4.3 mmol/L (ref 3.5–5.3)
Sodium: 138 mmol/L (ref 135–146)
Total Bilirubin: 0.2 mg/dL (ref 0.2–1.2)
Total Protein: 6.6 g/dL (ref 6.1–8.1)

## 2020-03-20 LAB — LIPID PANEL
Cholesterol: 167 mg/dL
HDL: 42 mg/dL — ABNORMAL LOW
LDL Cholesterol (Calc): 91 mg/dL
Non-HDL Cholesterol (Calc): 125 mg/dL
Total CHOL/HDL Ratio: 4 (calc)
Triglycerides: 257 mg/dL — ABNORMAL HIGH

## 2020-03-20 LAB — VITAMIN D 25 HYDROXY (VIT D DEFICIENCY, FRACTURES): Vit D, 25-Hydroxy: 14 ng/mL — ABNORMAL LOW (ref 30–100)

## 2020-03-20 LAB — TSH: TSH: 3.88 mIU/L

## 2020-07-18 ENCOUNTER — Other Ambulatory Visit: Payer: Self-pay | Admitting: Family Medicine

## 2020-07-21 ENCOUNTER — Emergency Department (HOSPITAL_COMMUNITY): Payer: Medicaid Other

## 2020-07-21 ENCOUNTER — Encounter (HOSPITAL_COMMUNITY): Payer: Self-pay

## 2020-07-21 ENCOUNTER — Other Ambulatory Visit: Payer: Self-pay

## 2020-07-21 ENCOUNTER — Emergency Department (HOSPITAL_COMMUNITY)
Admission: EM | Admit: 2020-07-21 | Discharge: 2020-07-21 | Disposition: A | Discharge status: Home / Self Care | Payer: Medicaid Other | Attending: Emergency Medicine | Admitting: Emergency Medicine

## 2020-07-21 DIAGNOSIS — R202 Paresthesia of skin: Secondary | ICD-10-CM | POA: Diagnosis not present

## 2020-07-21 DIAGNOSIS — I10 Essential (primary) hypertension: Secondary | ICD-10-CM | POA: Insufficient documentation

## 2020-07-21 DIAGNOSIS — R61 Generalized hyperhidrosis: Secondary | ICD-10-CM | POA: Diagnosis not present

## 2020-07-21 DIAGNOSIS — R0789 Other chest pain: Secondary | ICD-10-CM | POA: Diagnosis present

## 2020-07-21 DIAGNOSIS — R1013 Epigastric pain: Secondary | ICD-10-CM | POA: Insufficient documentation

## 2020-07-21 DIAGNOSIS — Z79899 Other long term (current) drug therapy: Secondary | ICD-10-CM | POA: Insufficient documentation

## 2020-07-21 LAB — BASIC METABOLIC PANEL
Anion gap: 9 (ref 5–15)
BUN: 9 mg/dL (ref 6–20)
CO2: 26 mmol/L (ref 22–32)
Calcium: 9 mg/dL (ref 8.9–10.3)
Chloride: 103 mmol/L (ref 98–111)
Creatinine, Ser: 0.79 mg/dL (ref 0.44–1.00)
GFR, Estimated: >60 mL/min (ref >60)
Glucose, Bld: 105 mg/dL — ABNORMAL HIGH (ref 70–99)
Potassium: 3.3 mmol/L — ABNORMAL LOW (ref 3.5–5.1)
Sodium: 138 mmol/L (ref 135–145)

## 2020-07-21 LAB — CBC
HCT: 43 % (ref 36.0–46.0)
Hemoglobin: 13.8 g/dL (ref 12.0–15.0)
MCH: 27.4 pg (ref 26.0–34.0)
MCHC: 32.1 g/dL (ref 30.0–36.0)
MCV: 85.5 fL (ref 80.0–100.0)
Platelets: 459 10*3/uL — ABNORMAL HIGH (ref 150–400)
RBC: 5.03 MIL/uL (ref 3.87–5.11)
RDW: 15.2 % (ref 11.5–15.5)
WBC: 8.6 10*3/uL (ref 4.0–10.5)
nRBC: 0 % (ref 0.0–0.2)

## 2020-07-21 LAB — TROPONIN I (HIGH SENSITIVITY): Troponin I (High Sensitivity): 2 ng/L (ref <18)

## 2020-07-21 LAB — I-STAT BETA HCG BLOOD, ED (MC, WL, AP ONLY): I-stat hCG, quantitative: 6.5 m[IU]/mL — ABNORMAL HIGH (ref <5)

## 2020-07-21 MED ORDER — ALUM & MAG HYDROXIDE-SIMETH 200-200-20 MG/5ML PO SUSP
30.0000 mL | Freq: Once | ORAL | Status: AC
  Administered 2020-07-21: 30 mL via ORAL
  Filled 2020-07-21: qty 30

## 2020-07-21 NOTE — ED Provider Notes (Signed)
Emergency Medicine Provider Triage Evaluation Note  Amy Church 35 y.o. female was evaluated in triage.  Pt complains of chest pain that is been ongoing for 2 days.  She reports it has been constant.  She states is been sharp and sometimes goes down her left arm.  She has sometimes been diaphoretic.  Denies any nausea/vomiting.  She states it is worse when she bends or moves.  She has had some shortness of breath.  She states her abdomen has been hurting as well.  She denies any fevers, cough.  She has a history of high blood pressure.  She has borderline diabetes.  She does not smoke.   Review of Systems  Positive: CP, SOB, abd pain Negative: N/v, fevers  Physical Exam  BP 134/82   Pulse 70   Temp 98.2 F (36.8 C) (Oral)   Resp 18   Ht 5\' 4"  (1.626 m)   Wt 65.8 kg   SpO2 100%   BMI 24.89 kg/m  Gen:   Awake, no distress   HEENT:  Atraumatic  Resp:  Normal effort  Cardiac:  Normal rate  Abd:   Nondistended, nontender  MSK:   Moves extremities without difficulty  Neuro:  Speech clear   Other:     Medical Decision Making  Medically screening exam initiated at 1:26 PM  Appropriate orders placed.  Amy Church was informed that the remainder of the evaluation will be completed by another provider, this initial triage assessment does not replace that evaluation. They are counseled that they will need to remain in the ED until the completion of their workup, including full H&P and results of any tests.  Risks of leaving the emergency department prior to completion of treatment were discussed. Patient was advised to inform ED staff if they are leaving before their treatment is complete. The patient acknowledged these risks and time was allowed for questions.     The patient appears stable so that the remainder of the MSE may be completed by another provider.   Clinical Impression  CP   Portions of this note were generated with Dragon dictation software. Dictation errors may occur  despite best attempts at proofreading.      [name], PA-C 07/21/20 1327    07/23/20, DO 07/21/20 1523

## 2020-07-21 NOTE — ED Triage Notes (Signed)
Patient c/o constant chest pain that radiates into the left arm x 2 days. Pain radiates down the left arm. Patient c/o SOB with movement.

## 2020-07-21 NOTE — ED Provider Notes (Signed)
[facility]EMERGENCY [facility]PT Provider Note   CSN: [id number] Arrival date & time: 07/21/20  1318     History Chief Complaint  Patient presents with  . Chest Pain    Amy Church is a 35 y.o. female w PMHx HTN, DM, presenting to the ED for evaluation of chest pain. Patient states pain has been going on for 4 days. Pain is constant, intermittently worse with particular movements causing sharp pains, not exertional. Associated belching with heartburn. Reports she has some epigastric pain initially when symptoms began that felt like acid reflux. That discomfort resolved. Some intermittent diaphoresis, patient relates this to the temperature and her activity level. Once, pain radiated towards left arm with tingling in fingers today.   Prescribed trulicity once week Potentially grandmother with heart disease. No high cholesterol, no tobacco use. No blood clot. No risks for DVT/PE. No estrogens, recent travel or immobilization, personal history of cancer, or recent surgery.    The history is provided by the patient.       Past Medical History:  Diagnosis Date  . Chronic fungal otitis externa   . Gestational diabetes   . Hypertension    Did not have during previous pregnancy  . Temporomandibular jaw dysfunction     Patient Active Problem List   Diagnosis Date Noted  . History of postpartum hemorrhage 03/28/2019  . History of pre-eclampsia 02/24/2019  . Unwanted fertility 02/09/2019  . History of gestational diabetes 12/17/2018  . BMI 40.0-44.9, adult (HCC) 10/23/2018  . Chronic hypertension 08/07/2018    Past Surgical History:  Procedure Laterality Date  . TUBAL LIGATION Bilateral 02/25/2019   Procedure: POST PARTUM TUBAL LIGATION;  Surgeon: Levie Heritage, DO;  Location: [facility];  Service: Gynecology;  Laterality: Bilateral;  . WISDOM TOOTH EXTRACTION       OB History    Gravida  4   Para  2   Term  1   Preterm  1   AB  2   Living  2      SAB      IAB  2   Ectopic      Multiple  0   Live Births  2           Family History  Problem Relation Age of Onset  . Heart disease Paternal Grandmother   . Hypertension Paternal Grandmother   . Diabetes Paternal Grandmother   . Cancer Mother        pelvic    Social History   Tobacco Use  . Smoking status: Never Smoker  . Smokeless tobacco: Never Used  Vaping Use  . Vaping Use: Never used  Substance Use Topics  . Alcohol use: Not Currently  . Drug use: Not Currently    Types: Marijuana    Comment: stopped with +UPT    Home Medications Prior to Admission medications   Medication Sig Start Date End Date Taking? Authorizing Provider  AMBULATORY NON FORMULARY MEDICATION Blood pressure cuff/Monitored regularly at home.  ICD 10: ZO09.90 08/07/18   Levie Heritage, DO  amLODipine (NORVASC) 5 MG tablet Take 1 tablet (5 mg total) by mouth daily. 03/07/20   Elson Areas, PA-C  cyclobenzaprine (FLEXERIL) 5 MG tablet Take 1 tablet (5 mg total) by mouth 3 (three) times daily as needed for muscle spasms. DO NOT DRINK ALCOHOL OR DRIVE WHILE TAKING THIS MEDICATION 02/11/20   Particia Nearing, PA-C  HYDROcodone-homatropine [facility]) 5-1.5 MG/5ML syrup Take 5 mLs by mouth  every 6 (six) hours as needed for cough. 03/07/20   Elson Areas, PA-C  ibuprofen (ADVIL) 800 MG tablet Take 1 tablet (800 mg total) by mouth 3 (three) times daily with meals. 02/11/20   Particia Nearing, PA-C  ondansetron (ZOFRAN-ODT) 4 MG disintegrating tablet Take 1 tablet (4 mg total) by mouth every 8 (eight) hours as needed for nausea or vomiting. 05/31/19   Mardella Layman, MD    Allergies    Penicillins  Review of Systems   Review of Systems  All other systems reviewed and are negative.   Physical Exam Updated Vital Signs BP (!) 157/114   Pulse 86   Temp 98.1 F (36.7 C) (Oral)   Resp 16   Ht 5\' 6"  (1.676 m)   Wt 133.9 kg   LMP 07/20/2020   SpO2 100%   BMI 47.65 kg/m    Physical Exam Vitals and nursing note reviewed.  Constitutional:      General: She is not in acute distress.    Appearance: She is well-developed. She is obese. She is not ill-appearing.  HENT:     Head: Normocephalic and atraumatic.  Eyes:     Conjunctiva/sclera: Conjunctivae normal.  Cardiovascular:     Rate and Rhythm: Normal rate and regular rhythm.     Pulses: Normal pulses.  Pulmonary:     Effort: Pulmonary effort is normal. No respiratory distress.     Breath sounds: Normal breath sounds.  Chest:     Chest wall: Tenderness (left anterior chest wall TTP, reproduces pain) present.  Abdominal:     General: Bowel sounds are normal.     Palpations: Abdomen is soft.     Tenderness: There is no abdominal tenderness. There is no guarding.  Musculoskeletal:     Right lower leg: No edema.     Left lower leg: No edema.  Skin:    General: Skin is warm.  Neurological:     Mental Status: She is alert.  Psychiatric:        Behavior: Behavior normal.     ED Results / Procedures / Treatments   Labs (all labs ordered are listed, but only abnormal results are displayed) Labs Reviewed  BASIC METABOLIC PANEL - Abnormal; Notable for the following components:      Result Value   Potassium 3.3 (*)    Glucose, Bld 105 (*)    All other components within normal limits  CBC - Abnormal; Notable for the following components:   Platelets 459 (*)    All other components within normal limits  I-STAT BETA HCG BLOOD, ED ([facility], [facility], [facility] ONLY) - Abnormal; Notable for the following components:   I-stat hCG, quantitative 6.5 (*)    All other components within normal limits  TROPONIN I (HIGH SENSITIVITY)    EKG EKG Interpretation  Date/Time:  Monday Jul 21 2020 18:06:40 EDT Ventricular Rate:  74 PR Interval:  166 QRS Duration: 83 QT Interval:  366 QTC Calculation: 406 R Axis:   41 Text Interpretation: Sinus rhythm No significant change since last tracing Confirmed by [id number]  (Alvira Monday) on 07/21/2020 6:18:21 PM   Radiology DG Chest 2 View  Result Date: 07/21/2020 CLINICAL DATA:  Chest pain that radiates into left arm for 2 days. EXAM: CHEST - 2 VIEW COMPARISON:  None. FINDINGS: The heart size and mediastinal contours are within normal limits. Both lungs are clear. The visualized skeletal structures are unremarkable. IMPRESSION: No active cardiopulmonary disease. Electronically Signed   By: 07/23/2020  Bradly Chris M.D.   On: 07/21/2020 14:34    Procedures Procedures   Medications Ordered in ED Medications  alum & mag hydroxide-simeth (MAALOX/MYLANTA) 200-200-20 MG/5ML suspension 30 mL (30 mLs Oral Given 07/21/20 1827)    ED Course  I have reviewed the triage vital signs and the nursing notes.  Pertinent labs & imaging results that were available during my care of the patient were reviewed by me and considered in my medical decision making (see chart for details).    MDM Rules/Calculators/A&P                          Patient is presenting for evaluation of constant left-sided chest pain x4 days, intermittently worse with particular movements and palpation.  It is easily reproducible on exam with palpation.  Lungs are clear.  She is not short of breath, low risk Wells, very low suspicion for PE.  She has a hear score of 3.  Cardiac work-up is reassuring with negative troponin, nonischemic EKG.  Chest x-ray is clear.  Delta troponin not indicated as pain has been constant over the last 4 days and not worsening.  Presentation is atypical.  Suspect chest wall pain versus GERD.  She is appropriate for outpatient follow-up.  As she is having heartburn with indigestion and belching, recommend she begin taking an acid such as Pepcid for symptom relief.  She is provided with strict return precautions, she verbalized understanding with this and agrees with care plan for discharge.  Discussed results, findings, treatment and follow up. Patient advised of return precautions. Patient  verbalized understanding and agreed with plan.  Final Clinical Impression(s) / ED Diagnoses Final diagnoses:  Atypical chest pain    Rx / DC Orders ED Discharge Orders    None       Rasaan Brotherton, [place] N, PA-C 07/22/20 0105    Alvira Monday, MD 07/22/20 1332

## 2020-07-21 NOTE — Discharge Instructions (Addendum)
Follow up with your primary care provider this week. You can try taking Pepcid for your heartburn and indigestion. This is on the shelf at any pharmacy in the antacid section.  Return to the ER for new or worsening symptoms; including worsening chest pain, shortness of breath, pain that radiates to the arm or neck, pain or shortness of breath worsened with exertion.

## 2020-10-20 ENCOUNTER — Other Ambulatory Visit: Payer: Self-pay | Admitting: Internal Medicine

## 2020-10-21 LAB — URINE CULTURE
MICRO NUMBER:: 12273448
SPECIMEN QUALITY:: ADEQUATE

## 2021-01-07 IMAGING — US US MFM OB FOLLOW-UP
1 series · 13 of 28 positions shown · non-contrast
Comparison: none

[Series 1: us mfm ob follow-up · 13 of 45 slices shown]
[im 2/45]
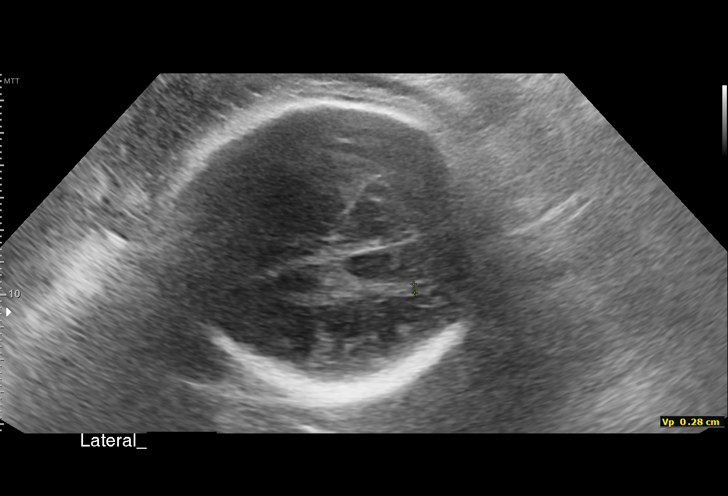
[im 5/45]
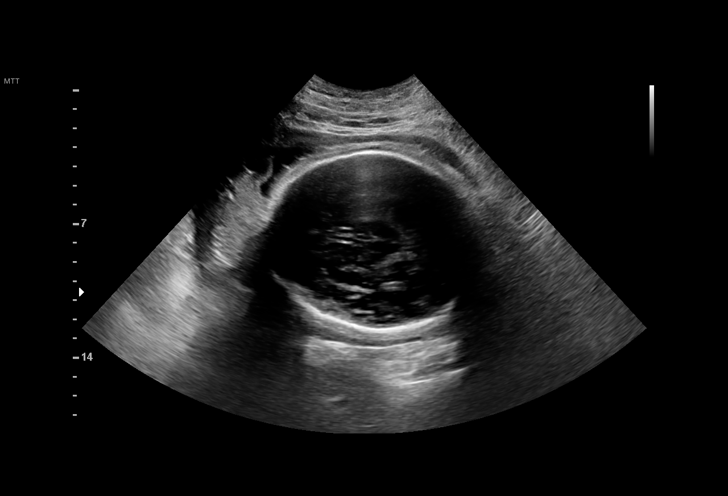
[im 9/45]
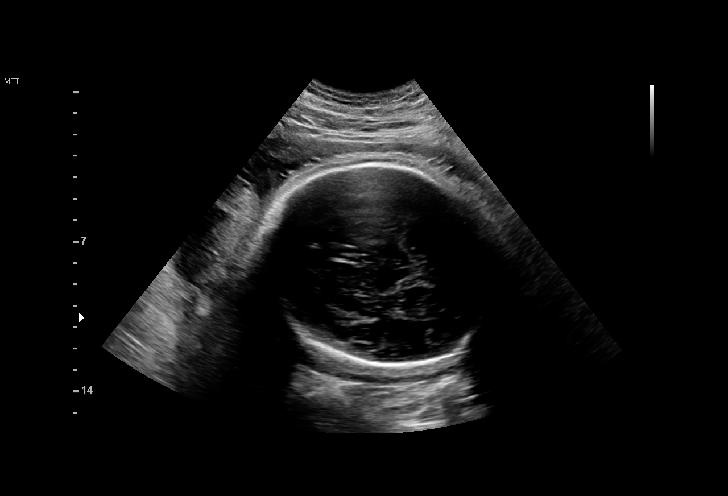
[im 12/45]
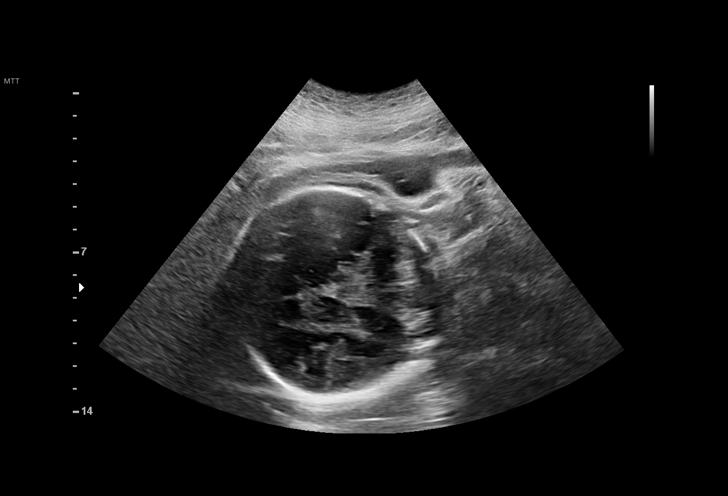
[im 15/45]
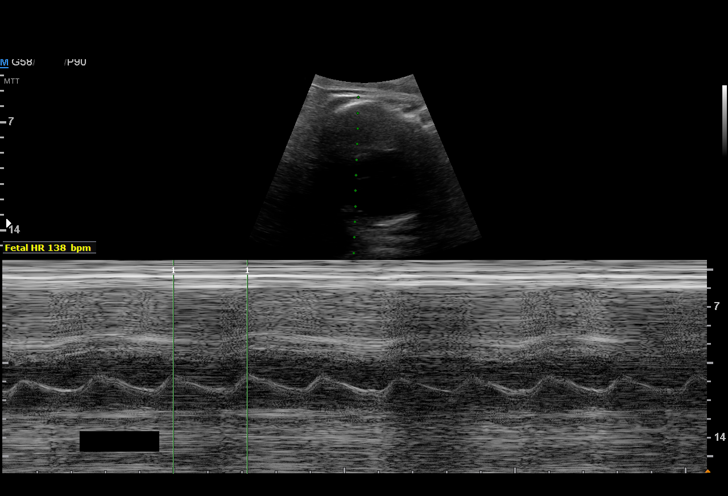
[im 18/45]
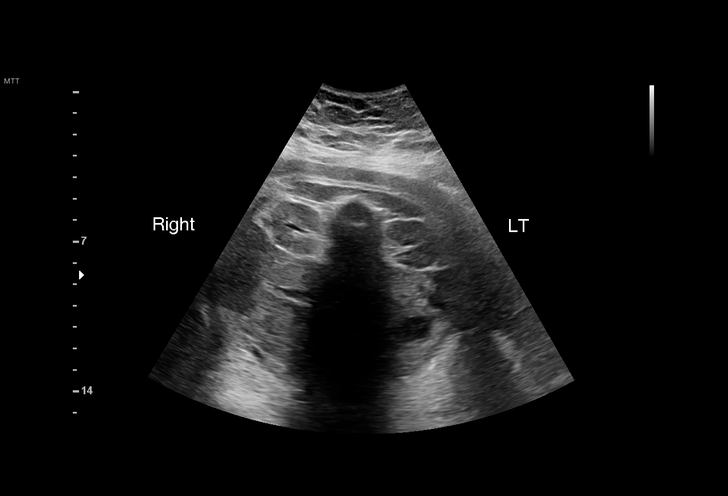
[im 23/45]
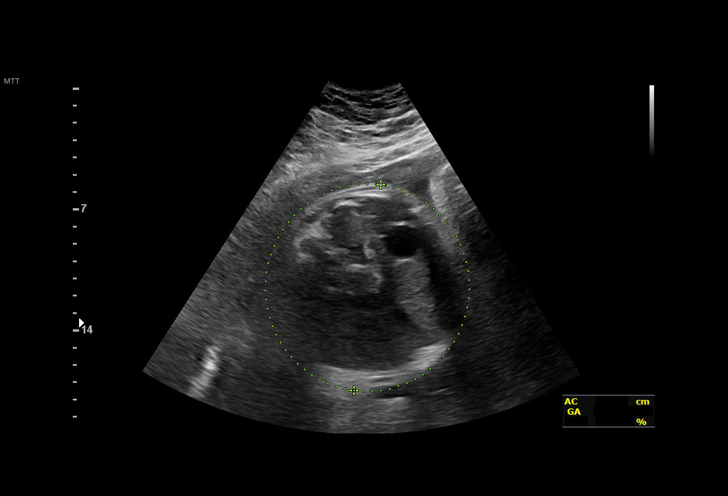
[im 27/45]
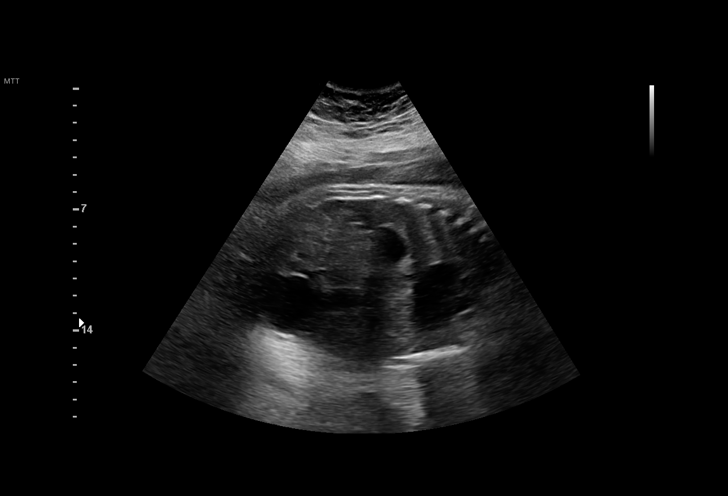
[im 30/45]
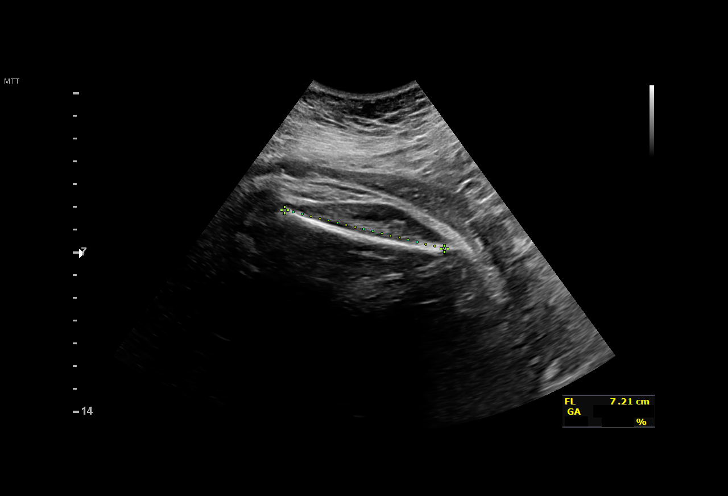
[im 33/45]
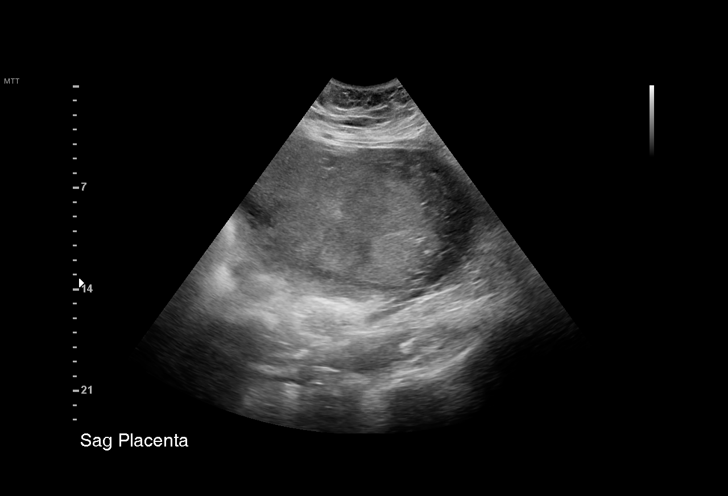
[im 36/45]
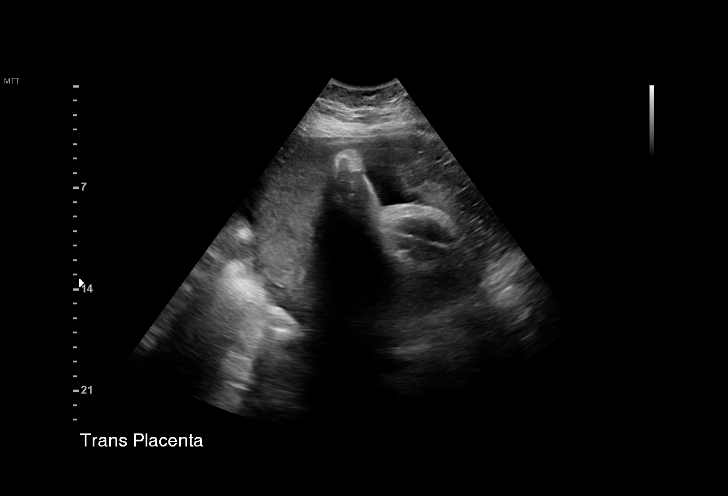
[im 40/45]
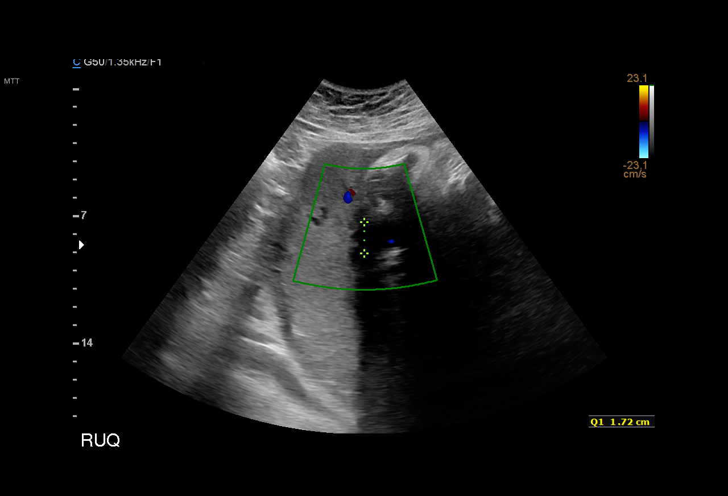
[im 43/45]
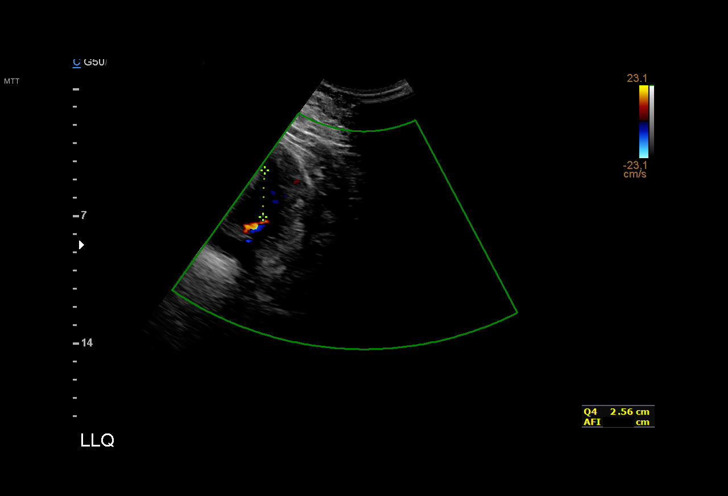

[13 of 28 positions shown; findings below may reference images not displayed]

[address]

 ----------------------------------------------------------------------

 ----------------------------------------------------------------------
Indications

  36 weeks gestation of pregnancy
  Gestational diabetes in pregnancy,
  controlled by oral hypoglycemic drugs
  Hypertension - Chronic/Pre-existing (ASA)
  Obesity complicating pregnancy, third
  trimester (BMI 43)
  Encounter for other antenatal screening
  follow-up
 ----------------------------------------------------------------------
Vital Signs

                                                Height:        5'6"
Fetal Evaluation

 Num Of Fetuses:         1
 Fetal Heart Rate(bpm):  138
 Cardiac Activity:       Observed
 Presentation:           Cephalic
 Placenta:               Posterior
 P. Cord Insertion:      Previously Visualized

 Amniotic Fluid
 AFI FV:      Within normal limits

 AFI Sum(cm)     %Tile       Largest Pocket(cm)
 10.71           28

 RUQ(cm)       RLQ(cm)       LUQ(cm)        LLQ(cm)

Biophysical Evaluation

 Amniotic F.V:   Pocket => 2 cm             F. Tone:        Observed
 F. Movement:    Observed                   Score:          [DATE]
 F. Breathing:   Observed
Biometry

 BPD:      93.1  mm     G. Age:  37w 6d         91  %    CI:        83.03   %    70 - 86
                                                         FL/HC:      22.1   %    20.1 -
 HC:      322.2  mm     G. Age:  36w 3d         21  %    HC/AC:      0.84        0.93 -
 AC:      381.9  mm     G. Age:  42w 1d       > 99  %    FL/BPD:     76.5   %    71 - 87
 FL:       71.2  mm     G. Age:  36w 3d         48  %    FL/AC:      18.6   %    20 - 24
 CER:      50.4  mm     G. Age:  N/A          > 95  %

 LV:        2.8  mm

 Est. FW:    2204  gm    8 lb 10 oz    > 99  %
OB History

 Gravidity:    4         Term:   1        Prem:   0        SAB:   0
 TOP:          2       Ectopic:  0        Living: 1
Gestational Age

 LMP:           43w 1d        Date:  04/25/18                 EDD:   01/30/19
 U/S Today:     38w 2d                                        EDD:   03/05/19
 Best:          36w 3d     Det. By:  Early Ultrasound         EDD:   03/18/19
                                     (07/31/18)
Anatomy

 Cranium:               Previously seen        LVOT:                   Previously seen
 Cavum:                 Previously seen        Aortic Arch:            Previously seen
 Ventricles:            Previously seen        Ductal Arch:            Previously seen
 Choroid Plexus:        Previously seen        Diaphragm:              Previously seen
 Cerebellum:            Previously seen        Stomach:                Previously Seen
 Posterior Fossa:       Previously seen        Abdomen:                Previously seen
 Nuchal Fold:           Previously seen        Abdominal Wall:         Previously seen
 Face:                  Orbits and profile     Cord Vessels:           Previously seen
                        previously seen
 Lips:                  Previously seen        Kidneys:                Previously seen
 Palate:                Previously seen        Bladder:                Previously seen
 Thoracic:              Previously seen        Spine:                  Previously seen
 Heart:                 Previously seen        Upper Extremities:      Previously seen
 RVOT:                  Not well visualized    Lower Extremities:      Previously seen

 Other:  Feet/left heel and hands/5th digits previously visualized. Technically
         difficult due to maternal habitus and fetal position.
Cervix Uterus Adnexa

 Cervix
 Not visualized (advanced GA >15wks)

 Uterus
 No abnormality visualized.
 Left Ovary
 No adnexal mass visualized.

 Right Ovary
 No adnexal mass visualized.

 Cul De Sac
 No free fluid seen.

 Adnexa
 No abnormality visualized.
Impression

 Patient has gestational diabetes.  She reports she has not
 been checking her blood glucose for the last 1 week (ran out
 of strips).  Previously tested values were higher.  She is
 meeting with the diabetic educator this afternoon.
 On ultrasound, the estimated fetal weight is at greater than
 the 99th percentile.  Amniotic fluid is normal good fetal
 activity seen.  Antenatal testing is reassuring.  BPP [DATE].

 I discussed the importance of good blood glucose control to
 prevent adverse fetal or neonatal outcomes.  I informed her
 that if she is unable to check her blood glucose levels she
 may consider the option of getting admitted for inpatient
 management.  Alternatively, delivery may be considered at
 37 or 38 weeks gestation.

 Patient has an appointment with her provider on 02/26/2019.
Recommendations

 -BPP next week if undelivered.
                 Franclet, [place]y

## 2021-05-27 ENCOUNTER — Other Ambulatory Visit: Payer: Self-pay

## 2021-05-27 ENCOUNTER — Emergency Department (HOSPITAL_BASED_OUTPATIENT_CLINIC_OR_DEPARTMENT_OTHER)
Admission: EM | Admit: 2021-05-27 | Discharge: 2021-05-27 | Disposition: A | Discharge status: Home / Self Care | Payer: Medicaid Other | Attending: Emergency Medicine | Admitting: Emergency Medicine

## 2021-05-27 ENCOUNTER — Encounter (HOSPITAL_BASED_OUTPATIENT_CLINIC_OR_DEPARTMENT_OTHER): Payer: Self-pay

## 2021-05-27 ENCOUNTER — Emergency Department (HOSPITAL_BASED_OUTPATIENT_CLINIC_OR_DEPARTMENT_OTHER): Payer: Medicaid Other

## 2021-05-27 DIAGNOSIS — R1031 Right lower quadrant pain: Secondary | ICD-10-CM | POA: Insufficient documentation

## 2021-05-27 DIAGNOSIS — R3915 Urgency of urination: Secondary | ICD-10-CM | POA: Insufficient documentation

## 2021-05-27 DIAGNOSIS — R6883 Chills (without fever): Secondary | ICD-10-CM | POA: Diagnosis not present

## 2021-05-27 DIAGNOSIS — N12 Tubulo-interstitial nephritis, not specified as acute or chronic: Secondary | ICD-10-CM

## 2021-05-27 DIAGNOSIS — R109 Unspecified abdominal pain: Secondary | ICD-10-CM

## 2021-05-27 LAB — CBC WITH DIFFERENTIAL/PLATELET
Abs Immature Granulocytes: 0.03 10*3/uL (ref 0.00–0.07)
Basophils Absolute: 0.1 10*3/uL (ref 0.0–0.1)
Basophils Relative: 1 %
Eosinophils Absolute: 0.1 10*3/uL (ref 0.0–0.5)
Eosinophils Relative: 1 %
HCT: 45.4 % (ref 36.0–46.0)
Hemoglobin: 14.9 g/dL (ref 12.0–15.0)
Immature Granulocytes: 0 %
Lymphocytes Relative: 29 %
Lymphs Abs: 2.4 10*3/uL (ref 0.7–4.0)
MCH: 28 pg (ref 26.0–34.0)
MCHC: 32.8 g/dL (ref 30.0–36.0)
MCV: 85.2 fL (ref 80.0–100.0)
Monocytes Absolute: 1.1 10*3/uL — ABNORMAL HIGH (ref 0.1–1.0)
Monocytes Relative: 13 %
Neutro Abs: 4.7 10*3/uL (ref 1.7–7.7)
Neutrophils Relative %: 56 %
Platelets: 428 10*3/uL — ABNORMAL HIGH (ref 150–400)
RBC: 5.33 MIL/uL — ABNORMAL HIGH (ref 3.87–5.11)
RDW: 14.6 % (ref 11.5–15.5)
WBC: 8.4 10*3/uL (ref 4.0–10.5)
nRBC: 0 % (ref 0.0–0.2)

## 2021-05-27 LAB — WET PREP, GENITAL
Clue Cells Wet Prep HPF POC: NONE SEEN
Sperm: NONE SEEN
Trich, Wet Prep: NONE SEEN
WBC, Wet Prep HPF POC: <10 (ref <10)
Yeast Wet Prep HPF POC: NONE SEEN

## 2021-05-27 LAB — COMPREHENSIVE METABOLIC PANEL
ALT: 21 U/L (ref 0–44)
AST: 24 U/L (ref 15–41)
Albumin: 4.7 g/dL (ref 3.5–5.0)
Alkaline Phosphatase: 54 U/L (ref 38–126)
Anion gap: 10 (ref 5–15)
BUN: 7 mg/dL (ref 6–20)
CO2: 29 mmol/L (ref 22–32)
Calcium: 10 mg/dL (ref 8.9–10.3)
Chloride: 99 mmol/L (ref 98–111)
Creatinine, Ser: 0.83 mg/dL (ref 0.44–1.00)
GFR, Estimated: >60 mL/min (ref >60)
Glucose, Bld: 80 mg/dL (ref 70–99)
Potassium: 3.7 mmol/L (ref 3.5–5.1)
Sodium: 138 mmol/L (ref 135–145)
Total Bilirubin: 0.4 mg/dL (ref 0.3–1.2)
Total Protein: 8.8 g/dL — ABNORMAL HIGH (ref 6.5–8.1)

## 2021-05-27 LAB — URINALYSIS, ROUTINE W REFLEX MICROSCOPIC
Bilirubin Urine: NEGATIVE
Glucose, UA: NEGATIVE mg/dL
Hgb urine dipstick: NEGATIVE
Ketones, ur: 15 mg/dL — AB
Nitrite: NEGATIVE
Protein, ur: 100 mg/dL — AB
Specific Gravity, Urine: 1.037 — ABNORMAL HIGH (ref 1.005–1.030)
pH: 6 (ref 5.0–8.0)

## 2021-05-27 LAB — PREGNANCY, URINE: Preg Test, Ur: NEGATIVE

## 2021-05-27 MED ORDER — CEPHALEXIN 500 MG PO CAPS: 500.0000 mg | ORAL_CAPSULE | Freq: Three times a day (TID) | ORAL | 0 refills | Status: AC

## 2021-05-27 MED ORDER — LACTATED RINGERS IV BOLUS
1000.0000 mL | Freq: Once | INTRAVENOUS | Status: AC
  Administered 2021-05-27: 1000 mL via INTRAVENOUS

## 2021-05-27 MED ORDER — FENTANYL CITRATE PF 50 MCG/ML IJ SOSY
50.0000 ug | PREFILLED_SYRINGE | Freq: Once | INTRAMUSCULAR | Status: AC
  Administered 2021-05-27: 50 ug via INTRAVENOUS
  Filled 2021-05-27: qty 1

## 2021-05-27 MED ORDER — IOHEXOL 300 MG/ML  SOLN
100.0000 mL | Freq: Once | INTRAMUSCULAR | Status: AC | PRN
  Administered 2021-05-27: 100 mL via INTRAVENOUS

## 2021-05-27 MED ORDER — ONDANSETRON HCL 4 MG/2ML IJ SOLN
4.0000 mg | Freq: Once | INTRAMUSCULAR | Status: AC
  Administered 2021-05-27: 4 mg via INTRAVENOUS
  Filled 2021-05-27: qty 2

## 2021-05-27 MED ORDER — NAPROXEN 500 MG PO TABS: 500.0000 mg | ORAL_TABLET | Freq: Two times a day (BID) | ORAL | 0 refills | Status: AC

## 2021-05-27 MED ORDER — DOXYCYCLINE HYCLATE 100 MG PO CAPS: 100.0000 mg | ORAL_CAPSULE | Freq: Two times a day (BID) | ORAL | 0 refills | Status: AC

## 2021-05-27 MED ORDER — CEFTRIAXONE SODIUM 500 MG IJ SOLR
500.0000 mg | Freq: Once | INTRAMUSCULAR | Status: AC
  Administered 2021-05-27: 500 mg via INTRAMUSCULAR
  Filled 2021-05-27: qty 500

## 2021-05-27 MED ORDER — PHENAZOPYRIDINE HCL 200 MG PO TABS: 200.0000 mg | ORAL_TABLET | Freq: Three times a day (TID) | ORAL | 0 refills | Status: DC | PRN

## 2021-05-27 NOTE — ED Triage Notes (Signed)
She c/o right flank pain, frequency and "sweating sometimes" x 5 days. ?

## 2021-05-27 NOTE — Discharge Instructions (Addendum)
It was a pleasure caring for you.  You have been given a prescription for Keflex for your urinary tract infection.  I have also given you a prescription for doxycycline in case your other testing comes back positive AND you do not need to take this/pick this up unless testing is positive. ?

## 2021-05-27 NOTE — ED Provider Notes (Addendum)
?[place] EMERGENCY DEPT ?Provider Note ? ? ?CSN: [id number] ?Arrival date & time: 05/27/21  1243 ? ?  ? ?History ? ?Chief Complaint  ?Patient presents with  ? Flank Pain  ? ? ?Amy Church is a 36 y.o. female. ? ? ?Flank Pain ?Associated symptoms include abdominal pain.  ? ? 36 year old female presenting with five days of right sided flank pain, urinary urgency RLQ abdominal pain. She denies vaginal bleeding or discharge, endorses worsening pain in her flank. Endorses chills, denies fevers.  ? ?Medical history significant for bilateral tubal ligation in 2020.  ? ? ? ?Home Medications ?Prior to Admission medications   ?Medication Sig Start Date End Date Taking? Authorizing Provider  ?AMBULATORY NON FORMULARY MEDICATION Blood pressure cuff/Monitored regularly at home.  ICD 10: ZO09.90 08/07/18   Truett Mainland, DO  ?amLODipine (NORVASC) 5 MG tablet Take 1 tablet (5 mg total) by mouth daily. 03/07/20   Fransico Meadow, PA-C  ?cyclobenzaprine (FLEXERIL) 5 MG tablet Take 1 tablet (5 mg total) by mouth 3 (three) times daily as needed for muscle spasms. DO NOT DRINK ALCOHOL OR DRIVE WHILE TAKING THIS MEDICATION 02/11/20   Volney American, PA-C  ?HYDROcodone-homatropine [facility]) 5-1.5 MG/5ML syrup Take 5 mLs by mouth every 6 (six) hours as needed for cough. 03/07/20   Fransico Meadow, PA-C  ?ibuprofen (ADVIL) 800 MG tablet Take 1 tablet (800 mg total) by mouth 3 (three) times daily with meals. 02/11/20   Volney American, PA-C  ?ondansetron (ZOFRAN-ODT) 4 MG disintegrating tablet Take 1 tablet (4 mg total) by mouth every 8 (eight) hours as needed for nausea or vomiting. 05/31/19   Vanessa Kick, MD  ?   ? ?Allergies    ?Penicillins   ? ?Review of Systems   ?Review of Systems  ?Gastrointestinal:  Positive for abdominal pain and nausea.  ?Genitourinary:  Positive for flank pain and urgency.  ?All other systems reviewed and are negative. ? ?Physical Exam ?Updated Vital Signs ?BP 125/90 (BP Location:  Left Arm)   Pulse 88   Temp 98.7 ?F (37.1 ?C) (Oral)   Resp 16   LMP  (LMP Unknown)   SpO2 94%  ?Physical Exam ?Vitals and nursing note reviewed.  ?Constitutional:   ?   General: She is not in acute distress. ?   Appearance: She is well-developed.  ?HENT:  ?   Head: Normocephalic and atraumatic.  ?Eyes:  ?   Conjunctiva/sclera: Conjunctivae normal.  ?Cardiovascular:  ?   Rate and Rhythm: Normal rate and regular rhythm.  ?   Heart sounds: No murmur heard. ?Pulmonary:  ?   Effort: Pulmonary effort is normal. No respiratory distress.  ?   Breath sounds: Normal breath sounds.  ?Abdominal:  ?   Palpations: Abdomen is soft.  ?   Tenderness: There is abdominal tenderness in the right lower quadrant. There is right CVA tenderness. There is no left CVA tenderness, guarding or rebound.  ?Musculoskeletal:     ?   General: No swelling.  ?   Cervical back: Neck supple.  ?Skin: ?   General: Skin is warm and dry.  ?   Capillary Refill: Capillary refill takes less than 2 seconds.  ?Neurological:  ?   Mental Status: She is alert.  ?Psychiatric:     ?   Mood and Affect: Mood normal.  ? ? ?ED Results / Procedures / Treatments   ?Labs ?(all labs ordered are listed, but only abnormal results are displayed) ?Labs Reviewed -  No data to display ? ?EKG ?None ? ?Radiology ?No results found. ? ?Procedures ?Procedures  ? ? ?Medications Ordered in ED ?Medications - No data to display ? ?ED Course/ Medical Decision Making/ A&P ?  ?                        ?Medical Decision Making ?Amount and/or Complexity of Data Reviewed ?Labs: ordered. ?Radiology: ordered. ? ?Risk ?Prescription drug management. ? ? ?36 year old female presenting with five days of right sided flank pain, urinary urgency RLQ abdominal pain. She denies vaginal bleeding or discharge, endorses worsening pain in her flank. Endorses chills, denies fevers.  ? ?Medical history significant for bilateral tubal ligation in 2020.  ? ?On arrival, the patient was vitally stable,  afebrile, not tachycardic or tachypneic, saturating well on room air.  Physical exam significant for right lower quadrant tenderness to palpation and CVA tenderness. ? ?Differential Diagnosis: Considered the following etiologies, most likely pyelonephritis. Also considered appendicitis, Bowel Obstruction,  Nephrolithiasis, Pancreatitis, Cholecystitis, Shingles, Perforated Bowel or Ulcer, Diverticulosis/itis, Ischemic Mesentery, Inflammatory Bowel Disease. ? ?Screening laboratory work-up initiated and CT abdomen pelvis ordered.  The patient was administered an IV fluid bolus, IV Zofran and IV opiates for pain control.  Plan at time of signout to follow-up patient laboratory work-up and CT imaging.  Signout given to Dr. Billy Fischer at [phone]. ? ? ?Final Clinical Impression(s) / ED Diagnoses ?Final diagnoses:  ?None  ? ? ?Rx / DC Orders ?ED Discharge Orders   ? ? None  ? ?  ? ? ?  ?Regan Lemming, MD ?05/27/21 1833 ? ?  ?Regan Lemming, MD ?05/27/21 1837 ? ?

## 2021-05-28 ENCOUNTER — Encounter (HOSPITAL_BASED_OUTPATIENT_CLINIC_OR_DEPARTMENT_OTHER): Payer: Self-pay | Admitting: Emergency Medicine

## 2021-05-28 ENCOUNTER — Ambulatory Visit: Payer: Self-pay | Admitting: *Deleted

## 2021-05-28 ENCOUNTER — Emergency Department (HOSPITAL_BASED_OUTPATIENT_CLINIC_OR_DEPARTMENT_OTHER)
Admission: EM | Admit: 2021-05-28 | Discharge: 2021-05-28 | Disposition: A | Discharge status: Home / Self Care | Payer: Medicaid Other | Attending: Emergency Medicine | Admitting: Emergency Medicine

## 2021-05-28 ENCOUNTER — Other Ambulatory Visit: Payer: Self-pay

## 2021-05-28 DIAGNOSIS — N10 Acute tubulo-interstitial nephritis: Secondary | ICD-10-CM | POA: Diagnosis not present

## 2021-05-28 DIAGNOSIS — T7840XA Allergy, unspecified, initial encounter: Secondary | ICD-10-CM | POA: Diagnosis not present

## 2021-05-28 DIAGNOSIS — Z79899 Other long term (current) drug therapy: Secondary | ICD-10-CM | POA: Insufficient documentation

## 2021-05-28 LAB — URINALYSIS, ROUTINE W REFLEX MICROSCOPIC
Glucose, UA: NEGATIVE mg/dL
Hgb urine dipstick: NEGATIVE
Ketones, ur: 15 mg/dL — AB
Nitrite: POSITIVE — AB
Protein, ur: 30 mg/dL — AB
Specific Gravity, Urine: 1.025 (ref 1.005–1.030)
pH: 6 (ref 5.0–8.0)

## 2021-05-28 LAB — URINALYSIS, MICROSCOPIC (REFLEX)

## 2021-05-28 LAB — GC/CHLAMYDIA PROBE AMP (~~LOC~~) NOT AT ARMC
Chlamydia: NEGATIVE
Comment: NEGATIVE
Comment: NORMAL
Neisseria Gonorrhea: NEGATIVE

## 2021-05-28 MED ORDER — CIPROFLOXACIN HCL 500 MG PO TABS: 500.0000 mg | ORAL_TABLET | Freq: Two times a day (BID) | ORAL | 0 refills | Status: DC

## 2021-05-28 MED ORDER — DIPHENHYDRAMINE HCL 25 MG PO TABS: 25.0000 mg | ORAL_TABLET | Freq: Four times a day (QID) | ORAL | 0 refills | Status: DC | PRN

## 2021-05-28 MED ORDER — DIPHENHYDRAMINE HCL 25 MG PO CAPS
25.0000 mg | ORAL_CAPSULE | Freq: Once | ORAL | Status: AC
Start: 2021-05-28 — End: 2021-05-28
  Administered 2021-05-28: 25 mg via ORAL

## 2021-05-28 MED ORDER — FAMOTIDINE 20 MG PO TABS: 20.0000 mg | ORAL_TABLET | Freq: Two times a day (BID) | ORAL | 0 refills | Status: DC

## 2021-05-28 MED ORDER — PREDNISONE 10 MG PO TABS: 40.0000 mg | ORAL_TABLET | Freq: Every day | ORAL | 0 refills | Status: DC

## 2021-05-28 MED ORDER — PREDNISONE 50 MG PO TABS
60.0000 mg | ORAL_TABLET | Freq: Once | ORAL | Status: AC
  Administered 2021-05-28: 60 mg via ORAL

## 2021-05-28 MED ORDER — FAMOTIDINE 20 MG PO TABS
20.0000 mg | ORAL_TABLET | Freq: Once | ORAL | Status: AC
  Administered 2021-05-28: 20 mg via ORAL

## 2021-05-28 NOTE — ED Notes (Signed)
Pt  states that inflammation in her lips have decreased and that she feels better now than when she at 0500. Pt laughing and joking.  ?

## 2021-05-28 NOTE — Telephone Encounter (Signed)
?  Chief Complaint: Lip swelling, SOB ?Symptoms: Bottom lip swollen, SOB at rest ?Frequency: After taking meds this AM prescribed in ED for UTI including  2 antibiotics  ?Pertinent Negatives: Patient denies  ?Disposition: [x] ED /[] Urgent Care (no appt availability in office) / [] Appointment(In office/virtual)/ []  [facility]/ [] Home Care/ [] Refused Recommended Disposition /[] [facility]/ []  Follow-up with PCP ?Additional Notes:  Pt states will follow disposition. ?Reason for Disposition ? [1] Caller has URGENT medicine question about med that PCP or specialist prescribed AND [2] triager unable to answer question ? ?Answer Assessment - Initial Assessment Questions ?1. NAME of MEDICATION: "What medicine are you calling about?" ?    Several given in ED yesterday ?2. QUESTION: "What is your question?" (e.g., double dose of medicine, side effect) ?    Which one has penicillin in it? ?3. PRESCRIBING HCP: "Who prescribed it?" Reason: if prescribed by specialist, call should be referred to that group. ?    ED ?4. SYMPTOMS: "Do you have any symptoms?" ?    SOB at rest, lip swelling ?5. SEVERITY: If symptoms are present, ask "Are they mild, moderate or severe?" ? ?Protocols used: Medication Question Call-A-AH ? ?

## 2021-05-28 NOTE — ED Triage Notes (Addendum)
Pt stated that her lips started swelling 0500 this morning and she feels that her throat is closed a little accompanied with SOB. Pt stated that she is allergic to penicillins. Pt was prescribed Doxycycline 100 mg, cephalexin 500 mg and phenazopyridine 200 mg. Pt took one of each last night between 2300 and 0000. Pt ambulatory and alert, 99% room air.  ?

## 2021-05-28 NOTE — ED Notes (Signed)
Discharge instructions, follow up care, and prescriptions reviewed and explained, pt verbalized understanding.  

## 2021-05-28 NOTE — ED Provider Notes (Signed)
?[facility] EMERGENCY DEPT ?Provider Note ? ? ?CSN: [id number] ?Arrival date & time: 05/28/21  [id number] ? ?  ? ?History ? ?Chief Complaint  ?Patient presents with  ? Allergic Reaction  ? ? ?Amy Church is a 36 y.o. female. ? ?Patient seen here yesterday.  For right-sided flank pain.  Patient had an extensive work-up to include CT scan of the abdomen and pelvis no signs of any kidney stones.  That was essentially normal.  Nothing on that scan consistent with pyelonephritis.  But clinically I think they were concerned about pyelonephritis and patient received Rocephin IM discharged on doxycycline and also discharged on Keflex.  The Keflex probably was for the concern for urinary tract infection.  Patient's urinalysis was abnormal.  But urine culture was not sent.  And the doxycycline may have been just in case of a pelvic infection or STD.  But patient's wet prep well without any significant abnormalities.  Patient states they did do a pelvic exam.  Patient this morning awoke with her tongue feeling thick and swelling to her lower lip area.  No rash.  Also felt some tightness in her throat.  No wheezing.  States those symptoms have improved but her tongue still feels a little thick still feels a little tight in the throat and a little bit of swelling to the lower lip.  Patient has a known allergy to penicillin.  Patient still has the right-sided flank pain.  But she states it is improving. ? ? ?  ? ?Home Medications ?Prior to Admission medications   ?Medication Sig Start Date End Date Taking? Authorizing Provider  ?ciprofloxacin (CIPRO) 500 MG tablet Take 1 tablet (500 mg total) by mouth 2 (two) times daily. 05/28/21  Yes Vanetta MuldersZackowski, Kanya Potteiger, MD  ?diphenhydrAMINE (BENADRYL) 25 MG tablet Take 1 tablet (25 mg total) by mouth every 6 (six) hours as needed for allergies. 05/28/21  Yes Vanetta MuldersZackowski, Dakwan Pridgen, MD  ?famotidine (PEPCID) 20 MG tablet Take 1 tablet (20 mg total) by mouth 2 (two) times daily. 05/28/21  Yes  Vanetta MuldersZackowski, Vung Kush, MD  ?predniSONE (DELTASONE) 10 MG tablet Take 4 tablets (40 mg total) by mouth daily. 05/28/21  Yes Vanetta MuldersZackowski, Blakleigh Straw, MD  ?AMBULATORY NON FORMULARY MEDICATION Blood pressure cuff/Monitored regularly at home.  ICD 10: ZO09.90 08/07/18   Levie HeritageStinson, Jacob J, DO  ?amLODipine (NORVASC) 5 MG tablet Take 1 tablet (5 mg total) by mouth daily. 03/07/20   Elson AreasSofia, Leslie K, PA-C  ?cephALEXin (KEFLEX) 500 MG capsule Take 1 capsule (500 mg total) by mouth 3 (three) times daily for 10 days. 05/27/21 06/06/21  Alvira MondaySchlossman, Erin, MD  ?cyclobenzaprine (FLEXERIL) 5 MG tablet Take 1 tablet (5 mg total) by mouth 3 (three) times daily as needed for muscle spasms. DO NOT DRINK ALCOHOL OR DRIVE WHILE TAKING THIS MEDICATION 02/11/20   Particia NearingLane, Rachel Elizabeth, PA-C  ?doxycycline (VIBRAMYCIN) 100 MG capsule Take 1 capsule (100 mg total) by mouth 2 (two) times daily for 7 days. 05/27/21 06/03/21  Alvira MondaySchlossman, Erin, MD  ?HYDROcodone-homatropine [facility](HYCODAN) 5-1.5 MG/5ML syrup Take 5 mLs by mouth every 6 (six) hours as needed for cough. 03/07/20   Elson AreasSofia, Leslie K, PA-C  ?naproxen (NAPROSYN) 500 MG tablet Take 1 tablet (500 mg total) by mouth 2 (two) times daily with a meal for 4 days. 05/27/21 05/31/21  Alvira MondaySchlossman, Erin, MD  ?ondansetron (ZOFRAN-ODT) 4 MG disintegrating tablet Take 1 tablet (4 mg total) by mouth every 8 (eight) hours as needed for nausea or vomiting. 05/31/19   Mardella LaymanHagler, Brian, MD  ?  phenazopyridine (PYRIDIUM) 200 MG tablet Take 1 tablet (200 mg total) by mouth 3 (three) times daily as needed for pain. 05/27/21   Alvira Monday, MD  ?   ? ?Allergies    ?Penicillins   ? ?Review of Systems   ?Review of Systems  ?Constitutional:  Negative for chills and fever.  ?HENT:  Positive for facial swelling. Negative for rhinorrhea, sore throat, trouble swallowing and voice change.   ?Eyes:  Negative for visual disturbance.  ?Respiratory:  Negative for cough and shortness of breath.   ?Cardiovascular:  Negative for chest pain and leg swelling.   ?Gastrointestinal:  Negative for abdominal pain, diarrhea, nausea and vomiting.  ?Genitourinary:  Negative for dysuria.  ?Musculoskeletal:  Negative for back pain and neck pain.  ?Skin:  Negative for rash.  ?Neurological:  Negative for dizziness, light-headedness and headaches.  ?Hematological:  Does not bruise/bleed easily.  ?Psychiatric/Behavioral:  Negative for confusion.   ? ?Physical Exam ?Updated Vital Signs ?BP (!) 134/94   Pulse 83   Temp 97.7 ?F (36.5 ?C) (Oral)   Resp 17   Ht 1.676 m (5\' 6" )   Wt 127 kg   LMP  (LMP Unknown)   SpO2 98%   BMI 45.19 kg/m?  ?Physical Exam ?Vitals and nursing note reviewed.  ?Constitutional:   ?   General: She is not in acute distress. ?   Appearance: Normal appearance. She is well-developed.  ?HENT:  ?   Head: Normocephalic and atraumatic.  ?   Mouth/Throat:  ?   Mouth: Mucous membranes are moist.  ?   Pharynx: Oropharynx is clear. No oropharyngeal exudate or posterior oropharyngeal erythema.  ?   Comments: No obvious tongue swelling.  Uvula midline.  No obvious lip swelling.  But patient feels as if her tongue is thick and her lower lip is still little swollen. ?Eyes:  ?   Extraocular Movements: Extraocular movements intact.  ?   Conjunctiva/sclera: Conjunctivae normal.  ?   Pupils: Pupils are equal, round, and reactive to light.  ?Cardiovascular:  ?   Rate and Rhythm: Normal rate and regular rhythm.  ?   Heart sounds: No murmur heard. ?Pulmonary:  ?   Effort: Pulmonary effort is normal. No respiratory distress.  ?   Breath sounds: Normal breath sounds. No stridor. No wheezing, rhonchi or rales.  ?Abdominal:  ?   Palpations: Abdomen is soft.  ?   Tenderness: There is no abdominal tenderness.  ?Musculoskeletal:     ?   General: No swelling.  ?   Cervical back: Normal range of motion and neck supple.  ?Skin: ?   General: Skin is warm and dry.  ?   Capillary Refill: Capillary refill takes less than 2 seconds.  ?Neurological:  ?   General: No focal deficit present.  ?    Mental Status: She is alert and oriented to person, place, and time.  ?Psychiatric:     ?   Mood and Affect: Mood normal.  ? ? ?ED Results / Procedures / Treatments   ?Labs ?(all labs ordered are listed, but only abnormal results are displayed) ?Labs Reviewed  ?URINALYSIS, ROUTINE W REFLEX MICROSCOPIC - Abnormal; Notable for the following components:  ?    Result Value  ? Color, Urine ORANGE (*)   ? Bilirubin Urine SMALL (*)   ? Ketones, ur 15 (*)   ? Protein, ur 30 (*)   ? Nitrite POSITIVE (*)   ? Leukocytes,Ua TRACE (*)   ? All other components  within normal limits  ?URINALYSIS, MICROSCOPIC (REFLEX) - Abnormal; Notable for the following components:  ? Bacteria, UA MANY (*)   ? All other components within normal limits  ?URINE CULTURE  ? ? ?EKG ?EKG Interpretation ? ?Date/Time:  Thursday May 28 2021 09:35:49 EDT ?Ventricular Rate:  71 ?PR Interval:  150 ?QRS Duration: 83 ?QT Interval:  376 ?QTC Calculation: 409 ?R Axis:   66 ?Text Interpretation: Sinus rhythm Abnormal R-wave progression, early transition Confirmed by Vanetta Mulders [phone]) on 05/28/2021 9:41:52 AM ? ?Radiology ?CT ABDOMEN PELVIS W CONTRAST ? ?Result Date: 05/27/2021 ?CLINICAL DATA:  Flank pain.  Kidney stones suspected. EXAM: CT ABDOMEN AND PELVIS WITH CONTRAST TECHNIQUE: Multidetector CT imaging of the abdomen and pelvis was performed using the standard protocol following bolus administration of intravenous contrast. RADIATION DOSE REDUCTION: This exam was performed according to the departmental dose-optimization program which includes automated exposure control, adjustment of the mA and/or kV according to patient size and/or use of iterative reconstruction technique. CONTRAST:  OMNIPAQUE IOHEXOL 300 MG/ML  SOLN COMPARISON:  None. FINDINGS: Lower chest: Lung bases are clear. Hepatobiliary: No focal hepatic lesion. No biliary duct dilatation. Common bile duct is normal. Pancreas: Pancreas is normal. No ductal dilatation. No pancreatic  inflammation. Spleen: Normal spleen Adrenals/urinary tract: Adrenal glands and kidneys are normal. The ureters and bladder normal. Stomach/Bowel: Stomach, small bowel, appendix, and cecum are normal. The colon and rectosigmoi

## 2021-05-28 NOTE — Discharge Instructions (Signed)
Stop all current medications.  Start taking the Cipro for the urinary tract or kidney infection.  Take the Benadryl every 6 hours for the next 24 hours.  Pepcid for the next 7 days.  Prednisone for the next 5 days.  Return for any new or worse symptoms. ?

## 2021-05-28 NOTE — ED Notes (Signed)
Patient arrived with complaints of an allergic reaction and feelings of throat swelling. Patient able to complete sentences during triage. Airway appears to patent with no signs of airway compromise and no stridor noted. Patient denies using benadryl prior to arrival. VS stable at this time.  ?

## 2021-05-29 LAB — URINE CULTURE: Culture: NO GROWTH

## 2021-06-15 ENCOUNTER — Other Ambulatory Visit: Payer: Self-pay

## 2021-06-15 ENCOUNTER — Emergency Department (HOSPITAL_BASED_OUTPATIENT_CLINIC_OR_DEPARTMENT_OTHER): Payer: Medicaid Other

## 2021-06-15 ENCOUNTER — Emergency Department (HOSPITAL_BASED_OUTPATIENT_CLINIC_OR_DEPARTMENT_OTHER)
Admission: EM | Admit: 2021-06-15 | Discharge: 2021-06-15 | Disposition: A | Discharge status: Home / Self Care | Payer: Medicaid Other | Attending: Emergency Medicine | Admitting: Emergency Medicine

## 2021-06-15 ENCOUNTER — Encounter (HOSPITAL_BASED_OUTPATIENT_CLINIC_OR_DEPARTMENT_OTHER): Payer: Self-pay

## 2021-06-15 DIAGNOSIS — S20369A Insect bite (nonvenomous) of unspecified front wall of thorax, initial encounter: Secondary | ICD-10-CM | POA: Insufficient documentation

## 2021-06-15 DIAGNOSIS — S40261A Insect bite (nonvenomous) of right shoulder, initial encounter: Secondary | ICD-10-CM | POA: Diagnosis not present

## 2021-06-15 DIAGNOSIS — R21 Rash and other nonspecific skin eruption: Secondary | ICD-10-CM | POA: Diagnosis present

## 2021-06-15 DIAGNOSIS — Z79899 Other long term (current) drug therapy: Secondary | ICD-10-CM | POA: Insufficient documentation

## 2021-06-15 DIAGNOSIS — Z7952 Long term (current) use of systemic steroids: Secondary | ICD-10-CM | POA: Diagnosis not present

## 2021-06-15 DIAGNOSIS — R0602 Shortness of breath: Secondary | ICD-10-CM | POA: Diagnosis not present

## 2021-06-15 DIAGNOSIS — R0789 Other chest pain: Secondary | ICD-10-CM

## 2021-06-15 DIAGNOSIS — W57XXXA Bitten or stung by nonvenomous insect and other nonvenomous arthropods, initial encounter: Secondary | ICD-10-CM | POA: Diagnosis not present

## 2021-06-15 DIAGNOSIS — J45909 Unspecified asthma, uncomplicated: Secondary | ICD-10-CM | POA: Insufficient documentation

## 2021-06-15 MED ORDER — DEXAMETHASONE SODIUM PHOSPHATE 10 MG/ML IJ SOLN
10.0000 mg | Freq: Once | INTRAMUSCULAR | Status: AC
  Administered 2021-06-15: 10 mg via INTRAMUSCULAR
  Filled 2021-06-15: qty 1

## 2021-06-15 MED ORDER — DIPHENHYDRAMINE HCL 25 MG PO CAPS
25.0000 mg | ORAL_CAPSULE | Freq: Once | ORAL | Status: AC
  Administered 2021-06-15: 25 mg via ORAL
  Filled 2021-06-15: qty 1

## 2021-06-15 NOTE — ED Notes (Signed)
RT Note: Pt. seen for RT assessment in Isolation area for," S.O.B. and possible Allergic reaction after being bitten by Bed Bugs", along with her daughter. RT Assessment placed on Epic Chart. ?

## 2021-06-15 NOTE — ED Triage Notes (Signed)
Patient here POV from Home with SOB. ? ?Patient states she went on a [address] and came back today. Patient noted Bed Bugs in Living Area at [place]. ? ?Endorses History of Asthma and became SOB this PM. Used Inhaler with Mild Relief.  ? ?NAD Noted during Triage. A&Ox4. GCS 15. Ambulatory.  ?

## 2021-06-15 NOTE — ED Notes (Addendum)
Patient speaking in complete sentences, c/o shob upon exertion, and is c/o itching from alleged bed bug bites. ?

## 2021-06-15 NOTE — ED Notes (Signed)
Patient verbalizes understanding of discharge instructions. Opportunity for questioning and answers were provided. Armband removed by staff, pt discharged from ED to home via POV  

## 2021-06-15 NOTE — ED Provider Notes (Signed)
?[facility] DEPT ?Provider Note ? ? ?CSN: [id number] ?Arrival date & time: 06/15/21  1357 ? ?  ? ?History ? ?Chief Complaint  ?Patient presents with  ? Shortness of Breath  ? ? ?Amy Church is a 36 y.o. female. ? ?Patient with history of asthma presents today with complaints of shortness of breath and rash.  She states that over the [place] resort, woke up in the morning with a rash, was found to have bedbugs.  States that she has bites all over her body that are very itchy in nature.  She has not tried anything for this.  States that she developed some right shoulder and chest pain and shortness of breath this morning.  Pain is reproducible to palpation and only present with deep inspiration.  Does state that she pulled a heavy wagon around all day yesterday.  Denies any leg pain, leg swelling, history of blood clots.  She is not on oral contraceptives.  She denies fevers or chills. ? ? ?Shortness of Breath ?Associated symptoms: rash   ?Associated symptoms: no cough and no wheezing   ? ?  ? ?Home Medications ?Prior to Admission medications   ?Medication Sig Start Date End Date Taking? Authorizing Provider  ?AMBULATORY NON FORMULARY MEDICATION Blood pressure cuff/Monitored regularly at home.  ICD 10: ZO09.90 08/07/18   Levie Heritage, DO  ?amLODipine (NORVASC) 5 MG tablet Take 1 tablet (5 mg total) by mouth daily. 03/07/20   Elson Areas, PA-C  ?ciprofloxacin (CIPRO) 500 MG tablet Take 1 tablet (500 mg total) by mouth 2 (two) times daily. 05/28/21   Vanetta Mulders, MD  ?cyclobenzaprine (FLEXERIL) 5 MG tablet Take 1 tablet (5 mg total) by mouth 3 (three) times daily as needed for muscle spasms. DO NOT DRINK ALCOHOL OR DRIVE WHILE TAKING THIS MEDICATION 02/11/20   Particia Nearing, PA-C  ?diphenhydrAMINE (BENADRYL) 25 MG tablet Take 1 tablet (25 mg total) by mouth every 6 (six) hours as needed for allergies. 05/28/21   Vanetta Mulders, MD  ?famotidine (PEPCID) 20 MG  tablet Take 1 tablet (20 mg total) by mouth 2 (two) times daily. 05/28/21   Vanetta Mulders, MD  ?HYDROcodone-homatropine [facility]) 5-1.5 MG/5ML syrup Take 5 mLs by mouth every 6 (six) hours as needed for cough. 03/07/20   Elson Areas, PA-C  ?ondansetron (ZOFRAN-ODT) 4 MG disintegrating tablet Take 1 tablet (4 mg total) by mouth every 8 (eight) hours as needed for nausea or vomiting. 05/31/19   Mardella Layman, MD  ?phenazopyridine (PYRIDIUM) 200 MG tablet Take 1 tablet (200 mg total) by mouth 3 (three) times daily as needed for pain. 05/27/21   Alvira Monday, MD  ?predniSONE (DELTASONE) 10 MG tablet Take 4 tablets (40 mg total) by mouth daily. 05/28/21   Vanetta Mulders, MD  ?   ? ?Allergies    ?Penicillins   ? ?Review of Systems   ?Review of Systems  ?Respiratory:  Positive for shortness of breath. Negative for apnea, cough, choking, chest tightness, wheezing and stridor.   ?Skin:  Positive for rash.  ?All other systems reviewed and are negative. ? ?Physical Exam ?Updated Vital Signs ?BP 140/87   Pulse 92   Temp 98.3 ?F (36.8 ?C) (Oral)   Resp 18   Ht 5\' 6"  (1.676 m)   Wt 127 kg   LMP  (LMP Unknown)   SpO2 99%   BMI 45.19 kg/m?  ?Physical Exam ?Vitals and nursing note reviewed.  ?Constitutional:   ?  General: She is not in acute distress. ?   Appearance: Normal appearance. She is normal weight. She is not ill-appearing, toxic-appearing or diaphoretic.  ?HENT:  ?   Head: Normocephalic and atraumatic.  ?Cardiovascular:  ?   Rate and Rhythm: Normal rate and regular rhythm.  ?   Heart sounds: Normal heart sounds.  ?Pulmonary:  ?   Effort: Pulmonary effort is normal. No respiratory distress.  ?   Breath sounds: Normal breath sounds.  ?Chest:  ?   Chest wall: Tenderness present.  ?   Comments: Tenderness to palpation under right breast.  Several bites present in this area noninfectious appearing. ?Musculoskeletal:     ?   General: Normal range of motion.  ?   Cervical back: Normal range of motion.  ?   Right  lower leg: No tenderness. No edema.  ?   Left lower leg: No tenderness. No edema.  ?Skin: ?   General: Skin is warm and dry.  ?   Comments: Several raised and excoriated noninfectious appearing lesions located all over the patient's body  ?Neurological:  ?   General: No focal deficit present.  ?   Mental Status: She is alert.  ?Psychiatric:     ?   Mood and Affect: Mood normal.     ?   Behavior: Behavior normal.  ? ? ?ED Results / Procedures / Treatments   ?Labs ?(all labs ordered are listed, but only abnormal results are displayed) ?Labs Reviewed - No data to display ? ?EKG ?None ? ?Radiology ?DG Chest Port 1 View ? ?Result Date: 06/15/2021 ?CLINICAL DATA:  Chest pain. EXAM: PORTABLE CHEST 1 VIEW COMPARISON:  Chest x-ray 07/21/2020. FINDINGS: The heart size and mediastinal contours are within normal limits. Both lungs are clear. The visualized skeletal structures are unremarkable. IMPRESSION: No active disease. Electronically Signed   By: Darliss Cheney M.D.   On: 06/15/2021 18:57   ? ?Procedures ?Procedures  ? ? ?Medications Ordered in ED ?Medications  ?diphenhydrAMINE (BENADRYL) capsule 25 mg (25 mg Oral Given 06/15/21 1932)  ?dexamethasone (DECADRON) injection 10 mg (10 mg Intramuscular Given 06/15/21 1959)  ? ? ?ED Course/ Medical Decision Making/ A&P ?  ?                        ?Medical Decision Making ? ?Patient presents today with complaints of chest pain, shortness of breath with concerns of bed bug bites all over her body. She is afebrile, nontoxic-appearing, and in no acute distress with reassuring vital signs.   Chest x-ray without signs of acute findings.  I have reviewed this imaging and agree with radiology interpretation.  EKG shows NSR. She is PERC negative. Pain is reproducible to palpation of right chest and shoulder with palpable MSK tightness and tenderness. Patient does state that she pulled a heavy bucket around with her right arm yesterday. Suspect this is the etiology of the patients symptoms.  Very low suspicion for ACS, PE, pneumothorax, cardiac tamponade, or any other acute emergency at this time. She states that she is mostly here with concern for her bug bites. Observed same which are noninfectious appearing. Plan to give benadryl and steroids for same. Patient is understanding and amenable with plan. Educated on red flag symptoms that would prompt immediate return. Discharged in stable condition. ? ?Final Clinical Impression(s) / ED Diagnoses ?Final diagnoses:  ?Chest wall pain  ?Bedbug bite, initial encounter  ? ? ?Rx / DC Orders ?ED Discharge Orders   ? ?  None  ? ?  ?An After Visit Summary was printed and given to the patient. ? ? ?  ?Silva BandySmoot, Jessey Stehlin A, PA-C ?06/15/21 2009 ? ?  ?Franne FortsGray, Alicia P, DO ?06/16/21 [id number] ? ?

## 2021-06-15 NOTE — ED Notes (Signed)
Luisa Hart, RT notified for Assessment. ?

## 2021-06-15 NOTE — Discharge Instructions (Signed)
As we discussed, your work-up in the itchiness reassuring for acute abnormalities.  Have given you a shot of steroids for management of your bites which should help with swelling and itching.  I have also given you Benadryl which you can take over-the-counter as needed for additional relief from itching.  Make sure that you keep areas that were bitten and clean and monitor for signs of infection. ? ?Return if development of any new or worsening symptoms. ?

## 2021-08-01 ENCOUNTER — Ambulatory Visit
Admission: EM | Admit: 2021-08-01 | Discharge: 2021-08-01 | Disposition: A | Discharge status: Home / Self Care | Payer: Medicaid Other | Attending: Emergency Medicine | Admitting: Emergency Medicine

## 2021-08-01 DIAGNOSIS — M25512 Pain in left shoulder: Secondary | ICD-10-CM

## 2021-08-01 DIAGNOSIS — S46911A Strain of unspecified muscle, fascia and tendon at shoulder and upper arm level, right arm, initial encounter: Secondary | ICD-10-CM

## 2021-08-01 MED ORDER — BACLOFEN 10 MG PO TABS
10.0000 mg | ORAL_TABLET | Freq: Every day | ORAL | 0 refills | Status: AC
Start: 2021-08-01 — End: 2021-08-08

## 2021-08-01 MED ORDER — ACETAMINOPHEN 500 MG PO TABS
1000.0000 mg | ORAL_TABLET | Freq: Three times a day (TID) | ORAL | 0 refills | Status: AC
Start: 2021-08-01 — End: 2021-08-31

## 2021-08-01 MED ORDER — IBUPROFEN 600 MG PO TABS: 600.0000 mg | ORAL_TABLET | Freq: Three times a day (TID) | ORAL | 0 refills | Status: DC | PRN

## 2021-08-01 MED ORDER — DICLOFENAC SODIUM 1 % EX GEL: 4.0000 g | Freq: Four times a day (QID) | CUTANEOUS | 2 refills | Status: DC

## 2021-08-01 MED ORDER — KETOROLAC TROMETHAMINE 60 MG/2ML IM SOLN
60.0000 mg | Freq: Once | INTRAMUSCULAR | Status: AC
  Administered 2021-08-01: 60 mg via INTRAMUSCULAR

## 2021-08-01 NOTE — Discharge Instructions (Signed)
Please read the enclosed information regarding muscle strains and management at home.  The mainstay of therapy for musculoskeletal pain is reduction of inflammation and relaxation of tension which is causing inflammation.  Keep in mind, pain always begets more pain.  To help you stay ahead of your pain and inflammation, I have provided the following regimen for you:   During your visit today, you received an injection of ketorolac, high-dose nonsteroidal anti-inflammatory pain medication that should significantly reduce your pain for the next 6 to 8 hours.    Please begin taking Tylenol 1000 mg 3 times daily (every 8 hours) as soon as you pick up your prescriptions from the pharmacy.   This evening, please begin taking baclofen 10 mg.  This is a highly effective muscle relaxer and antispasmodic which should continue to provide you with relaxation of your tense muscles, allow you to sleep well and to keep your pain under control.  This evening, please begin taking ibuprofen 600 mg 3 times daily.  Please keep in mind that it is always easier to treat a little bit of pain that is to treat a lot of pain.  I recommend that for the next several days, you take this medication on a scheduled basis.  After that, take it when you begin to feel the pain returning, do not wait until you are in a lot of pain.   During the day, please set aside time to apply ice to the affected area 4 times daily for 20 minutes each application.  This can be achieved by using a bag of frozen peas or corn, a Ziploc bag filled with ice and water, or Ziploc bag filled with half rubbing alcohol and half Dawn dish detergent, frozen into a slush.  Please be careful not to apply ice directly to your skin, always place a soft cloth between you and the ice pack.   You are welcome to use topical anti-inflammatory creams such as Voltaren gel, capsaicin or Aspercreme as recommended.  These medications are available over-the-counter, please  follow manufactures instructions for use.  As a courtesy, I provided you with a prescription for diclofenac in the event that your insurance will pay for this.   Please consider discussing referral to physical therapy with your primary care provider.  Physical therapist are very good at teasing out the underlying cause of acute lower back pain and helping with prevention of future recurrences.   Please avoid attempts to stretch or strengthen the affected area until you are feeling completely pain-free.  Attempts to do so will only prolong the healing process.   If you would like to try to return return to urgent care in the next 2 to 3 days for repeat ketorolac injection, you are welcome to do so.   I also recommend that you remain out of work for the next several days, I provided you with a note to return to work in 3 days.  If you feel that you need this time extended, please follow-up with your primary care provider or return to urgent care for reevaluation so that we can provide you with a note for another 3 days.   Thank you for visiting urgent care today.  We appreciate the opportunity to participate in your care.

## 2021-08-01 NOTE — ED Triage Notes (Signed)
Pt c/o right and left shoulder/ arm pain post cleaning and lifting in her home. The patient states the pain is worse in the right arm.

## 2021-08-01 NOTE — ED Provider Notes (Signed)
[facility]    CSN: [id number] Arrival date & time: 08/01/21  0818    HISTORY   Chief Complaint  Patient presents with   Shoulder Pain   HPI Amy Church is a 36 y.o. female. Patient states she does some spring cleaning last weekend, states she had to move her sectional sofa of the way to vacuum underneath it and thinks that she may have strained her shoulders, right greater than left.  Patient demonstrates that she is unable to lift her right upper arm higher than parallel to the floor but states her daughter, who is 55, is able to passively move her upper arm around in all directions but with a significant mount of pain.  Patient states she has full active range of motion of her left shoulder and demonstrates a small thing so.  Patient states her left shoulder is very sore as well but not as bad as the right.  Patient states she has been taking 200 mg of ibuprofen which is ineffective.  Patient states she is also been applying ice blocks, the kind that you pack and to lunch boxes to keep your lunch cold, to her shoulder which feels very good.  Patient states she has been trying to stretch and move her arm is much as possible because she does not want to get "locked".  Patient denies prior injury to her right shoulder.  The history is provided by the patient.  Past Medical History:  Diagnosis Date   Chronic fungal otitis externa    Gestational diabetes    Hypertension    Did not have during previous pregnancy   Temporomandibular jaw dysfunction    Patient Active Problem List   Diagnosis Date Noted   History of postpartum hemorrhage 03/28/2019   History of pre-eclampsia 02/24/2019   Unwanted fertility 02/09/2019   History of gestational diabetes 12/17/2018   BMI 40.0-44.9, adult ([place]) 10/23/2018   Chronic hypertension 08/07/2018   Past Surgical History:  Procedure Laterality Date   TUBAL LIGATION Bilateral 02/25/2019   Procedure: POST PARTUM TUBAL LIGATION;   Surgeon: Truett Mainland, DO;  Location: [facility];  Service: Gynecology;  Laterality: Bilateral;   WISDOM TOOTH EXTRACTION     OB History     Gravida  4   Para  2   Term  1   Preterm  1   AB  2   Living  2      SAB      IAB  2   Ectopic      Multiple  0   Live Births  2          Home Medications    Prior to Admission medications   Medication Sig Start Date End Date Taking? Authorizing Provider  AMBULATORY NON FORMULARY MEDICATION Blood pressure cuff/Monitored regularly at home.  ICD 10: ZO09.90 08/07/18   Truett Mainland, DO  amLODipine (NORVASC) 5 MG tablet Take 1 tablet (5 mg total) by mouth daily. 03/07/20   Fransico Meadow, PA-C  famotidine (PEPCID) 20 MG tablet Take 1 tablet (20 mg total) by mouth 2 (two) times daily. 05/28/21   Fredia Sorrow, MD  ondansetron (ZOFRAN-ODT) 4 MG disintegrating tablet Take 1 tablet (4 mg total) by mouth every 8 (eight) hours as needed for nausea or vomiting. 05/31/19   Vanessa Kick, MD    Family History Family History  Problem Relation Age of Onset   Heart disease Paternal Grandmother    Hypertension Paternal Grandmother  Diabetes Paternal Grandmother    Cancer Mother        pelvic   Social History Social History   Tobacco Use   Smoking status: Never   Smokeless tobacco: Never  Vaping Use   Vaping Use: Never used  Substance Use Topics   Alcohol use: Not Currently   Drug use: Not Currently    Types: Marijuana    Comment: stopped with +UPT   Allergies   Penicillins  Review of Systems Review of Systems Pertinent findings noted in history of present illness.   Physical Exam Triage Vital Signs ED Triage Vitals  Enc Vitals Group     BP 12/26/20 0827 (!) 147/82     Pulse Rate 12/26/20 0827 72     Resp 12/26/20 0827 18     Temp 12/26/20 0827 98.3 F (36.8 C)     Temp Source 12/26/20 0827 Oral     SpO2 12/26/20 0827 98 %     Weight --      Height --      Head Circumference --      Peak Flow --       Pain Score 12/26/20 0826 5     Pain Loc --      Pain Edu? --      Excl. in [place]? --   No data found.  Updated Vital Signs BP 139/90 (BP Location: Left Arm)   Pulse 80   Temp 98.2 F (36.8 C) (Oral)   Resp 18   LMP  (LMP Unknown)   SpO2 98%   Physical Exam Vitals and nursing note reviewed.  Constitutional:      General: She is awake. She is not in acute distress.    Appearance: Normal appearance. She is well-developed and well-groomed. She is morbidly obese. She is not ill-appearing, toxic-appearing or diaphoretic.  Musculoskeletal:     Right shoulder: Tenderness (biceps insertion, deltoid) present. No swelling, deformity, effusion, laceration, bony tenderness or crepitus. Decreased range of motion (Unable to fully extend anteriorly, posteriorly or laterally). Decreased strength.     Left shoulder: Tenderness (Supraspinatus, upper trapezius) present. No swelling, deformity, effusion, laceration, bony tenderness or crepitus. Normal range of motion. Normal strength. Normal pulse.     Right upper arm: Tenderness (Upper biceps) present. No swelling, edema, deformity, lacerations or bony tenderness.     Left upper arm: Normal. No swelling, edema, deformity, lacerations, tenderness or bony tenderness.     Cervical back: Spasms and tenderness present. No swelling, edema, deformity, erythema, signs of trauma, lacerations, rigidity, torticollis, bony tenderness or crepitus. Pain with movement present. Normal range of motion.     Thoracic back: Normal.     Lumbar back: Normal.  Neurological:     Mental Status: She is alert.  Psychiatric:        Behavior: Behavior is cooperative.    Visual Acuity Right Eye Distance:   Left Eye Distance:   Bilateral Distance:    Right Eye Near:   Left Eye Near:    Bilateral Near:     [facility] Couse / Diagnostics / Procedures:    EKG  Radiology No results found.  Procedures Procedures (including critical care time)  UC Diagnoses / Final Clinical  Impressions(s)   I have reviewed the triage vital signs and the nursing notes.  Pertinent labs & imaging results that were available during my care of the patient were reviewed by me and considered in my medical decision making (see chart for details).  Final diagnoses:  Muscle strain of right shoulder, initial encounter  Acute pain of left shoulder    Patient was provided with an injection of ketorolac during their visit today for acute pain relief.  Patient was advised to: take Ibuprofen 600 mg 3 times daily for the next 5 to 7 days,  , Take baclofen once daily 1 hour prior to bedtime,  , Apply ice pack to affected area 4 times daily for 20 minutes each time,  , Apply topical Voltaren gel 4 times daily as needed,  , Avoid stretching or strengthening exercises until pain is completely resolved,  , Return to urgent care in the next 2 to 3 days for repeat ketorolac injection if needed,  , Consider physical therapy, chiropractic care, orthopedic follow-up,  , and Return precautions advised   ED Prescriptions     Medication Sig Dispense Auth. Provider   baclofen (LIORESAL) 10 MG tablet Take 1 tablet (10 mg total) by mouth at bedtime for 7 days. 7 tablet Lynden Oxford Scales, PA-C   ibuprofen (ADVIL) 600 MG tablet Take 1 tablet (600 mg total) by mouth every 8 (eight) hours as needed for up to 30 doses for fever, headache, mild pain or moderate pain (Inflammation). Take 1 tablet 3 times daily as needed for inflammation of upper airways and/or pain. 30 tablet Lynden Oxford Scales, PA-C   acetaminophen (TYLENOL) 500 MG tablet Take 2 tablets (1,000 mg total) by mouth every 8 (eight) hours. 180 tablet Lynden Oxford Scales, PA-C   diclofenac Sodium (VOLTAREN) 1 % GEL Apply 4 g topically 4 (four) times daily. Apply to affected areas 4 times daily as needed for pain. 100 g Lynden Oxford Scales, PA-C      PDMP not reviewed this encounter.  Pending results:  Labs Reviewed - No data to  display  Medications Ordered in UC: Medications  ketorolac (TORADOL) injection 60 mg (60 mg Intramuscular Given 08/01/21 0908)    Disposition Upon Discharge:  Condition: stable for discharge home Home: take medications as prescribed; routine discharge instructions as discussed; follow up as advised.  Patient presented with an acute illness with associated systemic symptoms and significant discomfort requiring urgent management. In my opinion, this is a condition that a prudent lay person (someone who possesses an average knowledge of health and medicine) may potentially expect to result in complications if not addressed urgently such as respiratory distress, impairment of bodily function or dysfunction of bodily organs.   Routine symptom specific, illness specific and/or disease specific instructions were discussed with the patient and/or caregiver at length.   As such, the patient has been evaluated and assessed, work-up was performed and treatment was provided in alignment with urgent care protocols and evidence based medicine.  Patient/parent/caregiver has been advised that the patient may require follow up for further testing and treatment if the symptoms continue in spite of treatment, as clinically indicated and appropriate.  If the patient was tested for COVID-19, Influenza and/or RSV, then the patient/parent/guardian was advised to isolate at home pending the results of his/her diagnostic coronavirus test and potentially longer if they're positive. I have also advised pt that if his/her COVID-19 test returns positive, it's recommended to self-isolate for at least 10 days after symptoms first appeared AND until fever-free for 24 hours without fever reducer AND other symptoms have improved or resolved. Discussed self-isolation recommendations as well as instructions for household member/close contacts as per the CDC and [place] DHHS, and also gave patient the COVID packet with this  information.  Patient/parent/caregiver has been advised to return to the [facility] or PCP in 3-5 days if no better; to PCP or the Emergency Department if new signs and symptoms develop, or if the current signs or symptoms continue to change or worsen for further workup, evaluation and treatment as clinically indicated and appropriate  The patient will follow up with their current PCP if and as advised. If the patient does not currently have a PCP we will assist them in obtaining one.   The patient may need specialty follow up if the symptoms continue, in spite of conservative treatment and management, for further workup, evaluation, consultation and treatment as clinically indicated and appropriate.   Patient/parent/caregiver verbalized understanding and agreement of plan as discussed.  All questions were addressed during visit.  Please see discharge instructions below for further details of plan.  Discharge Instructions:   Discharge Instructions      Please read the enclosed information regarding muscle strains and management at home.  The mainstay of therapy for musculoskeletal pain is reduction of inflammation and relaxation of tension which is causing inflammation.  Keep in mind, pain always begets more pain.  To help you stay ahead of your pain and inflammation, I have provided the following regimen for you:   During your visit today, you received an injection of ketorolac, high-dose nonsteroidal anti-inflammatory pain medication that should significantly reduce your pain for the next 6 to 8 hours.    Please begin taking Tylenol 1000 mg 3 times daily (every 8 hours) as soon as you pick up your prescriptions from the pharmacy.   This evening, please begin taking baclofen 10 mg.  This is a highly effective muscle relaxer and antispasmodic which should continue to provide you with relaxation of your tense muscles, allow you to sleep well and to keep your pain under control.  This evening,  please begin taking ibuprofen 600 mg 3 times daily.  Please keep in mind that it is always easier to treat a little bit of pain that is to treat a lot of pain.  I recommend that for the next several days, you take this medication on a scheduled basis.  After that, take it when you begin to feel the pain returning, do not wait until you are in a lot of pain.   During the day, please set aside time to apply ice to the affected area 4 times daily for 20 minutes each application.  This can be achieved by using a bag of frozen peas or corn, a Ziploc bag filled with ice and water, or Ziploc bag filled with half rubbing alcohol and half Dawn dish detergent, frozen into a slush.  Please be careful not to apply ice directly to your skin, always place a soft cloth between you and the ice pack.   You are welcome to use topical anti-inflammatory creams such as Voltaren gel, capsaicin or Aspercreme as recommended.  These medications are available over-the-counter, please follow manufactures instructions for use.  As a courtesy, I provided you with a prescription for diclofenac in the event that your insurance will pay for this.   Please consider discussing referral to physical therapy with your primary care provider.  Physical therapist are very good at teasing out the underlying cause of acute lower back pain and helping with prevention of future recurrences.   Please avoid attempts to stretch or strengthen the affected area until you are feeling completely pain-free.  Attempts to do so will only prolong the  healing process.   If you would like to try to return return to urgent care in the next 2 to 3 days for repeat ketorolac injection, you are welcome to do so.   I also recommend that you remain out of work for the next several days, I provided you with a note to return to work in 3 days.  If you feel that you need this time extended, please follow-up with your primary care provider or return to urgent care for  reevaluation so that we can provide you with a note for another 3 days.   Thank you for visiting urgent care today.  We appreciate the opportunity to participate in your care.       This office note has been dictated using Museum/gallery curator.  Unfortunately, and despite my best efforts, this method of dictation can sometimes lead to occasional typographical or grammatical errors.  I apologize in advance if this occurs.     Lynden Oxford Scales, PA-C 08/01/21 1200

## 2021-08-21 ENCOUNTER — Ambulatory Visit
Admission: EM | Admit: 2021-08-21 | Discharge: 2021-08-21 | Disposition: A | Discharge status: Home / Self Care | Payer: Medicaid Other | Attending: Urgent Care | Admitting: Urgent Care

## 2021-08-21 DIAGNOSIS — R102 Pelvic and perineal pain: Secondary | ICD-10-CM | POA: Diagnosis not present

## 2021-08-21 DIAGNOSIS — R3915 Urgency of urination: Secondary | ICD-10-CM | POA: Diagnosis not present

## 2021-08-21 DIAGNOSIS — R350 Frequency of micturition: Secondary | ICD-10-CM | POA: Diagnosis present

## 2021-08-21 DIAGNOSIS — R300 Dysuria: Secondary | ICD-10-CM | POA: Diagnosis not present

## 2021-08-21 LAB — POCT URINALYSIS DIP (MANUAL ENTRY)
Bilirubin, UA: NEGATIVE
Blood, UA: NEGATIVE
Glucose, UA: NEGATIVE mg/dL
Nitrite, UA: NEGATIVE
Protein Ur, POC: NEGATIVE mg/dL
Spec Grav, UA: 1.025 (ref 1.010–1.025)
Urobilinogen, UA: 0.2 E.U./dL
pH, UA: 5.5 (ref 5.0–8.0)

## 2021-08-21 LAB — POCT URINE PREGNANCY: Preg Test, Ur: NEGATIVE

## 2021-08-21 MED ORDER — NITROFURANTOIN MONOHYD MACRO 100 MG PO CAPS: 100.0000 mg | ORAL_CAPSULE | Freq: Two times a day (BID) | ORAL | 0 refills | Status: DC

## 2021-08-23 LAB — URINE CULTURE: Culture: <10000 — AB

## 2021-08-24 LAB — CERVICOVAGINAL ANCILLARY ONLY
Bacterial Vaginitis (gardnerella): POSITIVE — AB
Comment: NEGATIVE

## 2021-08-25 ENCOUNTER — Telehealth (HOSPITAL_COMMUNITY): Payer: Self-pay | Admitting: Emergency Medicine

## 2021-08-25 MED ORDER — METRONIDAZOLE 0.75 % VA GEL
1.0000 | Freq: Every day | VAGINAL | 0 refills | Status: AC
Start: 2021-08-25 — End: 2021-08-30

## 2021-10-29 ENCOUNTER — Ambulatory Visit
Admission: EM | Admit: 2021-10-29 | Discharge: 2021-10-29 | Disposition: A | Discharge status: Home / Self Care | Payer: Medicaid Other

## 2021-10-29 DIAGNOSIS — M5441 Lumbago with sciatica, right side: Secondary | ICD-10-CM

## 2021-10-29 DIAGNOSIS — G8929 Other chronic pain: Secondary | ICD-10-CM | POA: Diagnosis not present

## 2021-10-29 DIAGNOSIS — N3000 Acute cystitis without hematuria: Secondary | ICD-10-CM

## 2021-10-29 LAB — POCT URINALYSIS DIP (MANUAL ENTRY)
Bilirubin, UA: NEGATIVE
Blood, UA: NEGATIVE
Glucose, UA: NEGATIVE mg/dL
Ketones, POC UA: NEGATIVE mg/dL
Nitrite, UA: NEGATIVE
Protein Ur, POC: NEGATIVE mg/dL
Spec Grav, UA: 1.025 (ref 1.010–1.025)
Urobilinogen, UA: 0.2 E.U./dL
pH, UA: 6.5 (ref 5.0–8.0)

## 2021-10-29 LAB — POCT URINE PREGNANCY: Preg Test, Ur: NEGATIVE

## 2021-10-29 MED ORDER — SULFAMETHOXAZOLE-TRIMETHOPRIM 800-160 MG PO TABS: 1.0000 | ORAL_TABLET | Freq: Two times a day (BID) | ORAL | 0 refills | Status: AC

## 2021-10-29 MED ORDER — METHYLPREDNISOLONE 4 MG PO TBPK: ORAL_TABLET | ORAL | 0 refills | Status: DC

## 2021-10-29 MED ORDER — KETOROLAC TROMETHAMINE 60 MG/2ML IM SOLN
60.0000 mg | Freq: Once | INTRAMUSCULAR | Status: AC
  Administered 2021-10-29: 60 mg via INTRAMUSCULAR

## 2021-10-29 MED ORDER — BACLOFEN 10 MG PO TABS: 10.0000 mg | ORAL_TABLET | Freq: Three times a day (TID) | ORAL | 0 refills | Status: AC

## 2021-10-29 NOTE — ED Triage Notes (Signed)
The patient c/o back pain that shoots down her rt leg.  Started: 4-5 DAYS AGO

## 2021-10-29 NOTE — Discharge Instructions (Addendum)
The urinalysis that we performed in the clinic today was abnormal.  You were advised to begin antibiotics today because your urinalysis is abnormal and you are having active symptoms of an acute lower urinary tract infection also known as cystitis.  It is very important that you take all doses exactly as prescribed.  Incomplete antibiotic therapy can cause worsening urinary tract infection that can become aggressive, escape from urinary tract into your bloodstream causing sepsis which will require hospitalization.  Please pick up and begin taking your prescription for Bactrim DS (trimethoprim sulfamethoxazole) as soon as possible.  Please take all doses exactly as prescribed.  You can take this medication with or without food.  This medication is safe to take with your other medications.   If you have not had complete resolution of your urinary symptoms after completing treatment as prescribed, please return to urgent care for repeat evaluation or follow-up with your primary care provider.  Please go to [facility] to have the x-ray of your lumbar spine done.  If you go during regular business hours, please go to the main entrance and if you go after business hours or on the weekend, go to the emergency room entrance.  You will not need to be admitted as a patient, just let them know that you were seen in urgent care and need to have an x-ray done.  Once I receive the results of your x-ray, we will contact you to let you know what it showed.  The mainstay of therapy for musculoskeletal pain is reduction of inflammation and relaxation of tension which is causing inflammation.  Keep in mind, pain always begets more pain.  To help you stay ahead of your pain and inflammation, I have provided the following regimen for you:   During your visit today, you received an injection of ketorolac, high-dose nonsteroidal anti-inflammatory pain medication that should significantly  reduce your pain for the next 6 to 8 hours.    This evening, you can begin taking baclofen 10 mg.  This is a highly effective muscle relaxer and antispasmodic which should continue to provide you with relaxation of your tense muscles, allow you to sleep well and to keep your pain under control.  You can continue taking this medication 3 times daily as you need to.  If you find that this medication makes you too sleepy, you can break them in half for your daytime doses and, if needed double them for your nighttime dose.  Do not take more than 30 mg of baclofen in a 24-hour period.   Tomorrow morning, please begin taking methylprednisolone.  Please take 1 full row tablets at once with your breakfast meal.  If you have had significant resolution of your pain before you finish the entire prescription, please feel free to discontinue.  It is not important to finish every ta dose blet.   During the day, please set aside time to apply ice to the affected area 4 times daily for 20 minutes each application.  This can be achieved by using a bag of frozen peas or corn, a Ziploc bag filled with ice and water, or Ziploc bag filled with half rubbing alcohol and half Dawn dish detergent, frozen into a slush.  Please be careful not to apply ice directly to your skin, always place a soft cloth between you and the ice pack.   Please consider discussing referral to physical therapy with your primary care provider.  Physical therapist are  very good at teasing out the underlying cause of acute lower back pain and helping with prevention of future recurrences.   Please avoid attempts to stretch or strengthen the affected area until you are feeling completely pain-free.  Attempts to do so will only prolong the healing process.   If you would like to try to return return to urgent care in the next 2 to 3 days for repeat ketorolac injection, you are welcome to do so.   I also recommend that you remain out of work for the next  several days, I provided you with a note to return to work in 3 days.  If you feel that you need this time extended, please follow-up with your primary care provider or return to urgent care for reevaluation so that we can provide you with a note for another 3 days.   Thank you for visiting urgent care today.  We appreciate the opportunity to participate in your care.

## 2021-10-29 NOTE — ED Provider Notes (Signed)
[facility]    CSN: [id number] Arrival date & time: 10/29/21  1923    HISTORY   Chief Complaint  Patient presents with   Back Pain   HPI Amy Church is a pleasant, 36 y.o. female who presents to urgent care today. The patient c/o recurrent lower back pain that shoots down her rt leg that flared up for 5 days ago.  Patient states she has been wearing a back brace and has found a way to sit comfortably without pain but states the pain is not getting better on its own.  Patient also complains of incontinence of urine.  Patient endorses increased frequency of urination, increased urge to urinate, and incontinence of urine.  Patient denies increased frequency of urination, increased urge to urinate, and incontinence of urine.  abnormal odor of urine, burning with urination, sensation of incomplete emptying, suprapubic pain, perineal pain, altered mental status, left-sided flank pain, right-sided flank pain, chills, malaise, rigors, significant fatigue, abnormal vaginal discharge, vaginal itching, vaginal irritation, dyspareunia, genital lesion(s), and possible exposure to STD.  Patient endorses history of frequent UTIs.  The history is provided by the patient.   Past Medical History:  Diagnosis Date   Chronic fungal otitis externa    Gestational diabetes    Hypertension    Did not have during previous pregnancy   Temporomandibular jaw dysfunction    Patient Active Problem List   Diagnosis Date Noted   History of postpartum hemorrhage 03/28/2019   History of pre-eclampsia 02/24/2019   Unwanted fertility 02/09/2019   History of gestational diabetes 12/17/2018   BMI 40.0-44.9, adult (HCC) 10/23/2018   Chronic hypertension 08/07/2018   Past Surgical History:  Procedure Laterality Date   TUBAL LIGATION Bilateral 02/25/2019   Procedure: POST PARTUM TUBAL LIGATION;  Surgeon: Levie Heritage, DO;  Location: [facility];  Service: Gynecology;  Laterality: Bilateral;   WISDOM  TOOTH EXTRACTION     OB History     Gravida  4   Para  2   Term  1   Preterm  1   AB  2   Living  2      SAB      IAB  2   Ectopic      Multiple  0   Live Births  2          Home Medications    Prior to Admission medications   Medication Sig Start Date End Date Taking? Authorizing Provider  AMBULATORY NON FORMULARY MEDICATION Blood pressure cuff/Monitored regularly at home.  ICD 10: ZO09.90 08/07/18   Levie Heritage, DO  amLODipine (NORVASC) 5 MG tablet Take 1 tablet (5 mg total) by mouth daily. 03/07/20   Elson Areas, PA-C  diclofenac Sodium (VOLTAREN) 1 % GEL Apply 4 g topically 4 (four) times daily. Apply to affected areas 4 times daily as needed for pain. 08/01/21   Theadora Rama Scales, PA-C  famotidine (PEPCID) 20 MG tablet Take 1 tablet (20 mg total) by mouth 2 (two) times daily. 05/28/21   Vanetta Mulders, MD  ibuprofen (ADVIL) 600 MG tablet Take 1 tablet (600 mg total) by mouth every 8 (eight) hours as needed for up to 30 doses for fever, headache, mild pain or moderate pain (Inflammation). Take 1 tablet 3 times daily as needed for inflammation of upper airways and/or pain. 08/01/21   Theadora Rama Scales, PA-C    Family History Family History  Problem Relation Age of Onset   Heart disease  Paternal Grandmother    Hypertension Paternal Grandmother    Diabetes Paternal Grandmother    Cancer Mother        pelvic   Social History Social History   Tobacco Use   Smoking status: Never   Smokeless tobacco: Never  Vaping Use   Vaping Use: Never used  Substance Use Topics   Alcohol use: Not Currently   Drug use: Not Currently    Types: Marijuana    Comment: stopped with +UPT   Allergies   Penicillins  Review of Systems Review of Systems Pertinent findings revealed after performing a 14 point review of systems has been noted in the history of present illness.  Physical Exam Triage Vital Signs ED Triage Vitals  Enc Vitals Group     BP  12/26/20 0827 (!) 147/82     Pulse Rate 12/26/20 0827 72     Resp 12/26/20 0827 18     Temp 12/26/20 0827 98.3 F (36.8 C)     Temp Source 12/26/20 0827 Oral     SpO2 12/26/20 0827 98 %     Weight --      Height --      Head Circumference --      Peak Flow --      Pain Score 12/26/20 0826 5     Pain Loc --      Pain Edu? --      Excl. in GC? --    Updated Vital Signs BP (!) 117/90 (BP Location: Left Arm)   Pulse 97   Temp 99.2 F (37.3 C) (Oral)   Resp 18   LMP 09/09/2021 (Approximate)   SpO2 97%   Physical Exam  [facility] / Diagnostics / Procedures:     Radiology No results found.  Procedures Procedures (including critical care time) EKG  Pending results:  Labs Reviewed  POCT URINALYSIS DIP (MANUAL ENTRY) - Abnormal; Notable for the following components:      Result Value   Clarity, UA cloudy (*)    Leukocytes, UA Moderate (2+) (*)    All other components within normal limits  POCT URINE PREGNANCY    Medications Ordered in UC: Medications  ketorolac (TORADOL) injection 60 mg (60 mg Intramuscular Given 10/29/21 2004)    [facility] Diagnoses / Final Clinical Impressions(s)   I have reviewed the triage vital signs and the nursing notes.  Pertinent labs & imaging results that were available during my care of the patient were reviewed by me and considered in my medical decision making (see chart for details).    Final diagnoses:  Chronic bilateral low back pain with right-sided sciatica  Acute cystitis without hematuria   Due to pt's c/o increased frequency of urination, increased urge to urinate, and incontinence of urine as well as pyuria, will treat patient empirically for presumed acute cystitis with a 3-day course of Bactrim.  Patient was also provided with prescriptions for baclofen and methylprednisolone for her recurrent lower back pain with right-sided sciatica.  Patient was assisted in finding a primary care provider so that she could follow-up regarding her  lower back pain and discuss referral to physical therapy to prevent future episodes.  Return precautions advised.   ED Prescriptions     Medication Sig Dispense Auth. Provider   baclofen (LIORESAL) 10 MG tablet Take 1 tablet (10 mg total) by mouth 3 (three) times daily for 7 days. 21 tablet Theadora Rama Scales, PA-C   methylPREDNISolone (MEDROL DOSEPAK) 4 MG TBPK tablet Take  24 mg on day 1, 20 mg on day 2, 16 mg on day 3, 12 mg on day 4, 8 mg on day 5, 4 mg on day 6.  Take all tablets in each row at once, do not spread tablets out throughout the day. 21 tablet Theadora RamaMorgan, Noemy Hallmon Scales, PA-C   sulfamethoxazole-trimethoprim (BACTRIM DS) 800-160 MG tablet Take 1 tablet by mouth 2 (two) times daily for 3 days. 6 tablet Theadora RamaMorgan, Sreeja Spies Scales, PA-C      PDMP not reviewed this encounter.  Discharge Instructions:   Discharge Instructions      The urinalysis that we performed in the clinic today was abnormal.  You were advised to begin antibiotics today because your urinalysis is abnormal and you are having active symptoms of an acute lower urinary tract infection also known as cystitis.  It is very important that you take all doses exactly as prescribed.  Incomplete antibiotic therapy can cause worsening urinary tract infection that can become aggressive, escape from urinary tract into your bloodstream causing sepsis which will require hospitalization.  Please pick up and begin taking your prescription for Bactrim DS (trimethoprim sulfamethoxazole) as soon as possible.  Please take all doses exactly as prescribed.  You can take this medication with or without food.  This medication is safe to take with your other medications.   If you have not had complete resolution of your urinary symptoms after completing treatment as prescribed, please return to urgent care for repeat evaluation or follow-up with your primary care provider.  Please go to [facility] at [id number] to have  the x-ray of your lumbar spine done.  If you go during regular business hours, please go to the main entrance and if you go after business hours or on the weekend, go to the emergency room entrance.  You will not need to be admitted as a patient, just let them know that you were seen in urgent care and need to have an x-ray done.  Once I receive the results of your x-ray, we will contact you to let you know what it showed.  The mainstay of therapy for musculoskeletal pain is reduction of inflammation and relaxation of tension which is causing inflammation.  Keep in mind, pain always begets more pain.  To help you stay ahead of your pain and inflammation, I have provided the following regimen for you:   During your visit today, you received an injection of ketorolac, high-dose nonsteroidal anti-inflammatory pain medication that should significantly reduce your pain for the next 6 to 8 hours.    This evening, you can begin taking baclofen 10 mg.  This is a highly effective muscle relaxer and antispasmodic which should continue to provide you with relaxation of your tense muscles, allow you to sleep well and to keep your pain under control.  You can continue taking this medication 3 times daily as you need to.  If you find that this medication makes you too sleepy, you can break them in half for your daytime doses and, if needed double them for your nighttime dose.  Do not take more than 30 mg of baclofen in a 24-hour period.   Tomorrow morning, please begin taking methylprednisolone.  Please take 1 full row tablets at once with your breakfast meal.  If you have had significant resolution of your pain before you finish the entire prescription, please feel free to discontinue.  It is not important to finish every ta dose blet.   During  the day, please set aside time to apply ice to the affected area 4 times daily for 20 minutes each application.  This can be achieved by using a bag of frozen peas or corn, a  Ziploc bag filled with ice and water, or Ziploc bag filled with half rubbing alcohol and half Dawn dish detergent, frozen into a slush.  Please be careful not to apply ice directly to your skin, always place a soft cloth between you and the ice pack.   Please consider discussing referral to physical therapy with your primary care provider.  Physical therapist are very good at teasing out the underlying cause of acute lower back pain and helping with prevention of future recurrences.   Please avoid attempts to stretch or strengthen the affected area until you are feeling completely pain-free.  Attempts to do so will only prolong the healing process.   If you would like to try to return return to urgent care in the next 2 to 3 days for repeat ketorolac injection, you are welcome to do so.   I also recommend that you remain out of work for the next several days, I provided you with a note to return to work in 3 days.  If you feel that you need this time extended, please follow-up with your primary care provider or return to urgent care for reevaluation so that we can provide you with a note for another 3 days.   Thank you for visiting urgent care today.  We appreciate the opportunity to participate in your care.       Disposition Upon Discharge:  Condition: stable for discharge home Home: take medications as prescribed; routine discharge instructions as discussed; follow up as advised.  Patient presented with an acute illness with associated systemic symptoms and significant discomfort requiring urgent management. In my opinion, this is a condition that a prudent lay person (someone who possesses an average knowledge of health and medicine) may potentially expect to result in complications if not addressed urgently such as respiratory distress, impairment of bodily function or dysfunction of bodily organs.   Routine symptom specific, illness specific and/or disease specific instructions were  discussed with the patient and/or caregiver at length.   As such, the patient has been evaluated and assessed, work-up was performed and treatment was provided in alignment with urgent care protocols and evidence based medicine.  Patient/parent/caregiver has been advised that the patient may require follow up for further testing and treatment if the symptoms continue in spite of treatment, as clinically indicated and appropriate.  Patient/parent/caregiver has been advised to report to orthopedic urgent care clinic or return to the [facility] or PCP in 3-5 days if no better; follow-up with orthopedics, PCP or the Emergency Department if new signs and symptoms develop or if the current signs or symptoms continue to change or worsen for further workup, evaluation and treatment as clinically indicated and appropriate  The patient will follow up with their current PCP if and as advised. If the patient does not currently have a PCP we will have assisted them in obtaining one.   The patient may need specialty follow up if the symptoms continue, in spite of conservative treatment and management, for further workup, evaluation, consultation and treatment as clinically indicated and appropriate.  Patient/parent/caregiver verbalized understanding and agreement of plan as discussed.  All questions were addressed during visit.  Please see discharge instructions below for further details of plan.  This office note has been dictated using Dragon  speech recognition software.  Unfortunately, this method of dictation can sometimes lead to typographical or grammatical errors.  I apologize for your inconvenience in advance if this occurs.  Please do not hesitate to reach out to me if clarification is needed.      Theadora Rama Scales, PA-C 10/31/21 1722

## 2022-09-16 ENCOUNTER — Other Ambulatory Visit: Payer: Self-pay | Admitting: Internal Medicine

## 2022-09-17 LAB — C. TRACHOMATIS/N. GONORRHOEAE RNA
C. trachomatis RNA, TMA: NOT DETECTED
N. gonorrhoeae RNA, TMA: NOT DETECTED

## 2022-10-20 ENCOUNTER — Ambulatory Visit
Admission: EM | Admit: 2022-10-20 | Discharge: 2022-10-20 | Disposition: A | Discharge status: Home / Self Care | Payer: Medicaid Other | Attending: Internal Medicine | Admitting: Internal Medicine

## 2022-10-20 DIAGNOSIS — N760 Acute vaginitis: Secondary | ICD-10-CM | POA: Insufficient documentation

## 2022-10-20 DIAGNOSIS — R1031 Right lower quadrant pain: Secondary | ICD-10-CM | POA: Insufficient documentation

## 2022-10-20 DIAGNOSIS — B9689 Other specified bacterial agents as the cause of diseases classified elsewhere: Secondary | ICD-10-CM | POA: Diagnosis present

## 2022-10-20 DIAGNOSIS — R300 Dysuria: Secondary | ICD-10-CM | POA: Diagnosis present

## 2022-10-20 LAB — POCT URINALYSIS DIP (MANUAL ENTRY)
Bilirubin, UA: NEGATIVE
Glucose, UA: NEGATIVE mg/dL
Ketones, POC UA: NEGATIVE mg/dL
Leukocytes, UA: NEGATIVE
Nitrite, UA: NEGATIVE
Protein Ur, POC: NEGATIVE mg/dL
Spec Grav, UA: 1.025 (ref 1.010–1.025)
Urobilinogen, UA: 0.2 E.U./dL
pH, UA: 5.5 (ref 5.0–8.0)

## 2022-10-20 MED ORDER — KETOROLAC TROMETHAMINE 60 MG/2ML IM SOLN
60.0000 mg | Freq: Once | INTRAMUSCULAR | Status: AC
  Administered 2022-10-20: 60 mg via INTRAMUSCULAR

## 2022-10-20 MED ORDER — METRONIDAZOLE 0.75 % VA GEL
1.0000 | Freq: Every day | VAGINAL | 0 refills | Status: DC
Start: 2022-10-20 — End: 2022-12-27

## 2022-10-20 MED ORDER — NAPROXEN 500 MG PO TABS: 500.0000 mg | ORAL_TABLET | Freq: Two times a day (BID) | ORAL | 0 refills | Status: DC

## 2022-10-20 NOTE — ED Triage Notes (Signed)
Pt reports right lower quadrant pain, right sided low back pain, increase urinary frequency and clear thick vaginal discahrge x 3-4 days

## 2022-10-20 NOTE — ED Provider Notes (Signed)
[facility]  Note:  This document was prepared using Conservation officer, historic buildings and may include unintentional dictation errors.  MRN: [id number] DOB: January 25, 1986  Subjective:   Amy Church is a 37 y.o. female presenting for 3-4 day history of persistent urinary frequency, vaginal discharge, penetrating right lower quadrant/low back/flank/pelvic pain. Has a history of tubal ligation. No chance of pregnancy per patient. Has a history of BV and yeast infection. No fall, trauma, numbness or tingling, saddle paresthesia, changes to bowel or urinary habits, radicular symptoms. No history of renal stones.   No current facility-administered medications for this encounter.  Current Outpatient Medications:    AMBULATORY NON FORMULARY MEDICATION, Blood pressure cuff/Monitored regularly at home.  ICD 10: ZO09.90, Disp: 1 Device, Rfl: 0   amLODipine (NORVASC) 5 MG tablet, Take 1 tablet (5 mg total) by mouth daily., Disp: 30 tablet, Rfl: 1   diclofenac Sodium (VOLTAREN) 1 % GEL, Apply 4 g topically 4 (four) times daily. Apply to affected areas 4 times daily as needed for pain., Disp: 100 g, Rfl: 2   famotidine (PEPCID) 20 MG tablet, Take 1 tablet (20 mg total) by mouth 2 (two) times daily., Disp: 30 tablet, Rfl: 0   ibuprofen (ADVIL) 600 MG tablet, Take 1 tablet (600 mg total) by mouth every 8 (eight) hours as needed for up to 30 doses for fever, headache, mild pain or moderate pain (Inflammation). Take 1 tablet 3 times daily as needed for inflammation of upper airways and/or pain., Disp: 30 tablet, Rfl: 0   methylPREDNISolone (MEDROL DOSEPAK) 4 MG TBPK tablet, Take 24 mg on day 1, 20 mg on day 2, 16 mg on day 3, 12 mg on day 4, 8 mg on day 5, 4 mg on day 6.  Take all tablets in each row at once, do not spread tablets out throughout the day., Disp: 21 tablet, Rfl: 0   tirzepatide (MOUNJARO) 10 MG/0.5ML Pen, Inject 10 mg into the skin once a week., Disp: , Rfl:    Allergies   Allergen Reactions   Penicillins Hives, Shortness Of Breath and Swelling    Has patient had a PCN reaction causing immediate rash, facial/tongue/throat swelling, SOB or lightheadedness with hypotension: yes Has patient had a PCN reaction causing severe rash involving mucus membranes or skin necrosis: no Has patient had a PCN reaction that required hospitalization yes Has patient had a PCN reaction occurring within the last 10 years: yes If all of the above answers are "NO", then may proceed with Cephalosporin use.     Past Medical History:  Diagnosis Date   Chronic fungal otitis externa    Gestational diabetes    Hypertension    Did not have during previous pregnancy   Temporomandibular jaw dysfunction      Past Surgical History:  Procedure Laterality Date   TUBAL LIGATION Bilateral 02/25/2019   Procedure: POST PARTUM TUBAL LIGATION;  Surgeon: Levie Heritage, DO;  Location: [facility];  Service: Gynecology;  Laterality: Bilateral;   WISDOM TOOTH EXTRACTION      Family History  Problem Relation Age of Onset   Heart disease Paternal Grandmother    Hypertension Paternal Grandmother    Diabetes Paternal Grandmother    Cancer Mother        pelvic    Social History   Tobacco Use   Smoking status: Never   Smokeless tobacco: Never  Vaping Use   Vaping status: Never Used  Substance Use Topics  Alcohol use: Not Currently   Drug use: Not Currently    Types: Marijuana    Comment: stopped with +UPT    ROS   Objective:   Vitals: BP 129/73 (BP Location: Left Arm)   Pulse 94   Temp 99.2 F (37.3 C) (Oral)   Resp 18   LMP  (Within Months) Comment: 1 month  SpO2 98%   Physical Exam Constitutional:      General: She is not in acute distress.    Appearance: Normal appearance. She is well-developed. She is not ill-appearing, toxic-appearing or diaphoretic.  HENT:     Head: Normocephalic and atraumatic.     Nose: Nose normal.     Mouth/Throat:     Mouth: Mucous  membranes are moist.  Eyes:     General: No scleral icterus.       Right eye: No discharge.        Left eye: No discharge.     Extraocular Movements: Extraocular movements intact.     Conjunctiva/sclera: Conjunctivae normal.  Cardiovascular:     Rate and Rhythm: Normal rate.  Pulmonary:     Effort: Pulmonary effort is normal.  Abdominal:     General: Bowel sounds are normal. There is no distension.     Palpations: Abdomen is soft. There is no mass.     Tenderness: There is abdominal tenderness (pelvic as well) in the right lower quadrant. There is no right CVA tenderness, left CVA tenderness, guarding or rebound.    Musculoskeletal:     Lumbar back: Tenderness present. No swelling, edema, deformity, signs of trauma, lacerations, spasms or bony tenderness. Normal range of motion. Negative right straight leg raise test and negative left straight leg raise test. No scoliosis.       Back:  Skin:    General: Skin is warm and dry.  Neurological:     General: No focal deficit present.     Mental Status: She is alert and oriented to person, place, and time.  Psychiatric:        Mood and Affect: Mood normal.        Behavior: Behavior normal.        Thought Content: Thought content normal.        Judgment: Judgment normal.     Results for orders placed or performed during the hospital encounter of 10/20/22 (from the past 24 hour(s))  POCT urinalysis dipstick     Status: Abnormal   Collection Time: 10/20/22 12:16 PM  Result Value Ref Range   Color, UA yellow yellow   Clarity, UA clear clear   Glucose, UA negative negative mg/dL   Bilirubin, UA negative negative   Ketones, POC UA negative negative mg/dL   Spec Grav, UA 5.409 8.119 - 1.025   Blood, UA trace-intact (A) negative   pH, UA 5.5 5.0 - 8.0   Protein Ur, POC negative negative mg/dL   Urobilinogen, UA 0.2 0.2 or 1.0 E.U./dL   Nitrite, UA Negative Negative   Leukocytes, UA Negative Negative   IM Toradol 60mg  administered  in clinic.   Assessment and Plan :   PDMP not reviewed this encounter.  1. Bacterial vaginosis   2. Dysuria   3. Right lower quadrant abdominal pain    Discussed possibility of acute abdomen, appendicitis, ovarian torsion, PID. Patient wants to avoid ER for now, agrees to maintain ER precautions. Can use naproxen for pain and inflammation. Otherwise, we will treat patient empirically for bacterial vaginosis with Metrogel. Labs  pending. Counseled patient on potential for adverse effects with medications prescribed/recommended today, ER and return-to-clinic precautions discussed, patient verbalized understanding.    Wallis Bamberg, PA-C 10/20/22 1239

## 2022-10-20 NOTE — Discharge Instructions (Addendum)
If your abdominal pain worsens, please go straight to the emergency room for consideration of a CT scan to make sure you do not have appendicitis, acute abdomen, ovarian torsion, PID.   Otherwise, start Metrogel for suspected bacterial vaginosis infection. We will let you know about your vaginal swab and urine culture results as they return. Otherwise, you can take naproxen for pain and inflammation.

## 2022-10-21 ENCOUNTER — Encounter (HOSPITAL_COMMUNITY): Payer: Self-pay

## 2022-10-21 ENCOUNTER — Emergency Department (HOSPITAL_COMMUNITY)
Admission: EM | Admit: 2022-10-21 | Discharge: 2022-10-21 | Disposition: A | Discharge status: Home / Self Care | Payer: Medicaid Other | Attending: Emergency Medicine | Admitting: Emergency Medicine

## 2022-10-21 ENCOUNTER — Other Ambulatory Visit: Payer: Self-pay

## 2022-10-21 ENCOUNTER — Emergency Department (HOSPITAL_COMMUNITY): Payer: Medicaid Other

## 2022-10-21 DIAGNOSIS — R1031 Right lower quadrant pain: Secondary | ICD-10-CM | POA: Diagnosis present

## 2022-10-21 DIAGNOSIS — R1084 Generalized abdominal pain: Secondary | ICD-10-CM | POA: Diagnosis not present

## 2022-10-21 LAB — CERVICOVAGINAL ANCILLARY ONLY
Bacterial Vaginitis (gardnerella): NEGATIVE
Candida Glabrata: NEGATIVE
Candida Vaginitis: NEGATIVE
Chlamydia: NEGATIVE
Comment: NEGATIVE
Comment: NEGATIVE
Comment: NEGATIVE
Comment: NEGATIVE
Comment: NEGATIVE
Comment: NORMAL
Neisseria Gonorrhea: NEGATIVE
Trichomonas: NEGATIVE

## 2022-10-21 LAB — COMPREHENSIVE METABOLIC PANEL
ALT: 16 U/L (ref 0–44)
AST: 19 U/L (ref 15–41)
Albumin: 4.1 g/dL (ref 3.5–5.0)
Alkaline Phosphatase: 45 U/L (ref 38–126)
Anion gap: 8 (ref 5–15)
BUN: 11 mg/dL (ref 6–20)
CO2: 26 mmol/L (ref 22–32)
Calcium: 9.1 mg/dL (ref 8.9–10.3)
Chloride: 102 mmol/L (ref 98–111)
Creatinine, Ser: 0.72 mg/dL (ref 0.44–1.00)
GFR, Estimated: >60 mL/min (ref >60)
Glucose, Bld: 88 mg/dL (ref 70–99)
Potassium: 3.5 mmol/L (ref 3.5–5.1)
Sodium: 136 mmol/L (ref 135–145)
Total Bilirubin: 0.4 mg/dL (ref 0.3–1.2)
Total Protein: 7.3 g/dL (ref 6.5–8.1)

## 2022-10-21 LAB — CBC WITH DIFFERENTIAL/PLATELET
Abs Immature Granulocytes: 0.01 10*3/uL (ref 0.00–0.07)
Basophils Absolute: 0 10*3/uL (ref 0.0–0.1)
Basophils Relative: 1 %
Eosinophils Absolute: 0.2 10*3/uL (ref 0.0–0.5)
Eosinophils Relative: 2 %
HCT: 44.8 % (ref 36.0–46.0)
Hemoglobin: 14.4 g/dL (ref 12.0–15.0)
Immature Granulocytes: 0 %
Lymphocytes Relative: 42 %
Lymphs Abs: 3.5 10*3/uL (ref 0.7–4.0)
MCH: 29.6 pg (ref 26.0–34.0)
MCHC: 32.1 g/dL (ref 30.0–36.0)
MCV: 92.2 fL (ref 80.0–100.0)
Monocytes Absolute: 0.5 10*3/uL (ref 0.1–1.0)
Monocytes Relative: 6 %
Neutro Abs: 4 10*3/uL (ref 1.7–7.7)
Neutrophils Relative %: 49 %
Platelets: 389 10*3/uL (ref 150–400)
RBC: 4.86 MIL/uL (ref 3.87–5.11)
RDW: 13.8 % (ref 11.5–15.5)
WBC: 8.2 10*3/uL (ref 4.0–10.5)
nRBC: 0 % (ref 0.0–0.2)

## 2022-10-21 LAB — URINALYSIS, ROUTINE W REFLEX MICROSCOPIC
Bilirubin Urine: NEGATIVE
Glucose, UA: NEGATIVE mg/dL
Hgb urine dipstick: NEGATIVE
Ketones, ur: 5 mg/dL — AB
Leukocytes,Ua: NEGATIVE
Nitrite: NEGATIVE
Protein, ur: NEGATIVE mg/dL
Specific Gravity, Urine: 1.03 (ref 1.005–1.030)
pH: 5 (ref 5.0–8.0)

## 2022-10-21 LAB — LIPASE, BLOOD: Lipase: 28 U/L (ref 11–51)

## 2022-10-21 LAB — HCG, SERUM, QUALITATIVE: Preg, Serum: NEGATIVE

## 2022-10-21 LAB — URINE CULTURE: Culture: NO GROWTH

## 2022-10-21 MED ORDER — CELECOXIB 200 MG PO CAPS: 200.0000 mg | ORAL_CAPSULE | Freq: Two times a day (BID) | ORAL | 0 refills | Status: DC

## 2022-10-21 MED ORDER — KETOROLAC TROMETHAMINE 15 MG/ML IJ SOLN
15.0000 mg | Freq: Once | INTRAMUSCULAR | Status: AC
  Administered 2022-10-21: 15 mg via INTRAVENOUS
  Filled 2022-10-21: qty 1

## 2022-10-21 MED ORDER — ONDANSETRON HCL 4 MG/2ML IJ SOLN
4.0000 mg | Freq: Once | INTRAMUSCULAR | Status: AC
Start: 2022-10-21 — End: 2022-10-21
  Administered 2022-10-21: 4 mg via INTRAVENOUS
  Filled 2022-10-21: qty 2

## 2022-10-21 MED ORDER — LACTATED RINGERS IV BOLUS
1000.0000 mL | Freq: Once | INTRAVENOUS | Status: AC
  Administered 2022-10-21: 1000 mL via INTRAVENOUS

## 2022-10-21 MED ORDER — MORPHINE SULFATE (PF) 4 MG/ML IV SOLN
6.0000 mg | Freq: Once | INTRAVENOUS | Status: AC
  Administered 2022-10-21: 6 mg via INTRAVENOUS
  Filled 2022-10-21: qty 2

## 2022-10-21 MED ORDER — IOHEXOL 300 MG/ML  SOLN
100.0000 mL | Freq: Once | INTRAMUSCULAR | Status: AC | PRN
  Administered 2022-10-21: 100 mL via INTRAVENOUS

## 2022-10-21 NOTE — ED Triage Notes (Signed)
RLQ abdominal pain / right groin pain x 2 weeks. Last bowel movement 3 days ago.  Initially presented to UC for same and was told everything was fine.   Denies urinary pain but verbalizes frequency and pressure.

## 2022-10-21 NOTE — ED Provider Notes (Signed)
[place] EMERGENCY DEPARTMENT AT [facility] Provider Note   CSN: [id number] Arrival date & time: 10/21/22  1944     History Chief Complaint  Patient presents with   Abdominal Pain    HPI Amy Church is a 37 y.o. female presenting for chief plaint of abdominal pain x 2 weeks.  She is a 29 atrial female minimal medical history.  Has a history of obesity has lost about 100 pounds in the last year.  She endorses about a week of progressive right lower quadrant abdominal pain.  Feels like her skin is burning and very sharp stabbing into her right lower quadrant.  Has tried conservative management with only symptomatic worsening.  Second ER visit this week for similar.  Denies fevers chills but has having severe nausea and anorexia today..   Patient's recorded medical, surgical, social, medication list and allergies were reviewed in the Snapshot window as part of the initial history.   Review of Systems   Review of Systems  Constitutional:  Negative for chills and fever.  HENT:  Negative for ear pain and sore throat.   Eyes:  Negative for pain and visual disturbance.  Respiratory:  Negative for cough and shortness of breath.   Cardiovascular:  Negative for chest pain and palpitations.  Gastrointestinal:  Positive for abdominal pain and nausea. Negative for vomiting.  Genitourinary:  Negative for dysuria and hematuria.  Musculoskeletal:  Negative for arthralgias and back pain.  Skin:  Negative for color change and rash.  Neurological:  Negative for seizures and syncope.  All other systems reviewed and are negative.   Physical Exam Updated Vital Signs BP (!) 144/85   Pulse 68   Temp 98.2 F (36.8 C)   Resp 18   Ht 5\' 6"  (1.676 m)   Wt 112 kg   LMP  (Within Months)   SpO2 100%   BMI 39.87 kg/m  Physical Exam Vitals and nursing note reviewed.  Constitutional:      General: She is not in acute distress.    Appearance: She is well-developed.  HENT:     Head:  Normocephalic and atraumatic.  Eyes:     Conjunctiva/sclera: Conjunctivae normal.  Cardiovascular:     Rate and Rhythm: Normal rate and regular rhythm.     Heart sounds: No murmur heard. Pulmonary:     Effort: Pulmonary effort is normal. No respiratory distress.     Breath sounds: Normal breath sounds.  Abdominal:     General: There is no distension.     Palpations: Abdomen is soft.     Tenderness: There is abdominal tenderness in the right lower quadrant, periumbilical area and suprapubic area. There is guarding. There is no right CVA tenderness or left CVA tenderness.  Musculoskeletal:        General: No swelling or tenderness. Normal range of motion.     Cervical back: Neck supple.  Skin:    General: Skin is warm and dry.  Neurological:     General: No focal deficit present.     Mental Status: She is alert and oriented to person, place, and time. Mental status is at baseline.     Cranial Nerves: No cranial nerve deficit.      ED Course/ Medical Decision Making/ A&P    Procedures Procedures   Medications Ordered in ED Medications  morphine (PF) 4 MG/ML injection 6 mg (6 mg Intravenous Given 10/21/22 2153)  ondansetron (ZOFRAN) injection 4 mg (4 mg Intravenous Given 10/21/22 2153)  lactated ringers bolus 1,000 mL (0 mLs Intravenous Stopped 10/21/22 2318)  iohexol (OMNIPAQUE) 300 MG/ML solution 100 mL (100 mLs Intravenous Contrast Given 10/21/22 2158)  ketorolac (TORADOL) 15 MG/ML injection 15 mg (15 mg Intravenous Given 10/21/22 2254)   Medical Decision Making:   Amy Church is a 37 y.o. female who presented to the ED today with abdominal pain, detailed above.    Patient placed on continuous vitals and telemetry monitoring while in ED which was reviewed periodically.  Complete initial physical exam performed, notably the patient  was hemodynamically stable in no acute distress.  Substantial right lower quadrant abdominal pain.     Reviewed and confirmed nursing  documentation for past medical history, family history, social history.    Initial Assessment:   With the patient's presentation of abdominal pain, most likely diagnosis is musculoskeletal etiology such as abdominal wall syndrome. Other diagnoses were considered including (but not limited to) gastroenteritis, colitis, small bowel obstruction, appendicitis, cholecystitis, pancreatitis, nephrolithiasis, UTI, pyleonephritis, ruptured ectopic pregnancy, PID, ovarian torsion. These are considered less likely due to history of present illness and physical exam findings.   This is most consistent with an acute life/limb threatening illness complicated by underlying chronic conditions.   Initial Plan:  CBC/CMP to evaluate for underlying infectious/metabolic etiology for patient's abdominal pain  Lipase to evaluate for pancreatitis  CTAB/Pelvis with contrast to evaluate for structural/surgical etiology of patients' severe abdominal pain.  Urinalysis and repeat physical assessment to evaluate for UTI/Pyelonpehritis  Empiric management of symptoms with escalating pain control and antiemetics as needed.   Initial Study Results:   Laboratory  All laboratory results reviewed without evidence of clinically relevant pathology.     Radiology All images reviewed independently. Agree with radiology report at this time.   CT ABDOMEN PELVIS W CONTRAST  Result Date: 10/21/2022 CLINICAL DATA:  Right lower quadrant and groin pain EXAM: CT ABDOMEN AND PELVIS WITH CONTRAST TECHNIQUE: Multidetector CT imaging of the abdomen and pelvis was performed using the standard protocol following bolus administration of intravenous contrast. RADIATION DOSE REDUCTION: This exam was performed according to the departmental dose-optimization program which includes automated exposure control, adjustment of the mA and/or kV according to patient size and/or use of iterative reconstruction technique. CONTRAST:  OMNIPAQUE IOHEXOL 300  MG/ML  SOLN COMPARISON:  CT 05/27/2021 FINDINGS: Lower chest: Lung bases demonstrate no acute airspace disease Hepatobiliary: No focal liver abnormality is seen. No gallstones, gallbladder wall thickening, or biliary dilatation. Pancreas: Unremarkable. No pancreatic ductal dilatation or surrounding inflammatory changes. Spleen: Normal in size without focal abnormality. Adrenals/Urinary Tract: Adrenal glands are unremarkable. Kidneys are normal, without renal calculi, focal lesion, or hydronephrosis. Bladder is unremarkable. Stomach/Bowel: Stomach is within normal limits. Appendix appears normal. No evidence of bowel wall thickening, distention, or inflammatory changes. Vascular/Lymphatic: No significant vascular findings are present. No enlarged abdominal or pelvic lymph nodes. Reproductive: Uterus unremarkable.  4.5 x 4 cm left adnexal cyst Other: No abdominal wall hernia or abnormality. No abdominopelvic ascites. Musculoskeletal: No acute or significant osseous findings. IMPRESSION: 1. Negative for acute appendicitis. No CT evidence for acute intra-abdominal or pelvic abnormality. 2. 4.5 cm left adnexal cyst. No follow-up imaging recommended. Note: This recommendation does not apply to premenarchal patients and to those with increased risk (genetic, family history, elevated tumor markers or other high-risk factors) of ovarian cancer. Reference: JACR 2020 Feb; 17(2):248-254 Electronically Signed   By: Jasmine Pang M.D.   On: 10/21/2022 22:34     Final Reassessment and Plan:  Adnexal cyst detected. No acute pathology detected.  Patient's had resolution of her pain. Reevaluated at bedside and patient is in no acute distress. Will refer to gynecology outpatient setting for further workup of the adnexal cyst.  She is otherwise acutely ambulatory tolerating p.o. intake in no acute distress.  Disposition:  I have considered need for hospitalization, however, considering all of the above, I believe this  patient is stable for discharge at this time.  Patient/family educated about specific return precautions for given chief complaint and symptoms.  Patient/family educated about follow-up with PCP.     Patient/family expressed understanding of return precautions and need for follow-up. Patient spoken to regarding all imaging and laboratory results and appropriate follow up for these results. All education provided in verbal form with additional information in written form. Time was allowed for answering of patient questions. Patient discharged.    Emergency Department Medication Summary:   Medications  morphine (PF) 4 MG/ML injection 6 mg (6 mg Intravenous Given 10/21/22 2153)  ondansetron (ZOFRAN) injection 4 mg (4 mg Intravenous Given 10/21/22 2153)  lactated ringers bolus 1,000 mL (0 mLs Intravenous Stopped 10/21/22 2318)  iohexol (OMNIPAQUE) 300 MG/ML solution 100 mL (100 mLs Intravenous Contrast Given 10/21/22 2158)  ketorolac (TORADOL) 15 MG/ML injection 15 mg (15 mg Intravenous Given 10/21/22 2254)    Clinical Impression:  1. Generalized abdominal pain      Discharge   Final Clinical Impression(s) / ED Diagnoses Final diagnoses:  Generalized abdominal pain    Rx / DC Orders ED Discharge Orders          Ordered    celecoxib (CELEBREX) 200 MG capsule  2 times daily        10/21/22 2243              Glyn Ade, MD 10/21/22 2324

## 2022-10-21 NOTE — Discharge Instructions (Addendum)
Your abdominal pain is nonspecific at this time. We have not identified any emergent pathology. I have suspicion for endometriosis based on your CT scan with ectopic uterine tissue potentially. Though this was nonspecific.  Your right ovary is also larger than anticipated per the radiology report. Your pain is atypical for this condition and is more consistent with possible abdominal wall syndrome based on your recent weight changes.

## 2022-10-23 ENCOUNTER — Encounter (HOSPITAL_COMMUNITY): Payer: Self-pay

## 2022-10-23 ENCOUNTER — Emergency Department (HOSPITAL_COMMUNITY)
Admission: EM | Admit: 2022-10-23 | Discharge: 2022-10-23 | Disposition: A | Discharge status: Home / Self Care | Payer: Medicaid Other | Attending: Emergency Medicine | Admitting: Emergency Medicine

## 2022-10-23 ENCOUNTER — Other Ambulatory Visit: Payer: Self-pay

## 2022-10-23 ENCOUNTER — Emergency Department (HOSPITAL_COMMUNITY): Payer: Medicaid Other

## 2022-10-23 DIAGNOSIS — Z794 Long term (current) use of insulin: Secondary | ICD-10-CM | POA: Insufficient documentation

## 2022-10-23 DIAGNOSIS — R1031 Right lower quadrant pain: Secondary | ICD-10-CM | POA: Insufficient documentation

## 2022-10-23 LAB — BASIC METABOLIC PANEL WITH GFR
Anion gap: 5 (ref 5–15)
BUN: 11 mg/dL (ref 6–20)
CO2: 25 mmol/L (ref 22–32)
Calcium: 8.2 mg/dL — ABNORMAL LOW (ref 8.9–10.3)
Chloride: 106 mmol/L (ref 98–111)
Creatinine, Ser: 0.78 mg/dL (ref 0.44–1.00)
GFR, Estimated: >60 mL/min
Glucose, Bld: 80 mg/dL (ref 70–99)
Potassium: 3.6 mmol/L (ref 3.5–5.1)
Sodium: 136 mmol/L (ref 135–145)

## 2022-10-23 LAB — CBC WITH DIFFERENTIAL/PLATELET
Abs Immature Granulocytes: 0.02 10*3/uL (ref 0.00–0.07)
Basophils Absolute: 0 10*3/uL (ref 0.0–0.1)
Basophils Relative: 0 %
Eosinophils Absolute: 0.2 10*3/uL (ref 0.0–0.5)
Eosinophils Relative: 3 %
HCT: 39.6 % (ref 36.0–46.0)
Hemoglobin: 13 g/dL (ref 12.0–15.0)
Immature Granulocytes: 0 %
Lymphocytes Relative: 38 %
Lymphs Abs: 2.7 10*3/uL (ref 0.7–4.0)
MCH: 30.1 pg (ref 26.0–34.0)
MCHC: 32.8 g/dL (ref 30.0–36.0)
MCV: 91.7 fL (ref 80.0–100.0)
Monocytes Absolute: 0.5 10*3/uL (ref 0.1–1.0)
Monocytes Relative: 7 %
Neutro Abs: 3.7 10*3/uL (ref 1.7–7.7)
Neutrophils Relative %: 52 %
Platelets: 364 10*3/uL (ref 150–400)
RBC: 4.32 MIL/uL (ref 3.87–5.11)
RDW: 13.7 % (ref 11.5–15.5)
WBC: 7.1 10*3/uL (ref 4.0–10.5)
nRBC: 0 % (ref 0.0–0.2)

## 2022-10-23 LAB — PREGNANCY, URINE: Preg Test, Ur: NEGATIVE

## 2022-10-23 MED ORDER — ONDANSETRON HCL 4 MG/2ML IJ SOLN
4.0000 mg | Freq: Once | INTRAMUSCULAR | Status: AC
  Administered 2022-10-23: 4 mg via INTRAVENOUS
  Filled 2022-10-23: qty 2

## 2022-10-23 MED ORDER — HYDROMORPHONE HCL 1 MG/ML IJ SOLN
1.0000 mg | Freq: Once | INTRAMUSCULAR | Status: AC
  Administered 2022-10-23: 1 mg via INTRAVENOUS
  Filled 2022-10-23: qty 1

## 2022-10-23 MED ORDER — HYDROCODONE-ACETAMINOPHEN 5-325 MG PO TABS
1.0000 | ORAL_TABLET | Freq: Four times a day (QID) | ORAL | 0 refills | Status: DC | PRN
Start: 2022-10-23 — End: 2022-12-27

## 2022-10-23 NOTE — Discharge Instructions (Signed)
Take the pain medication as prescribed.  [facility] contacted the [facility] information provided above.  They will call you on Monday for follow-up.  Also highly recommend you make an appointment or keep your appointment that you have with [facility].  Return for any new or worse symptoms.

## 2022-10-23 NOTE — ED Provider Notes (Addendum)
[place] EMERGENCY DEPARTMENT AT [facility] Provider Note   CSN: [id number] Arrival date & time: 10/23/22  1315     History  Chief Complaint  Patient presents with   Abdominal Pain    Amy Church is a 37 y.o. female.  Patient seen and department August 22 for right lower quadrant abdominal pain.  Patient returns today with persistent pain.  Pain goes from right lower quadrant towards the back and the kind of a bit of a burning sensation in the upper part of the leg.  No nausea no vomiting or diarrhea.  Workup on August 22 was very thorough.  Patient had CT scan abdomen pelvis without any acute findings.  Did show evidence of a left adnexal cyst that was considered physiologic.  No further follow-up needed for that.  Patient's CBC complete metabolic panel pregnancy test and urinalysis was negative.  And urine culture also had no growth.  They had recommended follow-up with GYN patient has not been able to arrange that yet.  She was discharged on Celebrex.  This is not helping at all.       Home Medications Prior to Admission medications   Medication Sig Start Date End Date Taking? Authorizing Provider  AMBULATORY NON FORMULARY MEDICATION Blood pressure cuff/Monitored regularly at home.  ICD 10: ZO09.90 08/07/18   Levie Heritage, DO  amLODipine (NORVASC) 5 MG tablet Take 1 tablet (5 mg total) by mouth daily. 03/07/20   Elson Areas, PA-C  celecoxib (CELEBREX) 200 MG capsule Take 1 capsule (200 mg total) by mouth 2 (two) times daily. 10/21/22   Glyn Ade, MD  diclofenac Sodium (VOLTAREN) 1 % GEL Apply 4 g topically 4 (four) times daily. Apply to affected areas 4 times daily as needed for pain. 08/01/21   Theadora Rama Scales, PA-C  famotidine (PEPCID) 20 MG tablet Take 1 tablet (20 mg total) by mouth 2 (two) times daily. 05/28/21   Vanetta Mulders, MD  ibuprofen (ADVIL) 600 MG tablet Take 1 tablet (600 mg total) by mouth every 8 (eight) hours as needed for up to  30 doses for fever, headache, mild pain or moderate pain (Inflammation). Take 1 tablet 3 times daily as needed for inflammation of upper airways and/or pain. 08/01/21   Theadora Rama Scales, PA-C  methylPREDNISolone (MEDROL DOSEPAK) 4 MG TBPK tablet Take 24 mg on day 1, 20 mg on day 2, 16 mg on day 3, 12 mg on day 4, 8 mg on day 5, 4 mg on day 6.  Take all tablets in each row at once, do not spread tablets out throughout the day. 10/29/21   Theadora Rama Scales, PA-C  metroNIDAZOLE (METROGEL) 0.75 % vaginal gel Place 1 Applicatorful vaginally daily. Insert one applicator vaginally once daily. 10/20/22   Wallis Bamberg, PA-C  naproxen (NAPROSYN) 500 MG tablet Take 1 tablet (500 mg total) by mouth 2 (two) times daily with a meal. 10/20/22   Wallis Bamberg, PA-C  tirzepatide [facility]) 10 MG/0.5ML Pen Inject 10 mg into the skin once a week.    [provider]      Allergies    Penicillins    Review of Systems   Review of Systems  Constitutional:  Negative for chills and fever.  HENT:  Negative for ear pain and sore throat.   Eyes:  Negative for pain and visual disturbance.  Respiratory:  Negative for cough and shortness of breath.   Cardiovascular:  Negative for chest pain and palpitations.  Gastrointestinal:  Positive for abdominal pain. Negative for diarrhea, nausea and vomiting.  Genitourinary:  Negative for dysuria and hematuria.  Musculoskeletal:  Negative for arthralgias and back pain.  Skin:  Negative for color change and rash.  Neurological:  Negative for seizures and syncope.  All other systems reviewed and are negative.   Physical Exam Updated Vital Signs BP (!) 137/107 (BP Location: Left Arm)   Pulse 82   Temp 98 F (36.7 C) (Oral)   Resp 19   Ht 1.676 m (5\' 6" )   Wt 112 kg   LMP  (Within Months)   SpO2 100%   BMI 39.87 kg/m  Physical Exam Vitals and nursing note reviewed.  Constitutional:      General: She is not in acute distress.    Appearance: Normal  appearance. She is well-developed. She is not ill-appearing.  HENT:     Head: Normocephalic and atraumatic.  Eyes:     Extraocular Movements: Extraocular movements intact.     Conjunctiva/sclera: Conjunctivae normal.     Pupils: Pupils are equal, round, and reactive to light.  Cardiovascular:     Rate and Rhythm: Normal rate and regular rhythm.     Heart sounds: No murmur heard. Pulmonary:     Effort: Pulmonary effort is normal. No respiratory distress.     Breath sounds: Normal breath sounds.  Abdominal:     Palpations: Abdomen is soft. There is no mass.     Tenderness: There is abdominal tenderness. There is no guarding.     Comments: Some mild tenderness to palpation right lower quadrant.  No skin erythema.  No palpable mass.  No guarding.  Musculoskeletal:        General: No swelling.     Cervical back: Neck supple.  Skin:    General: Skin is warm and dry.     Capillary Refill: Capillary refill takes less than 2 seconds.  Neurological:     General: No focal deficit present.     Mental Status: She is alert and oriented to person, place, and time.  Psychiatric:        Mood and Affect: Mood normal.     ED Results / Procedures / Treatments   Labs (all labs ordered are listed, but only abnormal results are displayed) Labs Reviewed  PREGNANCY, URINE  CBC WITH DIFFERENTIAL/PLATELET  BASIC METABOLIC PANEL    EKG None  Radiology CT ABDOMEN PELVIS W CONTRAST  Result Date: 10/21/2022 CLINICAL DATA:  Right lower quadrant and groin pain EXAM: CT ABDOMEN AND PELVIS WITH CONTRAST TECHNIQUE: Multidetector CT imaging of the abdomen and pelvis was performed using the standard protocol following bolus administration of intravenous contrast. RADIATION DOSE REDUCTION: This exam was performed according to the departmental dose-optimization program which includes automated exposure control, adjustment of the mA and/or kV according to patient size and/or use of iterative reconstruction  technique. CONTRAST:  OMNIPAQUE IOHEXOL 300 MG/ML  SOLN COMPARISON:  CT 05/27/2021 FINDINGS: Lower chest: Lung bases demonstrate no acute airspace disease Hepatobiliary: No focal liver abnormality is seen. No gallstones, gallbladder wall thickening, or biliary dilatation. Pancreas: Unremarkable. No pancreatic ductal dilatation or surrounding inflammatory changes. Spleen: Normal in size without focal abnormality. Adrenals/Urinary Tract: Adrenal glands are unremarkable. Kidneys are normal, without renal calculi, focal lesion, or hydronephrosis. Bladder is unremarkable. Stomach/Bowel: Stomach is within normal limits. Appendix appears normal. No evidence of bowel wall thickening, distention, or inflammatory changes. Vascular/Lymphatic: No significant vascular findings are present. No enlarged abdominal or pelvic lymph nodes. Reproductive:  Uterus unremarkable.  4.5 x 4 cm left adnexal cyst Other: No abdominal wall hernia or abnormality. No abdominopelvic ascites. Musculoskeletal: No acute or significant osseous findings. IMPRESSION: 1. Negative for acute appendicitis. No CT evidence for acute intra-abdominal or pelvic abnormality. 2. 4.5 cm left adnexal cyst. No follow-up imaging recommended. Note: This recommendation does not apply to premenarchal patients and to those with increased risk (genetic, family history, elevated tumor markers or other high-risk factors) of ovarian cancer. Reference: JACR 2020 Feb; 17(2):248-254 Electronically Signed   By: Jasmine Pang M.D.   On: 10/21/2022 22:34    Procedures Procedures    Medications Ordered in ED Medications  ondansetron (ZOFRAN) injection 4 mg (has no administration in time range)  HYDROmorphone (DILAUDID) injection 1 mg (has no administration in time range)    ED Course/ Medical Decision Making/ A&P                                 Medical Decision Making Amount and/or Complexity of Data Reviewed Labs: ordered. Radiology:  ordered.  Risk Prescription drug management.   Patient with persistent right lower quadrant abdominal pain that is a little bit atypical.  Had extensive and thorough workup done on August 22.  The patient is convinced something is getting missed.  I have reviewed her lab results from that day and the CT scan results from that day and there was no really acute findings other than left adnexal cyst which is probably not contributing to this at all.  In addition radiologist had no follow-up for the left adnexal cyst.  Based on this we will go ahead and get basic labs today we will go ahead and get nonpregnant pelvic ultrasound to rule out torsion on that side.  If negative we will go ahead and treat for pain and have her follow-up with OB/GYN.  Patient does have primary care with [facility].  CBC no leukocytosis hemoglobin 13.  Platelets 364.  Pregnancy test negative.  Basic metabolic panel without any significant abnormalities renal function normal electrolytes normal.  Ultrasound pelvic complete along with Doppler studies showed bilateral ovaries are enlarged by cysts measuring up to 4 point centimeters on the left and 3.3 cm on the right today's are benign and functional and reproductive age setting and no significant further follow-up for characterization required.  Arterial and venous Doppler flow is present to both ovaries.  However with the enlarged ovaries they said that it possibly could represent an intermittent or incomplete ovarian torsion situation.  Discussed with on-call OB/GYN Myna Hidalgo.  They will [facility] in [facility] and will call her on Monday for an appointment.  I will treat the patient with hydrocodone for the pain.  Patient is okay with this plan.  She will return for any new or worse symptoms.    Final Clinical Impression(s) / ED Diagnoses Final diagnoses:  Right lower quadrant abdominal pain    Rx / DC Orders ED Discharge  Orders     None         Vanetta Mulders, MD 10/23/22 1440    Vanetta Mulders, MD 10/23/22 [phone]

## 2022-10-23 NOTE — ED Triage Notes (Addendum)
Pt coming in for right lower abd pain that refers around to her back and down the leg. Pt has been seen for this recently states "the right ureter is larger than the left". Pt also states that she has not had a bowel movement in 6 days. Pt has been taking celecoxib, and naproxen for pain without relief.

## 2022-10-27 ENCOUNTER — Ambulatory Visit (INDEPENDENT_AMBULATORY_CARE_PROVIDER_SITE_OTHER): Payer: Medicaid Other | Admitting: Obstetrics & Gynecology

## 2022-10-27 ENCOUNTER — Encounter: Payer: Self-pay | Admitting: Obstetrics & Gynecology

## 2022-10-27 VITALS — BP 130/85 | HR 91 | Wt 246.4 lb

## 2022-10-27 DIAGNOSIS — R102 Pelvic and perineal pain unspecified side: Secondary | ICD-10-CM

## 2022-10-27 DIAGNOSIS — N926 Irregular menstruation, unspecified: Secondary | ICD-10-CM | POA: Diagnosis not present

## 2022-10-27 DIAGNOSIS — R350 Frequency of micturition: Secondary | ICD-10-CM | POA: Diagnosis not present

## 2022-10-27 LAB — POCT URINALYSIS DIP (DEVICE)
Bilirubin Urine: NEGATIVE
Glucose, UA: NEGATIVE mg/dL
Hgb urine dipstick: NEGATIVE
Ketones, ur: NEGATIVE mg/dL
Leukocytes,Ua: NEGATIVE
Nitrite: NEGATIVE
Protein, ur: NEGATIVE mg/dL
Specific Gravity, Urine: 1.025 (ref 1.005–1.030)
Urobilinogen, UA: 0.2 mg/dL (ref 0.0–1.0)
pH: 6.5 (ref 5.0–8.0)

## 2022-10-27 MED ORDER — MEDROXYPROGESTERONE ACETATE 5 MG PO TABS: 10.0000 mg | ORAL_TABLET | Freq: Every day | ORAL | 1 refills | Status: DC

## 2022-10-27 MED ORDER — GABAPENTIN 100 MG PO CAPS
100.0000 mg | ORAL_CAPSULE | Freq: Two times a day (BID) | ORAL | 2 refills | Status: DC
Start: 2022-10-27 — End: 2022-12-27

## 2022-10-27 NOTE — Progress Notes (Signed)
Patient ID: Amy Church, female   DOB: Nov 01, 1985, 37 y.o.   MRN: [id number]  Chief Complaint  Patient presents with   Follow-up    HPI Amy Church is a 37 y.o. female.  [id number]  No LMP recorded (within months). (Menstrual status: Irregular Periods). LMP was 2 mo ago, usually regular. She notes RLQ pain and tenderness, worse one week ago which radiated to her back and right thigh. Hydrocodone rx in ED helps some. Several month of pain, urinary frequency and urge, dysuria with negative UA for UTI. Notes dysmenorrhea. S/P PP BTL  HPI  Past Medical History:  Diagnosis Date   Chronic fungal otitis externa    Gestational diabetes    Hypertension    Did not have during previous pregnancy   Temporomandibular jaw dysfunction     Past Surgical History:  Procedure Laterality Date   TUBAL LIGATION Bilateral 02/25/2019   Procedure: POST PARTUM TUBAL LIGATION;  Surgeon: Levie Heritage, DO;  Location: [facility];  Service: Gynecology;  Laterality: Bilateral;   WISDOM TOOTH EXTRACTION      Family History  Problem Relation Age of Onset   Heart disease Paternal Grandmother    Hypertension Paternal Grandmother    Diabetes Paternal Grandmother    Cancer Mother        pelvic    Social History Social History   Tobacco Use   Smoking status: Never   Smokeless tobacco: Never  Vaping Use   Vaping status: Never Used  Substance Use Topics   Alcohol use: Not Currently   Drug use: Not Currently    Types: Marijuana    Comment: stopped with +UPT    Allergies  Allergen Reactions   Penicillins Hives, Shortness Of Breath and Swelling    Has patient had a PCN reaction causing immediate rash, facial/tongue/throat swelling, SOB or lightheadedness with hypotension: yes Has patient had a PCN reaction causing severe rash involving mucus membranes or skin necrosis: no Has patient had a PCN reaction that required hospitalization yes Has patient had a PCN reaction occurring within the last 10  years: yes If all of the above answers are "NO", then may proceed with Cephalosporin use.     Current Outpatient Medications  Medication Sig Dispense Refill   AMBULATORY NON FORMULARY MEDICATION Blood pressure cuff/Monitored regularly at home.  ICD 10: ZO09.90 1 Device 0   amLODipine (NORVASC) 5 MG tablet Take 1 tablet (5 mg total) by mouth daily. 30 tablet 1   celecoxib (CELEBREX) 200 MG capsule Take 1 capsule (200 mg total) by mouth 2 (two) times daily. 20 capsule 0   diclofenac Sodium (VOLTAREN) 1 % GEL Apply 4 g topically 4 (four) times daily. Apply to affected areas 4 times daily as needed for pain. 100 g 2   famotidine (PEPCID) 20 MG tablet Take 1 tablet (20 mg total) by mouth 2 (two) times daily. 30 tablet 0   HYDROcodone-acetaminophen (NORCO/VICODIN) 5-325 MG tablet Take 1 tablet by mouth every 6 (six) hours as needed for moderate pain. 14 tablet 0   ibuprofen (ADVIL) 600 MG tablet Take 1 tablet (600 mg total) by mouth every 8 (eight) hours as needed for up to 30 doses for fever, headache, mild pain or moderate pain (Inflammation). Take 1 tablet 3 times daily as needed for inflammation of upper airways and/or pain. 30 tablet 0   medroxyPROGESTERone (PROVERA) 5 MG tablet Take 2 tablets (10 mg total) by mouth daily. For 14 days 28 tablet 1   methylPREDNISolone (  MEDROL DOSEPAK) 4 MG TBPK tablet Take 24 mg on day 1, 20 mg on day 2, 16 mg on day 3, 12 mg on day 4, 8 mg on day 5, 4 mg on day 6.  Take all tablets in each row at once, do not spread tablets out throughout the day. 21 tablet 0   metroNIDAZOLE (METROGEL) 0.75 % vaginal gel Place 1 Applicatorful vaginally daily. Insert one applicator vaginally once daily. 70 g 0   naproxen (NAPROSYN) 500 MG tablet Take 1 tablet (500 mg total) by mouth 2 (two) times daily with a meal. 30 tablet 0   tirzepatide (MOUNJARO) 10 MG/0.5ML Pen Inject 10 mg into the skin once a week.     No current facility-administered medications for this visit.     Review of Systems Review of Systems  HENT: Negative.    Respiratory: Negative.    Cardiovascular: Negative.   Gastrointestinal:  Positive for abdominal pain.  Genitourinary:  Positive for dysuria, frequency, pelvic pain and urgency.    Blood pressure 130/85, pulse 91, weight 246 lb 6.4 oz (111.8 kg).  Physical Exam Physical Exam Vitals and nursing note reviewed. Exam conducted with a chaperone present.  Constitutional:      Appearance: She is obese. She is not ill-appearing.  HENT:     Head: Normocephalic and atraumatic.  Cardiovascular:     Rate and Rhythm: Normal rate.  Pulmonary:     Effort: Pulmonary effort is normal.  Abdominal:     General: Abdomen is flat.     Tenderness: There is abdominal tenderness (mild RLQ soft no rebound).  Musculoskeletal:        General: Normal range of motion.  Skin:    General: Skin is warm and dry.  Neurological:     Mental Status: She is alert.  Psychiatric:        Mood and Affect: Mood normal.        Behavior: Behavior normal.     Data Reviewed Narrative & Impression  CLINICAL DATA:  Right lower quadrant and groin pain   EXAM: CT ABDOMEN AND PELVIS WITH CONTRAST   TECHNIQUE: Multidetector CT imaging of the abdomen and pelvis was performed using the standard protocol following bolus administration of intravenous contrast.   RADIATION DOSE REDUCTION: This exam was performed according to the departmental dose-optimization program which includes automated exposure control, adjustment of the mA and/or kV according to patient size and/or use of iterative reconstruction technique.   CONTRAST:  OMNIPAQUE IOHEXOL 300 MG/ML  SOLN   COMPARISON:  CT 05/27/2021   FINDINGS: Lower chest: Lung bases demonstrate no acute airspace disease   Hepatobiliary: No focal liver abnormality is seen. No gallstones, gallbladder wall thickening, or biliary dilatation.   Pancreas: Unremarkable. No pancreatic ductal dilatation  or surrounding inflammatory changes.   Spleen: Normal in size without focal abnormality.   Adrenals/Urinary Tract: Adrenal glands are unremarkable. Kidneys are normal, without renal calculi, focal lesion, or hydronephrosis. Bladder is unremarkable.   Stomach/Bowel: Stomach is within normal limits. Appendix appears normal. No evidence of bowel wall thickening, distention, or inflammatory changes.   Vascular/Lymphatic: No significant vascular findings are present. No enlarged abdominal or pelvic lymph nodes.   Reproductive: Uterus unremarkable.  4.5 x 4 cm left adnexal cyst   Other: No abdominal wall hernia or abnormality. No abdominopelvic ascites.   Musculoskeletal: No acute or significant osseous findings.   IMPRESSION: 1. Negative for acute appendicitis. No CT evidence for acute intra-abdominal or pelvic abnormality. 2.  4.5 cm left adnexal cyst. No follow-up imaging recommended. Note: This recommendation does not apply to premenarchal patients and to those with increased risk (genetic, family history, elevated tumor markers or other high-risk factors) of ovarian cancer. Reference: JACR 2020 Feb; 17(2):248-254     Electronically Signed   By: Jasmine Pang M.D.   On: 10/21/2022 22:34     Narrative & Impression  CLINICAL DATA:  Persistent right lower quadrant abdominal pain, recent negative CT   EXAM: TRANSABDOMINAL AND TRANSVAGINAL ULTRASOUND OF PELVIS   DOPPLER ULTRASOUND OF OVARIES   TECHNIQUE: Both transabdominal and transvaginal ultrasound examinations of the pelvis were performed. Transabdominal technique was performed for global imaging of the pelvis including uterus, ovaries, adnexal regions, and pelvic cul-de-sac.   It was necessary to proceed with endovaginal exam following the transabdominal exam to visualize the uterus, endometrium, ovaries, and adnexa. Color and duplex Doppler ultrasound was utilized to evaluate blood flow to the ovaries.    COMPARISON:  CT abdomen pelvis, 10/21/2019   FINDINGS: Uterus   Measurements: 9.7 x 4.4 x 6.2 cm = volume: 141 mL. No fibroids or other mass visualized.   Endometrium   Thickness: 1.5 cm.  No focal abnormality visualized.   Right ovary   Measurements: 5.3 x 4.7 x 3.7 cm = volume: 32 mL. Right ovarian cyst measuring 3.4 x 2.9 x 3.4 cm.   Left ovary   Measurements: 5.8 x 4.5 x 4.9 cm = volume: 66 mL. Thinly septated left ovarian cyst measuring 4.6 x 2.9 x 3.6 cm. Small follicles.   Pulsed Doppler evaluation of both ovaries demonstrates normal low-resistance arterial and venous waveforms.   Other findings   No abnormal free fluid.   IMPRESSION: 1. The bilateral ovaries are enlarged by cysts, measuring up to 4.6 cm on the left and 3.4 cm on the right. These are benign and functional in the reproductive age setting and no specific further follow-up or characterization required. 2. Arterial and venous Doppler flow is present to both ovaries. Please note however that intermittent or incomplete ovarian torsion can not be strictly excluded by ultrasound in any enlarged ovary in the setting of acutely referable signs and symptoms.     Electronically Signed   By: Jearld Lesch M.D.   On: 10/23/2022 16:24      Assessment Pelvic pain with urinary symptoms Irregular menses, dysmenorrhea Possible interstitial cystitis  Plan Orders Placed This Encounter  Procedures   Ambulatory referral to Urogynecology    Referral Priority:   Routine    Referral Type:   Consultation    Referral Reason:   Specialty Services Required    Requested Specialty:   Urology    Number of Visits Requested:   1   Meds ordered this encounter  Medications   medroxyPROGESTERone (PROVERA) 5 MG tablet    Sig: Take 2 tablets (10 mg total) by mouth daily. For 14 days    Dispense:  28 tablet    Refill:  1   gabapentin (NEURONTIN) 100 MG capsule    Sig: Take 1 capsule (100 mg total) by mouth 2 (two)  times daily.    Dispense:  60 capsule    Refill:  2   RTC 4 weeks  Urogyn referral  She declines provera for cycle control today   Scheryl Darter 10/27/2022, 11:40 AM

## 2022-10-28 ENCOUNTER — Ambulatory Visit: Payer: Medicaid Other | Admitting: Obstetrics & Gynecology

## 2022-11-29 ENCOUNTER — Ambulatory Visit (INDEPENDENT_AMBULATORY_CARE_PROVIDER_SITE_OTHER): Payer: Medicaid Other | Admitting: Obstetrics & Gynecology

## 2022-11-29 ENCOUNTER — Encounter: Payer: Self-pay | Admitting: Obstetrics & Gynecology

## 2022-11-29 VITALS — BP 119/87 | HR 97 | Wt 239.1 lb

## 2022-11-29 DIAGNOSIS — R102 Pelvic and perineal pain: Secondary | ICD-10-CM | POA: Diagnosis not present

## 2022-11-29 DIAGNOSIS — N926 Irregular menstruation, unspecified: Secondary | ICD-10-CM | POA: Diagnosis not present

## 2022-11-29 DIAGNOSIS — Z1330 Encounter for screening examination for mental health and behavioral disorders, unspecified: Secondary | ICD-10-CM

## 2022-11-29 MED ORDER — MEDROXYPROGESTERONE ACETATE 5 MG PO TABS
10.0000 mg | ORAL_TABLET | Freq: Every day | ORAL | 1 refills | Status: DC
Start: 2022-11-29 — End: 2022-12-27

## 2022-11-29 NOTE — Progress Notes (Signed)
Patient ID: Amy Church, female   DOB: [birth year], 37 y.o.   MRN: [id number]  Chief Complaint  Patient presents with   Gynecologic Exam   Follow-up    HPI Amy Church is a 37 y.o. female.  [id number] No LMP recorded. (Menstrual status: Irregular Periods). She still has pelvic pain as before, worse on the left.  HPI  Past Medical History:  Diagnosis Date   Chronic fungal otitis externa    Gestational diabetes    Hypertension    Did not have during previous pregnancy   Temporomandibular jaw dysfunction     Past Surgical History:  Procedure Laterality Date   TUBAL LIGATION Bilateral 02/25/2019   Procedure: POST PARTUM TUBAL LIGATION;  Surgeon: Levie Heritage, DO;  Location: [facility];  Service: Gynecology;  Laterality: Bilateral;   WISDOM TOOTH EXTRACTION      Family History  Problem Relation Age of Onset   Heart disease Paternal Grandmother    Hypertension Paternal Grandmother    Diabetes Paternal Grandmother    Cancer Mother        pelvic    Social History Social History   Tobacco Use   Smoking status: Never   Smokeless tobacco: Never  Vaping Use   Vaping status: Never Used  Substance Use Topics   Alcohol use: Not Currently   Drug use: Not Currently    Types: Marijuana    Comment: stopped with +UPT    Allergies  Allergen Reactions   Penicillins Hives, Shortness Of Breath and Swelling    Has patient had a PCN reaction causing immediate rash, facial/tongue/throat swelling, SOB or lightheadedness with hypotension: yes Has patient had a PCN reaction causing severe rash involving mucus membranes or skin necrosis: no Has patient had a PCN reaction that required hospitalization yes Has patient had a PCN reaction occurring within the last 10 years: yes If all of the above answers are "NO", then may proceed with Cephalosporin use.     Current Outpatient Medications  Medication Sig Dispense Refill   AMBULATORY NON FORMULARY MEDICATION Blood pressure  cuff/Monitored regularly at home.  ICD 10: ZO09.90 1 Device 0   ibuprofen (ADVIL) 600 MG tablet Take 1 tablet (600 mg total) by mouth every 8 (eight) hours as needed for up to 30 doses for fever, headache, mild pain or moderate pain (Inflammation). Take 1 tablet 3 times daily as needed for inflammation of upper airways and/or pain. 30 tablet 0   naproxen (NAPROSYN) 500 MG tablet Take 1 tablet (500 mg total) by mouth 2 (two) times daily with a meal. 30 tablet 0   tirzepatide (MOUNJARO) 10 MG/0.5ML Pen Inject 10 mg into the skin once a week.     amLODipine (NORVASC) 5 MG tablet Take 1 tablet (5 mg total) by mouth daily. 30 tablet 1   celecoxib (CELEBREX) 200 MG capsule Take 1 capsule (200 mg total) by mouth 2 (two) times daily. (Patient not taking: Reported on 11/29/2022) 20 capsule 0   diclofenac Sodium (VOLTAREN) 1 % GEL Apply 4 g topically 4 (four) times daily. Apply to affected areas 4 times daily as needed for pain. 100 g 2   famotidine (PEPCID) 20 MG tablet Take 1 tablet (20 mg total) by mouth 2 (two) times daily. 30 tablet 0   gabapentin (NEURONTIN) 100 MG capsule Take 1 capsule (100 mg total) by mouth 2 (two) times daily. 60 capsule 2   HYDROcodone-acetaminophen (NORCO/VICODIN) 5-325 MG tablet Take 1 tablet by mouth every 6 (six) hours  as needed for moderate pain. 14 tablet 0   medroxyPROGESTERone (PROVERA) 5 MG tablet Take 2 tablets (10 mg total) by mouth daily. For 14 days 28 tablet 1   methylPREDNISolone (MEDROL DOSEPAK) 4 MG TBPK tablet Take 24 mg on day 1, 20 mg on day 2, 16 mg on day 3, 12 mg on day 4, 8 mg on day 5, 4 mg on day 6.  Take all tablets in each row at once, do not spread tablets out throughout the day. 21 tablet 0   metroNIDAZOLE (METROGEL) 0.75 % vaginal gel Place 1 Applicatorful vaginally daily. Insert one applicator vaginally once daily. 70 g 0   No current facility-administered medications for this visit.    Review of Systems Review of Systems  Constitutional: Negative.    Gastrointestinal:  Positive for abdominal pain and constipation.  Genitourinary:  Positive for frequency, menstrual problem and pelvic pain.       Urge and dysuria    Blood pressure 119/87, pulse 97, weight 239 lb 1.6 oz (108.5 kg).  Physical Exam Physical Exam Vitals and nursing note reviewed.  Constitutional:      Appearance: She is obese.  Skin:    General: Skin is warm and dry.  Neurological:     Mental Status: She is alert.  Psychiatric:        Mood and Affect: Mood normal.        Behavior: Behavior normal.     Data Reviewed Ultrasound report  Assessment Pelvic pain - Plan: US PELVIC COMPLETE WITH TRANSVAGINAL, medroxyPROGESTERone (PROVERA) 5 MG tablet  Irregular menstruation - Plan: medroxyPROGESTERone (PROVERA) 5 MG tablet   Plan Orders Placed This Encounter  Procedures   US PELVIC COMPLETE WITH TRANSVAGINAL    Standing Status:   Future    Standing Expiration Date:   11/29/2023    Order Specific Question:   Reason for Exam (SYMPTOM  OR DIAGNOSIS REQUIRED)    Answer:   f/u ovarian cyst    Order Specific Question:   Preferred imaging location?    Answer:   [facility]   She will try provera for cycle control and urogyn appointment was requested. She will see PCP for possible GI referral    Amy Church 11/29/2022, 11:19 AM

## 2022-11-29 NOTE — Patient Instructions (Signed)
Uro/GYN Number: [id number]

## 2022-12-07 ENCOUNTER — Ambulatory Visit (HOSPITAL_COMMUNITY)
Admission: RE | Admit: 2022-12-07 | Discharge: 2022-12-07 | Disposition: A | Discharge status: Home / Self Care | Payer: Medicaid Other | Source: Ambulatory Visit | Attending: Obstetrics & Gynecology | Admitting: Obstetrics & Gynecology

## 2022-12-07 DIAGNOSIS — R102 Pelvic and perineal pain: Secondary | ICD-10-CM | POA: Diagnosis present

## 2022-12-27 ENCOUNTER — Ambulatory Visit (INDEPENDENT_AMBULATORY_CARE_PROVIDER_SITE_OTHER): Payer: Medicaid Other | Admitting: Obstetrics and Gynecology

## 2022-12-27 ENCOUNTER — Encounter: Payer: Self-pay | Admitting: Obstetrics and Gynecology

## 2022-12-27 VITALS — BP 110/76 | HR 82 | Ht 67.0 in | Wt 233.0 lb

## 2022-12-27 DIAGNOSIS — N941 Unspecified dyspareunia: Secondary | ICD-10-CM | POA: Diagnosis not present

## 2022-12-27 DIAGNOSIS — N3281 Overactive bladder: Secondary | ICD-10-CM

## 2022-12-27 DIAGNOSIS — M62838 Other muscle spasm: Secondary | ICD-10-CM | POA: Diagnosis not present

## 2022-12-27 DIAGNOSIS — N393 Stress incontinence (female) (male): Secondary | ICD-10-CM

## 2022-12-27 DIAGNOSIS — R350 Frequency of micturition: Secondary | ICD-10-CM

## 2022-12-27 LAB — POCT URINALYSIS DIPSTICK
Bilirubin, UA: NEGATIVE
Blood, UA: NEGATIVE
Glucose, UA: NEGATIVE
Ketones, UA: NEGATIVE
Leukocytes, UA: NEGATIVE
Nitrite, UA: NEGATIVE
Protein, UA: POSITIVE — AB
Spec Grav, UA: 1.02 (ref 1.010–1.025)
Urobilinogen, UA: 0.2 U/dL
pH, UA: 6.5 (ref 5.0–8.0)

## 2022-12-27 MED ORDER — TROSPIUM CHLORIDE ER 60 MG PO CP24
60.0000 mg | ORAL_CAPSULE | Freq: Every day | ORAL | 5 refills | Status: AC
Start: 2022-12-27 — End: ?

## 2022-12-27 MED ORDER — CYCLOBENZAPRINE HCL 5 MG PO TABS
5.0000 mg | ORAL_TABLET | Freq: Three times a day (TID) | ORAL | 1 refills | Status: DC | PRN
Start: 2022-12-27 — End: 2023-02-25

## 2022-12-27 NOTE — Patient Instructions (Addendum)
Goals: Urinate 5-6 times during the day and 1-2 times at night  Start the flexeril after dinner for your muscle spasms in the pelvic floor  Start pelvic floor PT  Today we talked about ways to manage bladder urgency such as altering your diet to avoid irritative beverages and foods (bladder diet) as well as attempting to decrease stress and other exacerbating factors.   There is a website with helpful information for people with bladder irritation, called the IC Network at https://www.ic-network.com. This website has more information about a healthy bladder diet and patient forums for support.  The Most Bothersome Foods* The Least Bothersome Foods*  Coffee - Regular & Decaf Tea - caffeinated Carbonated beverages - cola, non-colas, diet & caffeine-free Alcohols - Beer, Red Wine, White Wine, [address] - Grapefruit, [place], Orange, [employer] - Cranberry, Grapefruit, Orange, Pineapple Vegetables - Tomato & Tomato Products Flavor Enhancers - Hot peppers, Spicy foods, Chili, Horseradish, Vinegar, Monosodium glutamate (MSG) Artificial Sweeteners - NutraSweet, Sweet 'N Low, Equal (sweetener), Saccharin Ethnic foods - [place], [place], [place] food [employer] - low-fat & whole Fruits - Bananas, Blueberries, Honeydew melon, Pears, Raisins, Watermelon Vegetables - Broccoli, [address] Sprouts, [place], Carrots, Cauliflower, [place], Cucumber, Mushrooms, Peas, Radishes, Squash, Zucchini, White potatoes, Sweet potatoes & yams Poultry - Chicken, Eggs, [place], [employer] - Beef, Diplomatic Services operational officer, Lamb Seafood - Shrimp, [place] fish, Salmon Grains - Oat, Rice Snacks - Pretzels, Popcorn  *Lenward Chancellor et al. Diet and its role in interstitial cystitis/bladder pain syndrome (IC/BPS) and comorbid conditions. [employer]. [employer]. 2012 Jan 11.    Consider urethral bulking for Stress urinary incontinence.
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
## 2023-02-25 ENCOUNTER — Encounter: Payer: Self-pay | Admitting: Obstetrics and Gynecology

## 2023-02-25 ENCOUNTER — Ambulatory Visit (INDEPENDENT_AMBULATORY_CARE_PROVIDER_SITE_OTHER): Payer: Medicaid Other | Admitting: Obstetrics and Gynecology

## 2023-02-25 VITALS — BP 126/88 | HR 102

## 2023-02-25 DIAGNOSIS — M62838 Other muscle spasm: Secondary | ICD-10-CM | POA: Diagnosis not present

## 2023-02-25 DIAGNOSIS — N941 Unspecified dyspareunia: Secondary | ICD-10-CM | POA: Diagnosis not present

## 2023-02-25 DIAGNOSIS — N3281 Overactive bladder: Secondary | ICD-10-CM | POA: Diagnosis not present

## 2023-02-25 DIAGNOSIS — R350 Frequency of micturition: Secondary | ICD-10-CM

## 2023-02-25 DIAGNOSIS — N393 Stress incontinence (female) (male): Secondary | ICD-10-CM

## 2023-02-25 LAB — POCT URINALYSIS DIPSTICK
Bilirubin, UA: NEGATIVE
Blood, UA: NEGATIVE
Glucose, UA: NEGATIVE
Ketones, UA: NEGATIVE
Leukocytes, UA: NEGATIVE
Nitrite, UA: NEGATIVE
Protein, UA: NEGATIVE
Spec Grav, UA: 1.02 (ref 1.010–1.025)
Urobilinogen, UA: 0.2 U/dL
pH, UA: 7 (ref 5.0–8.0)

## 2023-02-25 MED ORDER — CYCLOBENZAPRINE HCL 5 MG PO TABS
5.0000 mg | ORAL_TABLET | Freq: Three times a day (TID) | ORAL | 1 refills | Status: DC | PRN
Start: 2023-02-25 — End: 2023-09-22

## 2023-02-25 NOTE — Progress Notes (Signed)
[facility] Return Visit  SUBJECTIVE  History of Present Illness: Amy Church is a 37 y.o. female seen in follow-up for OAB, levator spasm, and SUI. Plan at last visit was consider options for SUI and proceed with Urethral bulking in January if she wants, start pelvic floor PT, and start Trospium 60mg  ER daily for OAB.    Patient reports that she was recently sexually active and that it was not uncomfortable at this time.  Patient reports that she has been doing the Flexeril as needed which has been very helpful for her pelvic floor pain in her right lower quadrant pain.  We discussed that the PT will also be helpful for this and she is scheduled to start that in March.  Patient reports she has not been leaking urine when she previously was and she reports things have been better there.  She states she did not start the trospium 60 mg ER daily because her insurance did not cover it.  However she states she has been doing well without the medication.   Past Medical History: Patient  has a past medical history of Chronic fungal otitis externa, Gestational diabetes, Hypertension, and Temporomandibular jaw dysfunction.   Past Surgical History: She  has a past surgical history that includes Wisdom tooth extraction and Tubal ligation (Bilateral, 02/25/2019).   Medications: She has a current medication list which includes the following prescription(s): cyclobenzaprine, hydrochlorothiazide, ibuprofen, linzess, naproxen, pantoprazole, mounjaro, trospium chloride, and ventolin hfa.   Allergies: Patient is allergic to penicillins.   Social History: Patient  reports that she has never smoked. She has never used smokeless tobacco. She reports that she does not currently use alcohol. She reports that she does not currently use drugs after having used the following drugs: Marijuana.     OBJECTIVE     Physical Exam: Vitals:   02/25/23 1026  BP: 126/88  Pulse: (!) 102   Gen: No  apparent distress, A&O x 3.  Detailed Urogynecologic Evaluation:  Deferred.    ASSESSMENT AND PLAN    Amy Church is a 37 y.o. with:  1. Levator spasm   2. Dyspareunia in female   3. OAB (overactive bladder)   4. SUI (stress urinary incontinence, female)   5. Urinary frequency     Refills provided for patient for the Flexeril as it is low-dose and she is doing well with this.  She is still planning to do PT to see if that will help some of her pelvic floor pain and she is a self starter and a go-getter and I feel like once she has the tools and resources to know the stretching and the type of movement will be helpful for her pelvic floor pain she will do well. Patient reports she was able to have intercourse last night without pain which is a step in the right direction.  I think this will also improve with some pelvic floor physical therapy.  Patient reports she is also considering starting yoga to assist in her stretching and muscle movement and pelvic floor movement. Patient endorsed that with the improvement her pelvic floor pain she has not has much overactive bladder.  She is not currently on any medication and will keep it that way for now.  She will inform me if things start to get worse. Patient reports that she has not been having leaking with cough or sneeze recently and she does not plan to go forward with urethral bulking at this time.  She reports  she will let me know if anything changes on that front. For patient's urinary frequency she has been doing well with drinking her water and this has not been as bothersome.  Pelvic floor PT may help her with her urgency and frequency as well. Patient to return in 6 months or sooner if needed and she will inform me if she starts having more frequency urgency or stress urinary leakage.   Selmer Dominion, NP

## 2023-02-25 NOTE — Patient Instructions (Addendum)
Let's continue the Felxeril and then start PT.   We will cancel the bulking and follow up in 6 months

## 2023-03-10 ENCOUNTER — Ambulatory Visit: Payer: Medicaid Other | Admitting: Obstetrics

## 2023-03-30 ENCOUNTER — Encounter: Payer: Self-pay | Admitting: Gastroenterology

## 2023-04-07 ENCOUNTER — Other Ambulatory Visit: Payer: Self-pay

## 2023-04-07 ENCOUNTER — Encounter: Payer: Self-pay | Admitting: Physical Therapy

## 2023-04-07 ENCOUNTER — Encounter: Payer: Medicaid Other | Attending: Obstetrics and Gynecology | Admitting: Physical Therapy

## 2023-04-07 DIAGNOSIS — R252 Cramp and spasm: Secondary | ICD-10-CM | POA: Insufficient documentation

## 2023-04-07 DIAGNOSIS — R102 Pelvic and perineal pain: Secondary | ICD-10-CM | POA: Insufficient documentation
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
## 2023-04-26 ENCOUNTER — Ambulatory Visit: Payer: Medicaid Other | Admitting: Gastroenterology

## 2023-04-26 ENCOUNTER — Encounter: Payer: Self-pay | Admitting: Gastroenterology

## 2023-04-26 VITALS — BP 124/82 | HR 83 | Ht 67.0 in | Wt 220.5 lb

## 2023-04-26 DIAGNOSIS — M25552 Pain in left hip: Secondary | ICD-10-CM

## 2023-04-26 DIAGNOSIS — K5900 Constipation, unspecified: Secondary | ICD-10-CM

## 2023-04-26 DIAGNOSIS — K5909 Other constipation: Secondary | ICD-10-CM

## 2023-04-26 DIAGNOSIS — K219 Gastro-esophageal reflux disease without esophagitis: Secondary | ICD-10-CM

## 2023-04-26 DIAGNOSIS — R1032 Left lower quadrant pain: Secondary | ICD-10-CM

## 2023-04-26 MED ORDER — SUFLAVE 178.7 G PO SOLR
1.0000 | Freq: Once | ORAL | 0 refills | Status: AC
Start: 2023-04-26 — End: 2023-04-26

## 2023-04-26 MED ORDER — IBGARD 90 MG PO CPCR: ORAL_CAPSULE | ORAL | Status: AC

## 2023-04-26 NOTE — Progress Notes (Addendum)
 Chief Complaint: Constipation and reflux Primary GI MD: Gentry Fitz  HPI: Discussed the use of AI scribe software for clinical note transcription with the patient, who gave verbal consent to proceed.  Amy Church is a 38 year old female with chronic constipation and reflux who presents with abdominal pain and constipation management. She was referred by her primary care provider for management of constipation and abdominal pain.  She experiences chronic constipation and left-sided abdominal pain, which worsens with movement and when getting out of bed. The pain improves after bowel movements. She is on Linzess, which has helped with constipation, resulting in regular bowel movements (she is unsure which dose). She has a long-standing history of constipation since childhood and takes ibuprofen once daily for general aches and pains. She has not had a colonoscopy or endoscopy before. She requests a colonoscopy  She experiences reflux despite taking pantoprazole twice daily. However, her reflux symptoms have improved recently without dietary changes. No rectal bleeding.  She reports significant weight loss of approximately 120 pounds over the last two years, which was intentional through diet, exercise, and the use of Mounjaro.  She has a known 4.5 cm left adnexal cyst which she thinks may contribute to some of her pain.  She is monitored with her urogyn for this and just recently got set up for pelvic floor physical therapy  She has a history of hemorrhoids, which developed after her last pregnancy. She reports feeling a small ball during self-examination.    PREVIOUS GI WORKUP   CT abdomen pelvis with contrast 10/21/2022 - 4.5 cm left adnexal cyst.  Normal liver, gallbladder, pancreas, stomach, bowel  Past Medical History:  Diagnosis Date   Asthma    BMI 40.0-44.9, adult (HCC) 10/23/2018   Chronic constipation    Chronic fungal otitis externa    Chronic hypertension 08/07/2018    Amlodipine 5mg  daily     Diabetes mellitus (HCC)    GERD (gastroesophageal reflux disease)    Gestational diabetes    Hypertension    Did not have during previous pregnancy   Kidney stone    Temporomandibular jaw dysfunction     Past Surgical History:  Procedure Laterality Date   TUBAL LIGATION Bilateral 02/25/2019   Procedure: POST PARTUM TUBAL LIGATION;  Surgeon: Levie Heritage, DO;  Location: [facility];  Service: Gynecology;  Laterality: Bilateral;   WISDOM TOOTH EXTRACTION      Current Outpatient Medications  Medication Sig Dispense Refill   ibuprofen (ADVIL) 600 MG tablet Take 1 tablet (600 mg total) by mouth every 8 (eight) hours as needed for up to 30 doses for fever, headache, mild pain or moderate pain (Inflammation). Take 1 tablet 3 times daily as needed for inflammation of upper airways and/or pain. 30 tablet 0   LINZESS 145 MCG CAPS capsule Take 145 mcg by mouth every morning.     Peppermint Oil (IBGARD) 90 MG CPCR Take as directed as needed.     SUFLAVE 178.7 g SOLR Take 1 kit by mouth once for 1 dose. 1 each 0   tirzepatide (MOUNJARO) 10 MG/0.5ML Pen Inject 10 mg into the skin once a week.     cyclobenzaprine (FLEXERIL) 5 MG tablet Take 1 tablet (5 mg total) by mouth 3 (three) times daily as needed for muscle spasms. 60 tablet 1   hydrochlorothiazide (MICROZIDE) 12.5 MG capsule Take by mouth daily.     naproxen (NAPROSYN) 500 MG tablet Take 1 tablet (500 mg total) by mouth 2 (two)  times daily with a meal. 30 tablet 0   pantoprazole (PROTONIX) 40 MG tablet Take 40 mg by mouth 2 (two) times daily.     Trospium Chloride 60 MG CP24 Take 1 capsule (60 mg total) by mouth daily at 6 (six) AM. 30 capsule 5   VENTOLIN HFA 108 (90 Base) MCG/ACT inhaler SMARTSIG:2 Puff(s) By Mouth 4 Times Daily PRN     No current facility-administered medications for this visit.    Allergies as of 04/26/2023 - Review Complete 04/26/2023  Allergen Reaction Noted   Penicillins Hives, Shortness  Of Breath, and Swelling 12/15/2011    Family History  Problem Relation Age of Onset   Cancer Mother        pelvic   Heart disease Paternal Grandmother    Hypertension Paternal Grandmother    Diabetes Paternal Grandmother    Bladder Cancer Neg Hx    Uterine cancer Neg Hx     Social History   Socioeconomic History   Marital status: Single    Spouse name: Not on file   Number of children: Not on file   Years of education: Not on file   Highest education level: Not on file  Occupational History    Comment: unemployed  Tobacco Use   Smoking status: Former    Current packs/day: 0.25    Types: Cigarettes   Smokeless tobacco: Never  Vaping Use   Vaping status: Never Used  Substance and Sexual Activity   Alcohol use: Not Currently   Drug use: Not Currently    Types: Marijuana    Comment: stopped with +UPT   Sexual activity: Not Currently    Birth control/protection: None, Abstinence    Comment: 2 1/2 years  Other Topics Concern   Not on file  Social History Narrative   Not on file   Social Drivers of Health   Financial Resource Strain: Not on file  Food Insecurity: No Food Insecurity (11/29/2022)   Hunger Vital Sign    Worried About Running Out of Food in the Last Year: Never true    Ran Out of Food in the Last Year: Never true  Transportation Needs: No Transportation Needs (11/29/2022)   PRAPARE - Administrator, Civil Service (Medical): No    Lack of Transportation (Non-Medical): No  Physical Activity: Not on file  Stress: Not on file  Social Connections: Unknown (07/14/2021)   Received from [facility], [facility]   Social Network    Social Network: Not on file  Intimate Partner Violence: Unknown (06/05/2021)   Received from [facility], [facility]   HITS    Physically Hurt: Not on file    Insult or Talk Down To: Not on file    Threaten Physical Harm: Not on file    Scream or Curse: Not on file    Review of Systems:    Constitutional:  No weight loss, fever, chills, weakness or fatigue HEENT: Eyes: No change in vision               Ears, Nose, Throat:  No change in hearing or congestion Skin: No rash or itching Cardiovascular: No chest pain, chest pressure or palpitations   Respiratory: No SOB or cough Gastrointestinal: See HPI and otherwise negative Genitourinary: No dysuria or change in urinary frequency Neurological: No headache, dizziness or syncope Musculoskeletal: No new muscle or joint pain Hematologic: No bleeding or bruising Psychiatric: No history of depression or anxiety    Physical Exam:  Vital  signs: BP 124/82   Pulse 83   Ht 5\' 7"  (1.702 m)   Wt 220 lb 8 oz (100 kg)   BMI 34.54 kg/m   Constitutional: NAD, Well developed, Well nourished, alert and cooperative Head:  Normocephalic and atraumatic. Eyes:   PEERL, EOMI. No icterus. Conjunctiva pink. Respiratory: Respirations even and unlabored. Lungs clear to auscultation bilaterally.   No wheezes, crackles, or rhonchi.  Cardiovascular:  Regular rate and rhythm. No peripheral edema, cyanosis or pallor.  Gastrointestinal:  Soft, nondistended, nontender. No rebound or guarding. Normal bowel sounds. No appreciable masses or hepatomegaly. Rectal:  Not performed.  Msk:  Symmetrical without gross deformities. Without edema, no deformity or joint abnormality.  Neurologic:  Alert and  oriented x4;  grossly normal neurologically.  Skin:   Dry and intact without significant lesions or rashes. Psychiatric: Oriented to person, place and time. Demonstrates good judgement and reason without abnormal affect or behaviors.   RELEVANT LABS AND IMAGING: CBC    Component Value Date/Time   WBC 7.1 10/23/2022 1435   RBC 4.32 10/23/2022 1435   HGB 13.0 10/23/2022 1435   HGB 12.1 02/21/2019 1507   HCT 39.6 10/23/2022 1435   HCT 34.5 02/21/2019 1507   PLT 364 10/23/2022 1435   PLT 369 02/21/2019 1507   MCV 91.7 10/23/2022 1435   MCV 89 02/21/2019 1507   MCH 30.1  10/23/2022 1435   MCHC 32.8 10/23/2022 1435   RDW 13.7 10/23/2022 1435   RDW 13.2 02/21/2019 1507   LYMPHSABS 2.7 10/23/2022 1435   LYMPHSABS 2.8 08/21/2018 1505   MONOABS 0.5 10/23/2022 1435   EOSABS 0.2 10/23/2022 1435   EOSABS 0.2 08/21/2018 1505   BASOSABS 0.0 10/23/2022 1435   BASOSABS 0.0 08/21/2018 1505    CMP     Component Value Date/Time   NA 136 10/23/2022 1435   NA 137 02/21/2019 1507   K 3.6 10/23/2022 1435   CL 106 10/23/2022 1435   CO2 25 10/23/2022 1435   GLUCOSE 80 10/23/2022 1435   BUN 11 10/23/2022 1435   BUN 6 02/21/2019 1507   CREATININE 0.78 10/23/2022 1435   CREATININE 0.73 03/19/2020 1306   CALCIUM 8.2 (L) 10/23/2022 1435   PROT 7.3 10/21/2022 1955   PROT 5.7 (L) 02/21/2019 1507   ALBUMIN 4.1 10/21/2022 1955   ALBUMIN 3.2 (L) 02/21/2019 1507   AST 19 10/21/2022 1955   ALT 16 10/21/2022 1955   ALKPHOS 45 10/21/2022 1955   BILITOT 0.4 10/21/2022 1955   BILITOT <0.2 02/21/2019 1507   GFRNONAA >60 10/23/2022 1435   GFRNONAA 107 03/19/2020 1306   GFRAA 124 03/19/2020 1306     Assessment/Plan:     GERD Longstanding history of GERD initially controlled with PPI twice daily then had a breakthrough symptoms.  Now improved through dietary changes.  NSAID use daily with ibuprofen.  No previous history of endoscopy.  DDx includes esophagitis, gastritis, PUD. - Schedule EGD for further evaluation - I thoroughly discussed the procedure with the patient (at bedside) to include nature of the procedure, alternatives, benefits, and risks (including but not limited to bleeding, infection, perforation, anesthesia/cardiac pulmonary complications).  Patient verbalized understanding and gave verbal consent to proceed with procedure. - Continue PPI twice daily.  Due to her history of constipation Carafate is not advisable to add on.  She is okay with dietary changes at this time.  If necessary and breakthrough symptoms become more bothersome we can add on famotidine 40  Mg  Chronic Constipation  Improved with Linzess (dose unknown). Reports daily bowel movements. -Continue Linzess.    Left-sided Abdominal/hip Pain   Pain is worse with movement specifically in the morning when she is going from lying to standing position and sometimes improves with bowel movements.  Likely multifactorial DDx including musculoskeletal pain, chronic constipation, and a known 4.5 cm left adnexal cyst.  Due to longstanding history of LLQ pain and constipation patient would like a colonoscopy for further evaluation.  I think this is reasonable. --Consider increasing Linzess dose if not already on the highest dose.   --Provide samples of IB guard (antispasmodic) to help with left-sided pain. - Colonoscopy for further evaluation  Weight Loss   Significant intentional weight loss over the past two years (~120 lbs) with diet, exercise, and the use of Mounjaro.   -Continue current weight loss regimen.   -Be aware that Mounjaro may exacerbate constipation.    Pelvic Floor Physical Therapy   Recently started, may help with constipation.   -Continue pelvic floor physical therapy.    Follow-up   After scheduled colonoscopy and endoscopy.      Lara Mulch [facility] 04/26/2023, 12:11 PM  Cc: Fleet Contras, MD  I have reviewed the clinic note as outlined by Boone Master, PA and agree with the assessment, plan and medical decision making. Amy Church is referred for evaluation of abdominal pain, chronic constipation, left sided abdominal pain.  She has had lifelong constipation currently being managed with Linzess with some benefit.  She has developed new left-sided abdominal pain that worries her particularly in the setting of constipation.  Agree that colonoscopy to rule out pathology is reasonable.  She is also having GERD symptoms despite dose optimize PPI.  Will proceed with upper endoscopy at the same time as colonoscopy to evaluate for hiatal  hernia, rule out H. pylori, Barrett's esophagus.  Maren Beach, MD

## 2023-04-26 NOTE — Patient Instructions (Signed)
 We have given you samples of the following medication to take: IBgard take as directed.  You have been scheduled for an endoscopy and colonoscopy. Please follow the written instructions given to you at your visit today.  If you use inhalers (even only as needed), please bring them with you on the day of your procedure.  DO NOT TAKE 7 DAYS PRIOR TO TEST- Trulicity (dulaglutide) Ozempic, Wegovy (semaglutide) Mounjaro (tirzepatide) Bydureon Bcise (exanatide extended release)  DO NOT TAKE 1 DAY PRIOR TO YOUR TEST Rybelsus (semaglutide) Adlyxin (lixisenatide) Victoza (liraglutide) Byetta (exanatide)  Thank you for trusting me with your gastrointestinal care!   Boone Master, PA  _______________________________________________________________________  Bonita Quin will receive your bowel preparation through [facility], which ensures the lowest copay and home delivery, with outreach via text or call from an [phone] number. Please respond promptly to avoid rescheduling of your procedure. If you are interested in alternative options or have any questions regarding your prep, please contact them at [phone] _______________________________________________________________________  Your Provider Has Sent Your Bowel Prep Regimen To [facility]   [facility] will contact you to verify your information and collect your copay, if applicable. Enjoy the comfort of your home while your prescription is mailed to you, FREE of any shipping charges.   [facility] accepts all major insurance benefits and applies discounts & coupons.  Have additional questions?   Chat: [email or web address] Call: [phone] Email: [email or web address] NCPDP: [id number]  How will [facility] contact you?  With a Welcome phone call,  a Welcome text and a checkout link in text form.  Texts you receive from [phone] Are NOT Spam.  *To set up delivery, you must complete the checkout process via link or speak to  one of the patient care representatives. If [facility] is unable to reach you, your prescription may be delayed.  To avoid long hold times on the phone, you may also utilize the secure chat feature on the [facility] website to request that they call you back for transaction completion or to expedite your concerns.   _______________________________________________________  If your blood pressure at your visit was 140/90 or greater, please contact your primary care physician to follow up on this.  _______________________________________________________  If you are age 23 or older, your body mass index should be between 23-30. Your Body mass index is 34.54 kg/m. If this is out of the aforementioned range listed, please consider follow up with your Primary Care Provider.  If you are age 61 or younger, your body mass index should be between 19-25. Your Body mass index is 34.54 kg/m. If this is out of the aformentioned range listed, please consider follow up with your Primary Care Provider.   ________________________________________________________  The [facility] providers would like to encourage you to use [facility] to communicate with providers for non-urgent requests or questions.  Due to long hold times on the telephone, sending your provider a message by [facility] may be a faster and more efficient way to get a response.  Please allow 48 business hours for a response.  Please remember that this is for non-urgent requests.  _______________________________________________________

## 2023-05-03 ENCOUNTER — Encounter: Payer: Medicaid Other | Admitting: Physical Therapy

## 2023-05-10 ENCOUNTER — Encounter: Payer: Medicaid Other | Admitting: Physical Therapy

## 2023-05-24 ENCOUNTER — Encounter: Payer: Medicaid Other | Admitting: Physical Therapy

## 2023-05-31 ENCOUNTER — Telehealth: Payer: Self-pay | Admitting: Physical Therapy

## 2023-05-31 ENCOUNTER — Encounter: Payer: Medicaid Other | Admitting: Physical Therapy

## 2023-05-31 NOTE — Telephone Encounter (Signed)
 Called patient to see if she wants to continue therapy since she had multiple cancellations. She does want to continue and will see her at her next schedule dvisit.  Eulis Foster, PT @4 /1/25@ 8:34 AM

## 2023-06-06 NOTE — Progress Notes (Unsigned)
 [facility] History and Physical   Primary Care Physician:  Fleet Contras, MD   Reason for Procedure:  GERD, left-sided abdominal pain and constipation  Plan:    EGD and colonoscopy   HPI: Amy Church is a 38 y.o. female undergoing upper endoscopy and colonoscopy for evaluation of GERD, left-sided abdominal pain and constipation.  Reports a longstanding history of GERD currently on pantoprazole twice daily.  Despite dose optimize PPI continues to have intermittent reflux symptoms and breakthrough.  Endorses a history of lifelong constipation and left-sided abdominal pain.  Currently on Linzess with improvement.  No prior history of colonoscopy.  No family history of colon polyps or colorectal cancer.   Past Medical History:  Diagnosis Date   Asthma    BMI 40.0-44.9, adult (HCC) 10/23/2018   Chronic constipation    Chronic fungal otitis externa    Chronic hypertension 08/07/2018   Amlodipine 5mg  daily     Diabetes mellitus (HCC)    GERD (gastroesophageal reflux disease)    Gestational diabetes    Hypertension    Did not have during previous pregnancy   Kidney stone    Temporomandibular jaw dysfunction     Past Surgical History:  Procedure Laterality Date   TUBAL LIGATION Bilateral 02/25/2019   Procedure: POST PARTUM TUBAL LIGATION;  Surgeon: Levie Heritage, DO;  Location: [facility] LD [facility]S;  Service: Gynecology;  Laterality: Bilateral;   WISDOM TOOTH EXTRACTION      Prior to Admission medications   Medication Sig Start Date End Date Taking? Authorizing Provider  cyclobenzaprine (FLEXERIL) 5 MG tablet Take 1 tablet (5 mg total) by mouth 3 (three) times daily as needed for muscle spasms. 02/25/23   Selmer Dominion, NP  hydrochlorothiazide (MICROZIDE) 12.5 MG capsule Take by mouth daily. 12/10/22   [provider]  ibuprofen (ADVIL) 600 MG tablet Take 1 tablet (600 mg total) by mouth every 8 (eight) hours as needed for up to 30 doses for fever, headache,  mild pain or moderate pain (Inflammation). Take 1 tablet 3 times daily as needed for inflammation of upper airways and/or pain. 08/01/21   Theadora Rama Scales, PA-C  LINZESS 145 MCG CAPS capsule Take 145 mcg by mouth every morning. 12/15/22   [provider]  naproxen (NAPROSYN) 500 MG tablet Take 1 tablet (500 mg total) by mouth 2 (two) times daily with a meal. 10/20/22   Wallis Bamberg, PA-C  pantoprazole (PROTONIX) 40 MG tablet Take 40 mg by mouth 2 (two) times daily. 12/10/22   [provider]  Peppermint Oil (IBGARD) 90 MG CPCR Take as directed as needed. 04/26/23   McMichael, Saddie Benders, PA-C  tirzepatide [facility]) 10 MG/0.5ML Pen Inject 10 mg into the skin once a week.    [provider]  Trospium Chloride 60 MG CP24 Take 1 capsule (60 mg total) by mouth daily at 6 (six) AM. 12/27/22   Cordelia Pen, Joan Mayans, NP  VENTOLIN HFA 108 (90 Base) MCG/ACT inhaler SMARTSIG:2 Puff(s) By Mouth 4 Times Daily PRN 08/13/22   [provider]    Current Outpatient Medications  Medication Sig Dispense Refill   ibuprofen (ADVIL) 600 MG tablet Take 1 tablet (600 mg total) by mouth every 8 (eight) hours as needed for up to 30 doses for fever, headache, mild pain or moderate pain (Inflammation). Take 1 tablet 3 times daily as needed for inflammation of upper airways and/or pain. 30 tablet 0   LINZESS 145 MCG CAPS capsule Take 145 mcg by mouth every  morning.     Peppermint Oil (IBGARD) 90 MG CPCR Take as directed as needed.     Vitamin D, Ergocalciferol, (DRISDOL) 1.25 MG (50000 UNIT) CAPS capsule Take 1 capsule by mouth once a week.     cyclobenzaprine (FLEXERIL) 5 MG tablet Take 1 tablet (5 mg total) by mouth 3 (three) times daily as needed for muscle spasms. 60 tablet 1   hydrochlorothiazide (MICROZIDE) 12.5 MG capsule Take by mouth daily.     naproxen (NAPROSYN) 500 MG tablet Take 1 tablet (500 mg total) by mouth 2 (two) times daily with a meal. 30 tablet 0   pantoprazole (PROTONIX)  40 MG tablet Take 40 mg by mouth 2 (two) times daily.     tirzepatide (MOUNJARO) 10 MG/0.5ML Pen Inject 10 mg into the skin once a week.     Trospium Chloride 60 MG CP24 Take 1 capsule (60 mg total) by mouth daily at 6 (six) AM. 30 capsule 5   VENTOLIN HFA 108 (90 Base) MCG/ACT inhaler SMARTSIG:2 Puff(s) By Mouth 4 Times Daily PRN     Current Facility-Administered Medications  Medication Dose Route Frequency Provider Last Rate Last Admin   0.9 %  sodium chloride infusion  500 mL Intravenous Once Rosezella Kronick, Durene Romans, MD        Allergies as of 06/07/2023 - Review Complete 06/07/2023  Allergen Reaction Noted   Penicillins Hives, Shortness Of Breath, and Swelling 12/15/2011    Family History  Problem Relation Age of Onset   Cancer Mother        pelvic   Heart disease Paternal Grandmother    Hypertension Paternal Grandmother    Diabetes Paternal Grandmother    Bladder Cancer Neg Hx    Uterine cancer Neg Hx     Social History   Socioeconomic History   Marital status: Single    Spouse name: Not on file   Number of children: Not on file   Years of education: Not on file   Highest education level: Not on file  Occupational History    Comment: unemployed  Tobacco Use   Smoking status: Former    Current packs/day: 0.25    Types: Cigarettes   Smokeless tobacco: Never  Vaping Use   Vaping status: Never Used  Substance and Sexual Activity   Alcohol use: Not Currently   Drug use: Not Currently    Types: Marijuana    Comment: stopped with +UPT   Sexual activity: Not Currently    Birth control/protection: Surgical    Comment: B.T.L  Other Topics Concern   Not on file  Social History Narrative   Not on file   Social Drivers of Health   Financial Resource Strain: Not on file  Food Insecurity: No Food Insecurity (11/29/2022)   Hunger Vital Sign    Worried About Running Out of Food in the Last Year: Never true    Ran Out of Food in the Last Year: Never true  Transportation  Needs: No Transportation Needs (11/29/2022)   PRAPARE - Administrator, Civil Service (Medical): No    Lack of Transportation (Non-Medical): No  Physical Activity: Not on file  Stress: Not on file  Social Connections: Unknown (07/14/2021)   [facility]   Social Network    Social Network: Not on file  Intimate Partner Violence: Unknown (06/05/2021)   Received from [facility], [facility]   HITS    Physically Hurt: Not on file    Insult  or Talk Down To: Not on file    Threaten Physical Harm: Not on file    Scream or Curse: Not on file    Review of Systems:  All other review of systems negative except as mentioned in the HPI.  Physical Exam: Vital signs BP (!) 145/94   Pulse 85   Temp 97.9 F (36.6 C) (Temporal)   Resp 16   Ht 5\' 7"  (1.702 m)   Wt 220 lb (99.8 kg)   SpO2 100%   BMI 34.46 kg/m   General:   Alert,  Well-developed, well-nourished, pleasant and cooperative in NAD Airway:  Mallampati 2 Lungs:  Clear throughout to auscultation.   Heart:  Regular rate and rhythm; no murmurs, clicks, rubs,  or gallops. Abdomen:  Soft, nontender and nondistended. Normal bowel sounds.   Neuro/Psych:  Normal mood and affect. A and O x 3  Maren Beach, MD [facility]

## 2023-06-07 ENCOUNTER — Ambulatory Visit (AMBULATORY_SURGERY_CENTER): Payer: Medicaid Other | Admitting: Pediatrics

## 2023-06-07 ENCOUNTER — Encounter: Payer: Self-pay | Admitting: Pediatrics

## 2023-06-07 ENCOUNTER — Other Ambulatory Visit: Payer: Self-pay | Admitting: Pediatrics

## 2023-06-07 VITALS — BP 116/77 | HR 86 | Temp 97.9°F | Resp 12 | Ht 67.0 in | Wt 220.0 lb

## 2023-06-07 DIAGNOSIS — K449 Diaphragmatic hernia without obstruction or gangrene: Secondary | ICD-10-CM

## 2023-06-07 DIAGNOSIS — D128 Benign neoplasm of rectum: Secondary | ICD-10-CM

## 2023-06-07 DIAGNOSIS — K644 Residual hemorrhoidal skin tags: Secondary | ICD-10-CM | POA: Diagnosis not present

## 2023-06-07 DIAGNOSIS — K648 Other hemorrhoids: Secondary | ICD-10-CM | POA: Diagnosis not present

## 2023-06-07 DIAGNOSIS — K3189 Other diseases of stomach and duodenum: Secondary | ICD-10-CM | POA: Diagnosis not present

## 2023-06-07 DIAGNOSIS — K621 Rectal polyp: Secondary | ICD-10-CM

## 2023-06-07 DIAGNOSIS — K200 Eosinophilic esophagitis: Secondary | ICD-10-CM | POA: Diagnosis not present

## 2023-06-07 DIAGNOSIS — K2289 Other specified disease of esophagus: Secondary | ICD-10-CM | POA: Diagnosis not present

## 2023-06-07 DIAGNOSIS — R1032 Left lower quadrant pain: Secondary | ICD-10-CM

## 2023-06-07 DIAGNOSIS — K219 Gastro-esophageal reflux disease without esophagitis: Secondary | ICD-10-CM

## 2023-06-07 MED ORDER — SODIUM CHLORIDE 0.9 % IV SOLN: 500.0000 mL | Freq: Once | INTRAVENOUS | Status: DC

## 2023-06-07 NOTE — Progress Notes (Signed)
 Vss nad trans to pacu

## 2023-06-07 NOTE — Op Note (Signed)
 [facility] Patient Name: Amy Church Procedure Date: 06/07/2023 9:17 AM MRN: [id number] Endoscopist: Maren Beach , MD, [id number] Age: 38 Referring MD:  Date of Birth: April 08, 1985 Gender: Female Account #: [id number] Procedure:                Upper GI endoscopy Indications:              Abdominal pain in the left upper quadrant,                            Exclusion of gastro-esophageal reflux disease,                            Failure to respond to medical treatment Medicines:                Monitored Anesthesia Care Procedure:                Pre-Anesthesia Assessment:                           - Prior to the procedure, a History and Physical                            was performed, and patient medications and                            allergies were reviewed. The patient's tolerance of                            previous anesthesia was also reviewed. The risks                            and benefits of the procedure and the sedation                            options and risks were discussed with the patient.                            All questions were answered, and informed consent                            was obtained. Prior Anticoagulants: The patient has                            taken no anticoagulant or antiplatelet agents. ASA                            Grade Assessment: III - A patient with severe                            systemic disease. After reviewing the risks and                            benefits, the patient was deemed in satisfactory  condition to undergo the procedure.                           After obtaining informed consent, the endoscope was                            passed under direct vision. Throughout the                            procedure, the patient's blood pressure, pulse, and                            oxygen saturations were monitored continuously. The                            GIF [id number] #[id number]  was introduced through the                            mouth, and advanced to the second part of duodenum.                            The upper GI endoscopy was accomplished without                            difficulty. The patient tolerated the procedure                            well. Scope In: Scope Out: Findings:                 The examined esophagus was normal. Biopsies were                            obtained from the proximal and distal esophagus                            with cold forceps for histology of suspected                            eosinophilic esophagitis.                           The gastric body, gastric antrum, cardia (on                            retroflexion) and gastric fundus (on retroflexion)                            were normal. Biopsies were taken with a cold                            forceps for Helicobacter pylori testing.                           A small hiatal hernia was present.  The duodenal bulb and second portion of the                            duodenum were normal. Complications:            No immediate complications. Estimated blood loss:                            Minimal. Estimated Blood Loss:     Estimated blood loss was minimal. Impression:               - Normal esophagus.                           - Normal gastric body, antrum, cardia and gastric                            fundus. Biopsied.                           - Small hiatal hernia.                           - Normal duodenal bulb and second portion of the                            duodenum.                           - Biopsies were taken with a cold forceps for                            evaluation of eosinophilic esophagitis. Recommendation:           - Await pathology results.                           - Perform a colonoscopy today.                           - The findings and recommendations were discussed                            with the  patient's family.                           - Return to GI clinic in 2 years.                           - Patient has a contact number available for                            emergencies. The signs and symptoms of potential                            delayed complications were discussed with the  patient. Return to normal activities tomorrow.                            Written discharge instructions were provided to the                            patient. Maren Beach, MD 06/07/2023 9:37:02 AM This report has been signed electronically.

## 2023-06-07 NOTE — Progress Notes (Signed)
 Called to room to assist during endoscopic procedure.  Patient ID and intended procedure confirmed with present staff. Received instructions for my participation in the procedure from the performing physician.

## 2023-06-07 NOTE — Progress Notes (Signed)
 Pt's states no medical or surgical changes since previsit or office visit.

## 2023-06-07 NOTE — Op Note (Signed)
 [facility] Patient Name: Amy Church Procedure Date: 06/07/2023 9:15 AM MRN: [id number] Endoscopist: Maren Beach , MD, [id number] Age: 38 Referring MD:  Date of Birth: 09-Nov-1985 Gender: Female Account #: [id number] Procedure:                Colonoscopy Indications:              Abdominal pain in the left upper quadrant,                            Constipation Medicines:                Monitored Anesthesia Care Procedure:                Pre-Anesthesia Assessment:                           - Prior to the procedure, a History and Physical                            was performed, and patient medications and                            allergies were reviewed. The patient's tolerance of                            previous anesthesia was also reviewed. The risks                            and benefits of the procedure and the sedation                            options and risks were discussed with the patient.                            All questions were answered, and informed consent                            was obtained. Prior Anticoagulants: The patient has                            taken no anticoagulant or antiplatelet agents. ASA                            Grade Assessment: III - A patient with severe                            systemic disease. After reviewing the risks and                            benefits, the patient was deemed in satisfactory                            condition to undergo the procedure.  After obtaining informed consent, the colonoscope                            was passed under direct vision. Throughout the                            procedure, the patient's blood pressure, pulse, and                            oxygen saturations were monitored continuously. The                            Olympus Scope [id number] was introduced through the                            anus and advanced to the terminal ileum. The                             colonoscopy was performed without difficulty. The                            patient tolerated the procedure well. The quality                            of the bowel preparation was good. The terminal                            ileum, ileocecal valve, appendiceal orifice, and                            rectum were photographed. Scope In: 9:38:39 AM Scope Out: 9:52:31 AM Scope Withdrawal Time: 0 hours 11 minutes 7 seconds  Total Procedure Duration: 0 hours 13 minutes 52 seconds  Findings:                 Hemorrhoids were found on perianal exam.                           The digital rectal exam was normal. Pertinent                            negatives include normal sphincter tone and no                            palpable rectal lesions.                           Two sessile polyps were found in the rectum. The                            polyps were 4 to 5 mm in size. These polyps were                            removed with a cold snare. Resection and retrieval  were complete.                           The exam was otherwise normal throughout the                            examined colon.                           The terminal ileum appeared normal.                           Internal hemorrhoids were found during retroflexion. Complications:            No immediate complications. Estimated blood loss:                            Minimal. Estimated Blood Loss:     Estimated blood loss was minimal. Impression:               - Hemorrhoids found on perianal exam.                           - Two 4 to 5 mm polyps in the rectum, removed with                            a cold snare. Resected and retrieved.                           - The examined portion of the ileum was normal.                           - Internal hemorrhoids. Recommendation:           - Discharge patient to home (ambulatory).                           - Await pathology  results.                           - Repeat colonoscopy for surveillance based on                            pathology results.                           - The findings and recommendations were discussed                            with the patient's family.                           - Return to GI clinic in 2 months.                           - Patient has a contact number available for  emergencies. The signs and symptoms of potential                            delayed complications were discussed with the                            patient. Return to normal activities tomorrow.                            Written discharge instructions were provided to the                            patient. Maren Beach, MD 06/07/2023 9:58:13 AM This report has been signed electronically.

## 2023-06-07 NOTE — Patient Instructions (Addendum)
 Handouts provided on hiatal hernia Resume previous diet.  Continue present medications.  Await pathology results.  Repeat colonoscopy for surveillance based on pathology results.  Return to GI clinic in 2 months (see appointment details).   YOU HAD AN ENDOSCOPIC PROCEDURE TODAY AT THE [facility]:   Refer to the procedure report that was given to you for any specific questions about what was found during the examination.  If the procedure report does not answer your questions, please call your gastroenterologist to clarify.  If you requested that your care partner not be given the details of your procedure findings, then the procedure report has been included in a sealed envelope for you to review at your convenience later.  YOU SHOULD EXPECT: Some feelings of bloating in the abdomen. Passage of more gas than usual.  Walking can help get rid of the air that was put into your GI tract during the procedure and reduce the bloating. If you had a lower endoscopy (such as a colonoscopy or flexible sigmoidoscopy) you may notice spotting of blood in your stool or on the toilet paper. If you underwent a bowel prep for your procedure, you may not have a normal bowel movement for a few days.  Please Note:  You might notice some irritation and congestion in your nose or some drainage.  This is from the oxygen used during your procedure.  There is no need for concern and it should clear up in a day or so.  SYMPTOMS TO REPORT IMMEDIATELY:  Following lower endoscopy (colonoscopy or flexible sigmoidoscopy):  Excessive amounts of blood in the stool  Significant tenderness or worsening of abdominal pains  Swelling of the abdomen that is new, acute  Fever of 100F or higher  Following upper endoscopy (EGD)  Vomiting of blood or coffee ground material  New chest pain or pain under the shoulder blades  Painful or persistently difficult swallowing  New shortness of breath  Fever of 100F or  higher  Black, tarry-looking stools  For urgent or emergent issues, a gastroenterologist can be reached at any hour by calling [phone]. Do not use MyChart messaging for urgent concerns.    DIET:  We do recommend a small meal at first, but then you may proceed to your regular diet.  Drink plenty of fluids but you should avoid alcoholic beverages for 24 hours.  ACTIVITY:  You should plan to take it easy for the rest of today and you should NOT DRIVE or use heavy machinery until tomorrow (because of the sedation medicines used during the test).    FOLLOW UP: Our staff will call the number listed on your records the next business day following your procedure.  We will call around 7:15- 8:00 am to check on you and address any questions or concerns that you may have regarding the information given to you following your procedure. If we do not reach you, we will leave a message.     If any biopsies were taken you will be contacted by phone or by letter within the next 1-3 weeks.  Please call us at [phone] if you have not heard about the biopsies in 3 weeks.    SIGNATURES/CONFIDENTIALITY: You and/or your care partner have signed paperwork which will be entered into your electronic medical record.  These signatures attest to the fact that that the information above on your After Visit Summary has been reviewed and is understood.  Full responsibility of the confidentiality of this discharge  information lies with you and/or your care-partner.

## 2023-06-08 ENCOUNTER — Telehealth: Payer: Self-pay

## 2023-06-08 NOTE — Telephone Encounter (Signed)
 Left message on answering machine.

## 2023-06-10 ENCOUNTER — Encounter: Payer: Self-pay | Admitting: Pediatrics

## 2023-06-10 LAB — SURGICAL PATHOLOGY

## 2023-06-16 ENCOUNTER — Encounter: Payer: Self-pay | Attending: Obstetrics and Gynecology | Admitting: Physical Therapy

## 2023-06-16 ENCOUNTER — Encounter: Payer: Self-pay | Admitting: Physical Therapy

## 2023-06-16 DIAGNOSIS — R252 Cramp and spasm: Secondary | ICD-10-CM | POA: Diagnosis present

## 2023-06-16 DIAGNOSIS — R102 Pelvic and perineal pain unspecified side: Secondary | ICD-10-CM
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
## 2023-06-23 ENCOUNTER — Encounter: Payer: Self-pay | Admitting: Physical Therapy

## 2023-06-28 ENCOUNTER — Encounter: Payer: Self-pay | Admitting: Physical Therapy

## 2023-07-19 ENCOUNTER — Encounter: Admitting: Physical Therapy

## 2023-08-18 ENCOUNTER — Telehealth: Payer: Self-pay | Admitting: Physical Therapy

## 2023-08-18 ENCOUNTER — Encounter: Payer: Self-pay | Attending: Obstetrics and Gynecology | Admitting: Physical Therapy

## 2023-08-18 DIAGNOSIS — R102 Pelvic and perineal pain: Secondary | ICD-10-CM | POA: Insufficient documentation

## 2023-08-18 DIAGNOSIS — R252 Cramp and spasm: Secondary | ICD-10-CM | POA: Insufficient documentation

## 2023-08-18 NOTE — Telephone Encounter (Signed)
 Called patient about her missed appointment today at 8:30 AM. Unable to leave a message.  Marsha Skeen, PT @6 /19/25@ 9:01 AM

## 2023-08-21 NOTE — Progress Notes (Deleted)
 [facility] Return Visit   Referring Provider Shelda Atlas, MD [address] [place],  [place] [address]  Primary Care Provider Shelda Atlas, MD  Patient Profile: Amy Church is a 38 y.o. female with a past medical history noteworthy for HTN, obesity who returns to the [facility] for follow-up of the problem(s) noted below.  Problem List: Chronic constipation Abdominal pain GERD with elevated eosinophils on esophageal biopsies for 2025 Hemorrhoids  History of Present Illness   Amy Church was last seen in the GI office 04/26/2023 by Nestor Blower, PA   Current GI Meds    Interval History   -- Amy Church underwent EGD/colonoscopy 05/2023 -- EGD - normal, small HH - esophageal biopsies w/ 15-20 eos/HPF in LE, UE -- Colonoscopy - two 4-5 mm polyps in rectum (HP), normal colon, TI, IH/EH -10-year follow-up -- Advised that esophageal biopsies indeterminate between severe GERD versus EOE and recommended continuing on Protonix  Constipation -- Longstanding history of constipation improved on Linzess 145 mcg at the time of last visit -- Given samples of IBgard  GERD -- Pantoprazole 40 mg p.o. twice daily  Last colonoscopy: 05/2023 - normal, small HH - esophageal biopsies w/ 15-20 eos/HPF in LE, UE Last endoscopy: 05/2023 - two 4-5 mm polyps in rectum (HP), normal colon, TI, IH/EH  Last Abd CT/CTE/MRE: CTAP 09/2022-4.5 cm left adnexal cyst.  Normal liver, gallbladder, pancreas, stomach, bowel  GI Review of Symptoms Significant for {GIROS:50592}. Otherwise negative.  General Review of Systems  Review of systems is significant for the pertinent positives and negatives as listed per the HPI.  Full ROS is otherwise negative.  Past Medical History   Past Medical History:  Diagnosis Date   Asthma    BMI 40.0-44.9, adult (HCC) 10/23/2018   Chronic constipation    Chronic fungal otitis externa    Chronic hypertension 08/07/2018    Amlodipine  5mg  daily     Diabetes mellitus (HCC)    GERD (gastroesophageal reflux disease)    Gestational diabetes    Hypertension    Did not have during previous pregnancy   Kidney stone    Temporomandibular jaw dysfunction      Past Surgical History   Past Surgical History:  Procedure Laterality Date   TUBAL LIGATION Bilateral 02/25/2019   Procedure: POST PARTUM TUBAL LIGATION;  Surgeon: Barbra Lang PARAS, DO;  Location: [facility] LD [facility]S;  Service: Gynecology;  Laterality: Bilateral;   WISDOM TOOTH EXTRACTION       Allergies and Medications   Allergies  Allergen Reactions   Penicillins Hives, Shortness Of Breath and Swelling    Has patient had a PCN reaction causing immediate rash, facial/tongue/throat swelling, SOB or lightheadedness with hypotension: yes Has patient had a PCN reaction causing severe rash involving mucus membranes or skin necrosis: no Has patient had a PCN reaction that required hospitalization yes Has patient had a PCN reaction occurring within the last 10 years: yes If all of the above answers are NO, then may proceed with Cephalosporin use.    @MEDSTODAY @  Family His   Family History  Problem Relation Age of Onset   Cancer Mother        pelvic   Heart disease Paternal Grandmother    Hypertension Paternal Grandmother    Diabetes Paternal Grandmother    Bladder Cancer Neg Hx    Uterine cancer Neg Hx    GI Specific Family History: {gifamhx:50061}   Social History   Social History   Tobacco Use  Smoking status: Former    Current packs/day: 0.25    Types: Cigarettes   Smokeless tobacco: Never  Vaping Use   Vaping status: Never Used  Substance Use Topics   Alcohol use: Not Currently   Drug use: Not Currently    Types: Marijuana    Comment: stopped with +UPT   Amira reports that she has quit smoking. Her smoking use included cigarettes. She has never used smokeless tobacco. She reports that she does not currently use alcohol. She reports  that she does not currently use drugs after having used the following drugs: Marijuana.  Vital Signs and Physical Examination   There were no vitals filed for this visit. There is no height or weight on file to calculate BMI.    General: Well developed, well nourished, no acute distress Head: Normocephalic and atraumatic Eyes: Sclerae anicteric, EOMI Ears: Normal auditory acuity Mouth: No deformities or lesions noted Lungs: Clear throughout to auscultation Heart: Regular rate and rhythm; No murmurs, rubs or bruits Abdomen: Soft, non tender and non distended. No masses, hepatosplenomegaly or hernias noted. Normal Bowel sounds Rectal: Musculoskeletal: Symmetrical with no gross deformities  Pulses:  Normal pulses noted Extremities: No edema or deformities noted Neurological: Alert oriented x 4, grossly nonfocal Psychological:  Alert and cooperative. Normal mood and affect   Review of Data   The following data was reviewed at the time of this encounter:   Laboratory Studies      Latest Ref Rng & Units 10/23/2022    2:35 PM 10/21/2022    7:55 PM 05/27/2021    2:40 PM  CBC  WBC 4.0 - 10.5 K/uL 7.1  8.2  8.4   Hemoglobin 12.0 - 15.0 g/dL 86.9  85.5  85.0   Hematocrit 36.0 - 46.0 % 39.6  44.8  45.4   Platelets 150 - 400 K/uL 364  389  428     Lab Results  Component Value Date   LIPASE 28 10/21/2022      Latest Ref Rng & Units 10/23/2022    2:35 PM 10/21/2022    7:55 PM 05/27/2021    2:40 PM  CMP  Glucose 70 - 99 mg/dL 80  88  80   BUN 6 - 20 mg/dL 11  11  7    Creatinine 0.44 - 1.00 mg/dL 9.21  9.27  9.16   Sodium 135 - 145 mmol/L 136  136  138   Potassium 3.5 - 5.1 mmol/L 3.6  3.5  3.7   Chloride 98 - 111 mmol/L 106  102  99   CO2 22 - 32 mmol/L 25  26  29    Calcium 8.9 - 10.3 mg/dL 8.2  9.1  89.9   Total Protein 6.5 - 8.1 g/dL  7.3  8.8   Total Bilirubin 0.3 - 1.2 mg/dL  0.4  0.4   Alkaline Phos 38 - 126 U/L  45  54   AST 15 - 41 U/L  19  24   ALT 0 - 44 U/L  16   21      Imaging Studies  CTAP 09/2022 1. Negative for acute appendicitis. No CT evidence for acute intra-abdominal or pelvic abnormality. 2. 4.5 cm left adnexal cyst. No follow-up imaging recommended. Note: This recommendation does not apply to premenarchal patients and to those with increased risk (genetic, family history, elevated tumor markers or other high-risk factors) of ovarian cancer. Reference: JACR 2020 Feb; 17(2):248-254  GI Procedures and Studies  EGD/Colonoscopy 05/2023 EGD - normal,  small HH - esophageal biopsies w/ 15-20 eos/HPF in LE, UE Colonoscopy - two 4-5 mm polyps in rectum (HP), normal colon, TI, IH/EH   Clinical Impression  It is my clinical impression that Amy Church is a 38 y.o. female with;  ***  Plan  *** *** *** *** ***   Planned Follow Up No follow-ups on file.  The patient or caregiver verbalized understanding of the material covered, with no barriers to understanding. All questions were answered. Patient or caregiver is agreeable with the plan outlined above.    It was a pleasure to see Amy Church.  If you have any questions or concerns regarding this evaluation, do not hesitate to contact me.  Inocente Hausen, MD [facility]

## 2023-08-22 ENCOUNTER — Ambulatory Visit: Admitting: Pediatrics

## 2023-09-22 ENCOUNTER — Encounter (HOSPITAL_BASED_OUTPATIENT_CLINIC_OR_DEPARTMENT_OTHER): Payer: Self-pay

## 2023-09-22 ENCOUNTER — Other Ambulatory Visit: Payer: Self-pay

## 2023-09-22 ENCOUNTER — Emergency Department (HOSPITAL_BASED_OUTPATIENT_CLINIC_OR_DEPARTMENT_OTHER)

## 2023-09-22 ENCOUNTER — Emergency Department (HOSPITAL_BASED_OUTPATIENT_CLINIC_OR_DEPARTMENT_OTHER): Admission: EM | Admit: 2023-09-22 | Discharge: 2023-09-22 | Disposition: A | Discharge status: Home / Self Care

## 2023-09-22 DIAGNOSIS — Y92410 Unspecified street and highway as the place of occurrence of the external cause: Secondary | ICD-10-CM | POA: Insufficient documentation

## 2023-09-22 DIAGNOSIS — M545 Low back pain, unspecified: Secondary | ICD-10-CM | POA: Insufficient documentation

## 2023-09-22 DIAGNOSIS — Z794 Long term (current) use of insulin: Secondary | ICD-10-CM | POA: Insufficient documentation

## 2023-09-22 DIAGNOSIS — M7918 Myalgia, other site: Secondary | ICD-10-CM

## 2023-09-22 DIAGNOSIS — M542 Cervicalgia: Secondary | ICD-10-CM | POA: Insufficient documentation

## 2023-09-22 MED ORDER — KETOROLAC TROMETHAMINE 15 MG/ML IJ SOLN
15.0000 mg | Freq: Once | INTRAMUSCULAR | Status: AC
  Administered 2023-09-22: 15 mg via INTRAMUSCULAR
  Filled 2023-09-22: qty 1

## 2023-09-22 MED ORDER — METHOCARBAMOL 500 MG PO TABS: 500.0000 mg | ORAL_TABLET | Freq: Two times a day (BID) | ORAL | 0 refills | Status: AC

## 2023-09-22 NOTE — ED Notes (Signed)
 RN reviewed discharge instructions with pt. Pt verbalized understanding and had no further questions. VSS upon discharge.
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
## 2023-09-22 NOTE — Discharge Instructions (Signed)
 Follow up with your primary care provider for pain not improving in 3 to 5 days. Take Motrin  and Tylenol  as needed directed for pain.  Take Robaxin  for pain not improving with Motrin  and Tylenol .  Do not drive or operate machinery if taking this medication.
